# Patient Record
Sex: Female | Born: 1950 | Race: White | Hispanic: No | Smoking: Never smoker
Health system: Southern US, Community
[De-identification: ages and names within clinical notes are randomized; demographics above are authoritative.]

## PROBLEM LIST (undated history)

## (undated) DIAGNOSIS — R569 Unspecified convulsions: Secondary | ICD-10-CM

## (undated) DIAGNOSIS — F319 Bipolar disorder, unspecified: Secondary | ICD-10-CM

## (undated) DIAGNOSIS — R4189 Other symptoms and signs involving cognitive functions and awareness: Secondary | ICD-10-CM

## (undated) DIAGNOSIS — E162 Hypoglycemia, unspecified: Secondary | ICD-10-CM

## (undated) DIAGNOSIS — F1020 Alcohol dependence, uncomplicated: Secondary | ICD-10-CM

## (undated) DIAGNOSIS — Z9289 Personal history of other medical treatment: Secondary | ICD-10-CM

## (undated) DIAGNOSIS — J302 Other seasonal allergic rhinitis: Secondary | ICD-10-CM

## (undated) DIAGNOSIS — Z8711 Personal history of peptic ulcer disease: Secondary | ICD-10-CM

## (undated) DIAGNOSIS — F1027 Alcohol dependence with alcohol-induced persisting dementia: Secondary | ICD-10-CM

## (undated) DIAGNOSIS — F32A Depression, unspecified: Secondary | ICD-10-CM

## (undated) DIAGNOSIS — R413 Other amnesia: Secondary | ICD-10-CM

## (undated) DIAGNOSIS — F10239 Alcohol dependence with withdrawal, unspecified: Secondary | ICD-10-CM

## (undated) DIAGNOSIS — Z9889 Other specified postprocedural states: Secondary | ICD-10-CM

## (undated) DIAGNOSIS — F1011 Alcohol abuse, in remission: Secondary | ICD-10-CM

## (undated) DIAGNOSIS — Z8679 Personal history of other diseases of the circulatory system: Secondary | ICD-10-CM

## (undated) DIAGNOSIS — S0990XA Unspecified injury of head, initial encounter: Secondary | ICD-10-CM

## (undated) DIAGNOSIS — F419 Anxiety disorder, unspecified: Secondary | ICD-10-CM

## (undated) DIAGNOSIS — F10231 Alcohol dependence with withdrawal delirium: Secondary | ICD-10-CM

## (undated) DIAGNOSIS — R55 Syncope and collapse: Secondary | ICD-10-CM

## (undated) DIAGNOSIS — I10 Essential (primary) hypertension: Secondary | ICD-10-CM

## (undated) DIAGNOSIS — Z8719 Personal history of other diseases of the digestive system: Secondary | ICD-10-CM

## (undated) DIAGNOSIS — K439 Ventral hernia without obstruction or gangrene: Secondary | ICD-10-CM

## (undated) DIAGNOSIS — E039 Hypothyroidism, unspecified: Secondary | ICD-10-CM

## (undated) DIAGNOSIS — D649 Anemia, unspecified: Secondary | ICD-10-CM

## (undated) DIAGNOSIS — K254 Chronic or unspecified gastric ulcer with hemorrhage: Secondary | ICD-10-CM

## (undated) DIAGNOSIS — I639 Cerebral infarction, unspecified: Secondary | ICD-10-CM

## (undated) DIAGNOSIS — F329 Major depressive disorder, single episode, unspecified: Secondary | ICD-10-CM

## (undated) DIAGNOSIS — S065XAA Traumatic subdural hemorrhage with loss of consciousness status unknown, initial encounter: Secondary | ICD-10-CM

## (undated) HISTORY — PX: CHOLECYSTECTOMY: SHX55

## (undated) HISTORY — PX: TONSILLECTOMY: SUR1361

## (undated) HISTORY — DX: Unspecified convulsions: R56.9

## (undated) HISTORY — DX: Other amnesia: R41.3

## (undated) HISTORY — PX: CHOLECYSTECTOMY OPEN: SUR202

---
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
## 2001-07-03 ENCOUNTER — Emergency Department: Admit: 2001-07-03 | Payer: Self-pay | Source: Emergency Department | Admitting: Emergency Medicine

## 2001-10-03 ENCOUNTER — Ambulatory Visit: Admit: 2001-10-03 | Disposition: A | Discharge status: Home / Self Care | Payer: Self-pay | Source: Ambulatory Visit

## 2002-08-14 ENCOUNTER — Ambulatory Visit
Admit: 2002-08-14 | Disposition: A | Discharge status: Home / Self Care | Payer: Self-pay | Source: Ambulatory Visit | Admitting: Internal Medicine

## 2003-07-29 ENCOUNTER — Ambulatory Visit
Admit: 2003-07-29 | Disposition: A | Discharge status: Home / Self Care | Payer: Self-pay | Source: Ambulatory Visit | Admitting: Internal Medicine

## 2003-08-13 ENCOUNTER — Ambulatory Visit
Admit: 2003-08-13 | Disposition: A | Discharge status: Home / Self Care | Payer: Self-pay | Source: Ambulatory Visit | Admitting: Internal Medicine

## 2004-02-10 ENCOUNTER — Emergency Department: Admit: 2004-02-10 | Payer: Self-pay | Source: Emergency Department

## 2004-02-10 ENCOUNTER — Inpatient Hospital Stay
Admission: EM | Admit: 2004-02-10 | Disposition: A | Discharge status: Home / Self Care | Payer: Self-pay | Source: Emergency Department | Admitting: Hospitalist

## 2005-05-08 DIAGNOSIS — I639 Cerebral infarction, unspecified: Secondary | ICD-10-CM

## 2005-05-08 HISTORY — DX: Cerebral infarction, unspecified: I63.9

## 2008-06-22 ENCOUNTER — Ambulatory Visit
Admit: 2008-06-22 | Disposition: A | Discharge status: Home / Self Care | Payer: Self-pay | Source: Other Acute Inpatient Hospital | Admitting: Hospice and Palliative Medicine

## 2010-05-08 DIAGNOSIS — F10231 Alcohol dependence with withdrawal delirium: Secondary | ICD-10-CM

## 2010-05-08 DIAGNOSIS — F10931 Alcohol use, unspecified with withdrawal delirium: Secondary | ICD-10-CM

## 2010-05-08 HISTORY — DX: Alcohol use, unspecified with withdrawal delirium: F10.931

## 2010-05-08 HISTORY — DX: Alcohol dependence with withdrawal delirium: F10.231

## 2013-07-06 DIAGNOSIS — Z9889 Other specified postprocedural states: Secondary | ICD-10-CM

## 2013-07-06 HISTORY — DX: Other specified postprocedural states: Z98.890

## 2014-06-30 ENCOUNTER — Inpatient Hospital Stay: Payer: 59 | Admitting: Psychiatry

## 2014-06-30 ENCOUNTER — Inpatient Hospital Stay
Admission: RE | Admit: 2014-06-30 | Discharge: 2014-07-21 | DRG: 897 | Disposition: A | Discharge status: Home / Self Care | Payer: 59 | Source: Ambulatory Visit | Attending: Psychiatry | Admitting: Psychiatry

## 2014-06-30 DIAGNOSIS — Z888 Allergy status to other drugs, medicaments and biological substances status: Secondary | ICD-10-CM

## 2014-06-30 DIAGNOSIS — G4709 Other insomnia: Secondary | ICD-10-CM

## 2014-06-30 DIAGNOSIS — W1830XA Fall on same level, unspecified, initial encounter: Secondary | ICD-10-CM | POA: Diagnosis present

## 2014-06-30 DIAGNOSIS — R4182 Altered mental status, unspecified: Secondary | ICD-10-CM

## 2014-06-30 DIAGNOSIS — K5900 Constipation, unspecified: Secondary | ICD-10-CM

## 2014-06-30 DIAGNOSIS — E663 Overweight: Secondary | ICD-10-CM | POA: Diagnosis present

## 2014-06-30 DIAGNOSIS — F419 Anxiety disorder, unspecified: Secondary | ICD-10-CM | POA: Diagnosis present

## 2014-06-30 DIAGNOSIS — Y939 Activity, unspecified: Secondary | ICD-10-CM

## 2014-06-30 DIAGNOSIS — R52 Pain, unspecified: Secondary | ICD-10-CM

## 2014-06-30 DIAGNOSIS — Z6827 Body mass index (BMI) 27.0-27.9, adult: Secondary | ICD-10-CM

## 2014-06-30 DIAGNOSIS — Z23 Encounter for immunization: Secondary | ICD-10-CM

## 2014-06-30 DIAGNOSIS — Z87891 Personal history of nicotine dependence: Secondary | ICD-10-CM

## 2014-06-30 DIAGNOSIS — Y999 Unspecified external cause status: Secondary | ICD-10-CM

## 2014-06-30 DIAGNOSIS — G9389 Other specified disorders of brain: Secondary | ICD-10-CM | POA: Diagnosis present

## 2014-06-30 DIAGNOSIS — F10231 Alcohol dependence with withdrawal delirium: Principal | ICD-10-CM | POA: Diagnosis present

## 2014-06-30 DIAGNOSIS — Z8673 Personal history of transient ischemic attack (TIA), and cerebral infarction without residual deficits: Secondary | ICD-10-CM

## 2014-06-30 DIAGNOSIS — R112 Nausea with vomiting, unspecified: Secondary | ICD-10-CM

## 2014-06-30 DIAGNOSIS — G40909 Epilepsy, unspecified, not intractable, without status epilepticus: Secondary | ICD-10-CM | POA: Diagnosis present

## 2014-06-30 DIAGNOSIS — R6510 Systemic inflammatory response syndrome (SIRS) of non-infectious origin without acute organ dysfunction: Secondary | ICD-10-CM | POA: Diagnosis present

## 2014-06-30 DIAGNOSIS — F09 Unspecified mental disorder due to known physiological condition: Secondary | ICD-10-CM | POA: Diagnosis present

## 2014-06-30 DIAGNOSIS — S301XXA Contusion of abdominal wall, initial encounter: Secondary | ICD-10-CM | POA: Diagnosis present

## 2014-06-30 DIAGNOSIS — F0280 Dementia in other diseases classified elsewhere without behavioral disturbance: Secondary | ICD-10-CM | POA: Diagnosis present

## 2014-06-30 DIAGNOSIS — F32A Depression, unspecified: Secondary | ICD-10-CM

## 2014-06-30 DIAGNOSIS — I10 Essential (primary) hypertension: Secondary | ICD-10-CM | POA: Diagnosis present

## 2014-06-30 DIAGNOSIS — Z9181 History of falling: Secondary | ICD-10-CM

## 2014-06-30 DIAGNOSIS — F1027 Alcohol dependence with alcohol-induced persisting dementia: Secondary | ICD-10-CM | POA: Diagnosis present

## 2014-06-30 DIAGNOSIS — F319 Bipolar disorder, unspecified: Secondary | ICD-10-CM | POA: Diagnosis present

## 2014-06-30 DIAGNOSIS — F29 Unspecified psychosis not due to a substance or known physiological condition: Secondary | ICD-10-CM | POA: Diagnosis present

## 2014-06-30 DIAGNOSIS — S51812A Laceration without foreign body of left forearm, initial encounter: Secondary | ICD-10-CM | POA: Diagnosis present

## 2014-06-30 DIAGNOSIS — IMO0002 Reserved for concepts with insufficient information to code with codable children: Secondary | ICD-10-CM

## 2014-06-30 DIAGNOSIS — T148XXA Other injury of unspecified body region, initial encounter: Secondary | ICD-10-CM | POA: Diagnosis present

## 2014-06-30 DIAGNOSIS — R197 Diarrhea, unspecified: Secondary | ICD-10-CM

## 2014-06-30 DIAGNOSIS — R29818 Other symptoms and signs involving the nervous system: Secondary | ICD-10-CM

## 2014-06-30 DIAGNOSIS — G4700 Insomnia, unspecified: Secondary | ICD-10-CM | POA: Diagnosis present

## 2014-06-30 DIAGNOSIS — R569 Unspecified convulsions: Secondary | ICD-10-CM | POA: Diagnosis present

## 2014-06-30 DIAGNOSIS — R451 Restlessness and agitation: Secondary | ICD-10-CM | POA: Insufficient documentation

## 2014-06-30 MED ORDER — ESCITALOPRAM OXALATE 20 MG PO TABS
20.0000 mg | ORAL_TABLET | Freq: Every day | ORAL | Status: DC
Start: 2014-07-01 — End: 2014-07-06
  Administered 2014-07-01 – 2014-07-06 (×6): 20 mg via ORAL
  Filled 2014-06-30 (×6): qty 1

## 2014-06-30 MED ORDER — LORAZEPAM 0.5 MG PO TABS
0.5000 mg | ORAL_TABLET | ORAL | Status: DC | PRN
Start: 2014-06-30 — End: 2014-07-21
  Administered 2014-07-07 – 2014-07-21 (×13): 0.5 mg via ORAL
  Filled 2014-06-30 (×13): qty 1

## 2014-06-30 MED ORDER — ACETAMINOPHEN 325 MG PO TABS
650.0000 mg | ORAL_TABLET | Freq: Four times a day (QID) | ORAL | Status: DC | PRN
Start: 2014-06-30 — End: 2014-07-21
  Administered 2014-07-04 – 2014-07-20 (×8): 650 mg via ORAL
  Filled 2014-06-30 (×9): qty 2

## 2014-06-30 MED ORDER — HALOPERIDOL 5 MG PO TABS
5.0000 mg | ORAL_TABLET | ORAL | Status: DC | PRN
Start: 2014-06-30 — End: 2014-07-21
  Administered 2014-07-13: 5 mg via ORAL
  Filled 2014-06-30: qty 1

## 2014-06-30 MED ORDER — TRAZODONE HCL 50 MG PO TABS
50.0000 mg | ORAL_TABLET | Freq: Every evening | ORAL | Status: DC | PRN
Start: 2014-06-30 — End: 2014-07-19
  Administered 2014-07-01 – 2014-07-18 (×12): 50 mg via ORAL
  Filled 2014-06-30 (×12): qty 1

## 2014-06-30 MED ORDER — ZIPRASIDONE MESYLATE 20 MG IM SOLR
20.0000 mg | Freq: Once | INTRAMUSCULAR | Status: DC | PRN
Start: 2014-06-30 — End: 2014-07-21

## 2014-06-30 MED ORDER — HALOPERIDOL LACTATE 5 MG/ML IJ SOLN
5.0000 mg | INTRAMUSCULAR | Status: DC | PRN
Start: 2014-06-30 — End: 2014-07-21
  Administered 2014-06-30: 5 mg via INTRAMUSCULAR
  Filled 2014-06-30: qty 1

## 2014-06-30 MED ORDER — BENZTROPINE MESYLATE 1 MG/ML IJ SOLN
1.0000 mg | INTRAMUSCULAR | Status: DC | PRN
Start: 2014-06-30 — End: 2014-07-21

## 2014-06-30 NOTE — Psych Admission Note (Signed)
PT ARRIVED ESCORTED TO UNIT  BY  POLICE FROM [facility]. PT HAD BEEN  IN PT THERE SINCE 2/16 UNTIL TODAY FOR DELI RUM TREMORS . SHE HAS HISTORY OF ETOH ABUSE , BIPOLAR. HYPERTENSION AND SIRS. PT WAS FOUND WANDERING AROUND HER HOME IN [place] NAKED, INTOXICATED AND SHE HAS AN 6 INCH HEALING WOUND ON LEFT ARM IT IS SCABBED OVER DRY NO REDNESS OR SWELLING. DRY SCARS AND SCABS ON BOTH KNEES WITH BRUISES ON BOTH LEGS . SHE DOES NOT C/O PAIN. SHE IS DISOR INTED ONLY TO SELF/\. BUT SHE CAN RECOUNT DETAILS AROUND ADMISSION TO [facility]. SHE DOES HAVE DIFFICULTY REMEMBERING WHERE HER ROOM IS APPETITE VERY GOOD TAKING FLUIDS WELL.

## 2014-07-01 ENCOUNTER — Encounter: Payer: Self-pay | Admitting: Family

## 2014-07-01 ENCOUNTER — Inpatient Hospital Stay: Payer: 59

## 2014-07-01 DIAGNOSIS — R569 Unspecified convulsions: Secondary | ICD-10-CM | POA: Diagnosis present

## 2014-07-01 DIAGNOSIS — T148XXA Other injury of unspecified body region, initial encounter: Secondary | ICD-10-CM | POA: Diagnosis present

## 2014-07-01 DIAGNOSIS — R451 Restlessness and agitation: Secondary | ICD-10-CM | POA: Insufficient documentation

## 2014-07-01 LAB — CBC AND DIFFERENTIAL
Basophils Absolute Automated: 0.05 10*3/uL (ref 0.00–0.20)
Basophils Automated: 1 %
Eosinophils Absolute Automated: 0.17 10*3/uL (ref 0.00–0.70)
Eosinophils Automated: 3 %
Hematocrit: 38.1 % (ref 37.0–47.0)
Hgb: 12.8 g/dL (ref 12.0–16.0)
Lymphocytes Absolute Automated: 0.86 10*3/uL (ref 0.50–4.40)
Lymphocytes Automated: 16 %
MCH: 34.6 pg — ABNORMAL HIGH (ref 28.0–32.0)
MCHC: 33.6 g/dL (ref 32.0–36.0)
MCV: 103 fL — ABNORMAL HIGH (ref 80.0–100.0)
MPV: 10.3 fL (ref 9.4–12.3)
Monocytes Absolute Automated: 0.9 10*3/uL (ref 0.00–1.20)
Monocytes: 17 %
Neutrophils Absolute: 3.4 10*3/uL (ref 1.80–8.10)
Neutrophils: 63 %
Platelets: 290 10*3/uL (ref 140–400)
RBC: 3.7 10*6/uL — ABNORMAL LOW (ref 4.20–5.40)
RDW: 12 % (ref 12–15)
WBC: 5.38 10*3/uL (ref 3.50–10.80)

## 2014-07-01 LAB — COMPREHENSIVE METABOLIC PANEL
ALT: 42 U/L (ref 0–55)
AST (SGOT): 36 U/L — ABNORMAL HIGH (ref 5–34)
Albumin/Globulin Ratio: 1.3 (ref 0.9–2.2)
Albumin: 4.1 g/dL (ref 3.5–5.0)
Alkaline Phosphatase: 52 U/L (ref 37–106)
Anion Gap: 10 (ref 5.0–15.0)
BUN: 13 mg/dL (ref 7.0–19.0)
Bilirubin, Total: 0.3 mg/dL (ref 0.2–1.2)
CO2: 24 mEq/L (ref 22–29)
Calcium: 10.3 mg/dL (ref 8.5–10.5)
Chloride: 106 mEq/L (ref 100–111)
Creatinine: 0.8 mg/dL (ref 0.6–1.0)
Globulin: 3.2 g/dL (ref 2.0–3.6)
Glucose: 99 mg/dL (ref 70–100)
Potassium: 4.2 mEq/L (ref 3.5–5.1)
Protein, Total: 7.3 g/dL (ref 6.0–8.3)
Sodium: 140 mEq/L (ref 136–145)

## 2014-07-01 LAB — GFR: EGFR: >60

## 2014-07-01 MED ORDER — FOLIC ACID 1 MG PO TABS
1.0000 mg | ORAL_TABLET | Freq: Every day | ORAL | Status: DC
Start: 2014-07-03 — End: 2014-07-21
  Administered 2014-07-03 – 2014-07-21 (×19): 1 mg via ORAL
  Filled 2014-07-01 (×19): qty 1

## 2014-07-01 MED ORDER — LEVETIRACETAM 500 MG PO TABS
500.0000 mg | ORAL_TABLET | Freq: Two times a day (BID) | ORAL | Status: DC
Start: 2014-07-01 — End: 2014-07-21
  Administered 2014-07-01 – 2014-07-21 (×41): 500 mg via ORAL
  Filled 2014-07-01 (×41): qty 1

## 2014-07-01 MED ORDER — TETANUS-DIPHTH-ACELL PERTUSSIS 5-2.5-18.5 LF-MCG/0.5 IM SUSP
0.5000 mL | INTRAMUSCULAR | Status: AC | PRN
Start: 2014-07-01 — End: 2014-07-20
  Administered 2014-07-20: 0.5 mL via INTRAMUSCULAR
  Filled 2014-07-01: qty 0.5

## 2014-07-01 MED ORDER — TAB-A-VITE/BETA CAROTENE PO TABS
1.0000 | ORAL_TABLET | Freq: Every day | ORAL | Status: DC
Start: 2014-07-03 — End: 2014-07-21
  Administered 2014-07-03 – 2014-07-21 (×19): 1 via ORAL
  Filled 2014-07-01 (×19): qty 1

## 2014-07-01 MED ORDER — THIAMINE HCL 100 MG PO TABS
100.0000 mg | ORAL_TABLET | Freq: Every day | ORAL | Status: DC
Start: 2014-07-03 — End: 2014-07-21
  Administered 2014-07-03 – 2014-07-21 (×19): 100 mg via ORAL
  Filled 2014-07-01 (×19): qty 1

## 2014-07-01 MED ORDER — MUPIROCIN 2 % EX OINT
TOPICAL_OINTMENT | Freq: Three times a day (TID) | CUTANEOUS | Status: DC
Start: 2014-07-01 — End: 2014-07-04
  Filled 2014-07-01: qty 22

## 2014-07-01 NOTE — Consults (Signed)
ADMISSION HISTORY AND PHYSICAL EXAM    Date Time: 07/01/2014 1:22 PM  Patient Name: Virginia Reid  Attending Physician: Roxy Manns, MD        CC   Medical consult for LAMPS admission        Assessment / Plan:   -AMS admitted to [facility] from [facility]; review of medical records; patient naked in the cold weather.  Patient has no memory of this; check head ct, labs; no gross neuro deficits however cognitively her thought process is impaired.  Check rpr, vit Reid, folate.  Cbc/chem panel pending  -s/p fall patient remembers falling outside at same point with linear cut to left FA now with escar tissue present.  No active d/c, red streaking, odor.  Wash daily and apply bactroban.  Tetanus updated.  -seizure history; no clear etiology con't keppra bid  -reported history of alcohol abuse in past; vitamins, folic and thiamine.    -qtc prolongation at 467; caution with meds interfering with qtc, check k/mg level.     I spoke briefly with son, dniya neuhaus today at [phone] however he was not able to talk 2/2 being in a meeting; will call back later.           History of Present Illness:   Virginia Reid is a 64 y.o. female taken to a [facility] and transferred to [facility] for further eval and management.  All the facts are not clear; hx alcohol issues on keppra.  Pt reports sz years ago not related to alcohol. In discussion she reprots feeling sad 2/2 death of her twin sister recently however she did not know where she lived or the last time she saw her sister.      ROS: limited as above; no cp, sob, gait disturbance.      Past Medical History:   History reviewed. No pertinent past medical history.    Past Surgical History:   History reviewed. No pertinent past surgical history.    Family History:   History reviewed. No pertinent family history.    Social History:     History     Social History   . Marital Status: Widowed     Spouse Name: N/A     Number of Children: N/A   . Years of Education: N/A      Social History Main Topics   . Smoking status: Former Games developer   . Smokeless tobacco: Not on file   . Alcohol Use: Yes   . Drug Use: No   . Sexual Activity: Not on file     Other Topics Concern   . Not on file     Social History Narrative   . No narrative on file       Allergies:     Allergies   Allergen Reactions   . Zoloft [Sertraline] Anxiety       Medications:   There are no discharge medications for this patient.      No prescriptions prior to admission         Physical Exam:     Filed Vitals:    07/01/14 0606   BP: 113/77   Pulse: 84   Temp: 97.7 F (36.5 C)   Resp:    SpO2: 98%       Intake and Output Summary (Last 24 hours) at Date Time  No intake or output data in the 24 hours ending 07/01/14 1322    General appearance - alert, talkative female nad  Mental status - alert, oriented to person, place, and time  Eyes - pupils equal and reactive,  sclera anicteric  Nose - normal and patent, no erythema, discharge or polyps  Mouth - mucous membranes moist, pharynx normal without lesions   Neck - supple, no significant adenopathy,  no bruits, thyroid exam: thyroid is normal in size without nodules or tenderness,  Chest - clear to auscultation, no wheezes, rales or rhonchi, symmetric air entry  Heart - normal rate, regular rhythm, normal S1, S2  Abdomen - soft, nontender, nondistended, +BS  Back exam -  no tenderness, palpable spasm or pain on motion  Neurological - normal speech, no focal findings or movement disorder noted,  motor and sensory grossly normal bilaterally , no tremors, strength 5/5  Musculoskeletal - no joint tenderness, deformity or swelling, full range of motion without pain  Extremities - peripheral pulses normal, no pedal edema  Skin - linear cut with escar tissue now approx 15 cm to left fa      Labs:     Results     Procedure Component Value Units Date/Time    CBC and differential [[id number]]  (Abnormal) Collected:  07/01/14 1118    Specimen Information:  Blood / Blood Updated:  07/01/14  1211     WBC 5.38 x10 3/uL      Hgb 12.8 g/dL      Hematocrit 40.9 %      Platelets 290 x10 3/uL      RBC 3.70 (L) x10 6/uL      MCV 103.0 (H) fL      MCH 34.6 (H) pg      MCHC 33.6 g/dL      RDW 12 %      MPV 10.3 fL      Neutrophils 63 %      Lymphocytes Automated 16 %      Monocytes 17 %      Eosinophils Automated 3 %      Basophils Automated 1 %      Immature Granulocyte Unmeasured %      Nucleated RBC Unmeasured /100 WBC      Neutrophils Absolute 3.40 x10 3/uL      Abs Lymph Automated 0.86 x10 3/uL      Abs Mono Automated 0.90 x10 3/uL      Abs Eos Automated 0.17 x10 3/uL      Absolute Baso Automated 0.05 x10 3/uL      Absolute Immature Granulocyte Unmeasured x10 3/uL     Comprehensive metabolic panel [[id number]]  (Abnormal) Collected:  07/01/14 1118    Specimen Information:  Blood Updated:  07/01/14 1152     Glucose 99 mg/dL      BUN 95.6 mg/dL      Creatinine 0.8 mg/dL      Sodium 213 mEq/L      Potassium 4.2 mEq/L      Chloride 106 mEq/L      CO2 24 mEq/L      CALCIUM 10.3 mg/dL      Protein, Total 7.3 g/dL      Albumin 4.1 g/dL      AST (SGOT) 36 (H) U/L      ALT 42 U/L      Alkaline Phosphatase 52 U/L      Bilirubin, Total 0.3 mg/dL      Globulin 3.2 g/dL      Albumin/Globulin Ratio 1.3      Anion Gap 10.0     GFR [[id number]]  Collected:  07/01/14 1118     EGFR >60.0 Updated:  07/01/14 1152          EKG sr with qtc 467    Rads:   Radiological Procedure reviewed.    Signed by: Delle Reining NP  07/01/2014 1:22 PM

## 2014-07-01 NOTE — Plan of Care (Signed)
Problem: Safety  Goal: Patient will be free from injury during hospitalization  Intervention: Provide and maintain safe environment  Patient remained in bed through the early evening, after IM Haldol was given for her restlessness, and confusion.  Patient was calm, slightly guarded on approach.  Refused snacks, stated that she did eat her evening dinner tray.  She was alert to name only, unable to state her location, but did tell me she was from [facility], and lives alone.  Denies drinking prior to admission, although hospitalized for Alcohol Intoxication at [facility] prior to arrival for Inpatient psych tx.  Remained quiet, in bed.  Denies SI/HI, and denying hallucinations.  Refused sleep aids prn.  Will continue to monitor her safety, and progression.  Patient appeared to sleep 9 hours.

## 2014-07-01 NOTE — Psych Admission Note (Signed)
Patient Type: I     ATTENDING PHYSICIAN: Alanda Amass, MD     CHIEF COMPLAINT:  Continued delirium following delirium tremens.     HISTORY OF PRESENT ILLNESS:  The patient is a 64 year old widowed female escorted to the LAMPS unit by  police from [facility].  The patient had been admitted to the  inpatient facility since June 23, 2014 because of delirium tremens.   She has a history of alcohol abuse, bipolar disorder, hypertension, and  SIRS.  She was found wandering around her home in [place] naked,  intoxicated.  At the time of admission, she was disoriented.  She was  oriented only to self.  She was able to recount details surrounding the  admission to [facility].  The patient was admitted to the LAMPS unit  with minimal information.  She was cooperative throughout the evening  shift.  When I saw the patient on the morning of July 01, 2013.  She  was pleasant and appeared calm and comfortable.  She explained she had come  from [facility], which is apparently another name for [facility].  She explained that she was taken to the [facility] because  she was walking around her house and someone called the hospital.  She was  there for 1 to 2 days and did not know why she was here.  She expressed  that she was somewhere in [facility].  She did not know this was  a psychiatric unit.  She thought the month was October, the season fall,  the year 2015.  She explained that she lives in [place] at [address], [address].  She explained she has never been in a psychiatric hospital  before.       Regarding current symptoms, she explained that she usually sleeps pretty  well.  Sometimes she has a little trouble falling asleep and wakes up  occasionally during the night.  She denies feeling depression,  hallucinations, delusions, paranoia.  Her mood occasionally changes but not  rapidly.  She feels she is able to stay focused.  She did explain that her  twin sister had  died within the past week and she has not had a chance to  see the body.      PAST PSYCHIATRIC HISTORY:  She denies.       HISTORY OF SUBSTANCE USE AND ABUSE:    She admits to using a large amount of alcohol in the past, though she would  not be specific about how much it is.  She explained that currently she  drinks beer and wine, maybe 1 glass of wine and 1 glass of beer.  She noted  she drinks a lot less than she used to.  She denied use of illicit drugs.     PAST MEDICAL HISTORY:  She denies.     PAST SURGICAL HISTORY:  She denies.     SOCIAL HISTORY:  She explained that her husband died 58 to 15 years ago; he committed  suicide.  Following that, she decided that she would get her doctorate in  behavioral disturbances.  She got that at [facility] about 10 years ago.   She has a master's degrees from [employer].  According to a medical  record that came with her, she has functioned as principal in the past.   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  She  currently lives by herself.  She thinks she supports herself from money  from her husband and plans to get a job after discharge.  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  She expressed she has a son, Mardelle Matte.  The phone number she gave for  Mardelle Matte is [phone].  When this number was checked it was found to be no  longer operating.  The correct number for Mardelle Matte is [phone].  She also  has a son, Ivin Booty, who is in [place].     MEDICAL HISTORY:  Negative for medical workup at [facility]; it did not come with the  patient today.  We do not have that information.     PHYSICAL EXAMINATION:  VITAL SIGNS:  See RN assessment reviewed by me.  MUSCULOSKELETAL:  The patient walked very well.  She demonstrated full  range of motion with no choreoathetoid movements, akathisia or tics.     MENTAL STATUS EXAMINATION:  She presented as a well-developed, well-nourished  woman who appeared older  than her stated age.  She was fairly well groomed and demonstrated fair  hygiene.   She was alert, cooperative, made intermittent eye contact and  seemed to maintain interest in the interview.  She demonstrated no  psychomotor agitation, some retardation, no movement problems.  Her speech  was rambling and tangential.  Her affect was somewhat empty.  There was an  emptiness even when she spoke about the loss of her sister.  She did not  seem concerned about her confusion regarding where she is or the death of  her sister.  She denied hallucinations, delusions, paranoia.  She did not  appear to be responding to stimuli, but she did appear confused but not too  troubled by that.  Insight, motivation, and judgment, use of language, fund  of knowledge, difficult to assess.       ASSESSMENT:   AXIS I:  Unspecified neurocognitive disorder, history of unspecified  bipolar and related disorder, history of unspecified alcohol-related  disorder with recent delirium tremens.  AXIS II:  Deferred.  AXIS III:  We are attempting to get information regarding this.  See HPI  from Dimas Aguas, nurse practitioner.  AXIS IV:  Problems with chronic psychiatric illness, altered mental status,  history of alcohol abuse and delirium tremens, lack of social support,  confusion.  AXIS V:  Global assessment of functioning of 15.      PLAN:  The patient is admitted to the [place] LAMPS unit on a TDO for basis for  stabilization of mood, diagnostic assessment and safety.  The patient will  participate in the supportive therapeutic milieu.  A medical consult will  be obtained to address any acute or chronic medical issues.  We will  monitor the patient carefully before initiating standing doses of  psychotropic medication.  The patient will meet with case management team  to prepare a safe discharge.     Expected length of stay is 3 to 5 days.     Total time spent with patient 50 minutes of which at least half was  spent  in the coordination of care and counseling, including talking with the  patient, reviewing records, and talking with staff.           D:  07/01/2014 19:01 PM by Dr. Harriett Sine C. Franchot Erichsen, MD ([id number])  T:  07/01/2014 20:04 PM by NTS      Everlean Cherry: [id number]) (Doc ID:  [id number])

## 2014-07-01 NOTE — Plan of Care (Signed)
Problem: Psychosocial and Spiritual Needs  Goal: Demonstrates ability to cope with hospitalization/illness  Outcome: Progressing  Intervention: Encourage verbalization of feelings/concerns/expectations  Patient presented cooperative, with stable mood, smiling. Steady gate. Independent. Denied SI/HI/AVH. Stated that she wishes she could go home .attended groups, per therapist , she had disorganized thought process. Took shower in the am, alternating between her room and lobby, social with peers. Took medication with no difficulty. Will continue to monitor.    No PRN medication requested or given.

## 2014-07-02 LAB — FOLATE: Folate: 17 ng/mL

## 2014-07-02 LAB — VITAMIN B12: Vitamin B-12: 638 pg/mL (ref 211–911)

## 2014-07-02 LAB — MAGNESIUM: Magnesium: 2.5 mg/dL (ref 1.6–2.6)

## 2014-07-02 LAB — HEMOLYSIS INDEX: Hemolysis Index: 5 (ref 0–18)

## 2014-07-02 NOTE — Progress Notes (Signed)
Pt attended all groups during the day. Her affect was blunted; thought process was WNL, at times confused. She kept introducing self to the therapist.    During the creative therapy. Pt remained attentive. She accomplished the group task independently, contributed to the group discussion, and provided feedback.

## 2014-07-02 NOTE — Progress Notes (Signed)
Virginia Reid CSN:[id number],MRN:[id number] is a 64 y.o. female,  Outpatient Primary MD for the patient is No primary care provider on file.   No chief complaint on file.     Assessment & Plan     -AMS head ct no acute issues.  Labs overall negative.  Awaiting rpr, vit, folate.  Telephone call with son, Virginia Reid, reported POA.  Hx chronic drinking, passing out, falling, hitting head, hx tia in past.  Overall memory issues noticed about 2 years ago; last 6-9 month more severe.  Seen by neurology; concern for dementia.  Reports 15 hospital admissions in the last 18 months all around alcoholism.  -chronic alcoholism with hx dt; seen neuro at [facility]; mvi, folate. con't keppra    ?inability to care for self; at present living by self.  Her prior poa, twin sister, deceased recently.  Son Virginia Reid he is now poa; will  check paperwork.         Subjective:   Virginia Reid today has, No headache, No chest pain, No abdominal pain - No Nausea, No new weakness tingling or numbness, No Cough - SOB. "I am ready to go home"  Objective:     Filed Vitals:    06/30/14 1917 07/01/14 0606 07/02/14 0618   BP: 135/88 113/77 110/67   Pulse: 97 84 83   Temp: 96.8 F (36 C) 97.7 F (36.5 C) 98.2 F (36.8 C)   TempSrc: Temporal Artery Oral Oral   Resp: 16     Height: 1.549 m (5' 0.98")     Weight: 66.679 kg (147 lb)     SpO2: 98% 98% 97%     Wt Readings from Last 3 Encounters:   06/30/14 66.679 kg (147 lb)     No intake or output data in the 24 hours ending 07/02/14 0913  Exam  Awake Alert, Oriented x 3   HEENT nl  Lungs CTA  s1s2 no murmur  +ve B.Sounds, Abd Soft, Non tender , No rebound -guarding or rigidity.  No pedal edema  Left FA cut healing ; no secondary infection   Neuro grossly intact     Data Review     Results     Procedure Component Value Units Date/Time    Magnesium [[id number]] Collected:  07/02/14 0550    Specimen Information:  Blood Updated:  07/02/14 0631     Magnesium 2.5 mg/dL     Folate [[id number]] Collected:   07/02/14 0550    Specimen Information:  Blood Updated:  07/02/14 0550    Vitamin B12 [[id number]] Collected:  07/02/14 0550    Specimen Information:  Blood Updated:  07/02/14 0550    RPR [[id number]] Collected:  07/02/14 0550    Specimen Information:  Blood Updated:  07/02/14 0550    CBC and differential [[id number]]  (Abnormal) Collected:  07/01/14 1118    Specimen Information:  Blood / Blood Updated:  07/01/14 1211     WBC 5.38 x10 3/uL      Hgb 12.8 g/dL      Hematocrit 24.4 %      Platelets 290 x10 3/uL      RBC 3.70 (L) x10 6/uL      MCV 103.0 (H) fL      MCH 34.6 (H) pg      MCHC 33.6 g/dL      RDW 12 %      MPV 10.3 fL      Neutrophils 63 %      Lymphocytes Automated 16 %  Monocytes 17 %      Eosinophils Automated 3 %      Basophils Automated 1 %      Immature Granulocyte Unmeasured %      Nucleated RBC Unmeasured /100 WBC      Neutrophils Absolute 3.40 x10 3/uL      Abs Lymph Automated 0.86 x10 3/uL      Abs Mono Automated 0.90 x10 3/uL      Abs Eos Automated 0.17 x10 3/uL      Absolute Baso Automated 0.05 x10 3/uL      Absolute Immature Granulocyte Unmeasured x10 3/uL     Comprehensive metabolic panel [[id number]]  (Abnormal) Collected:  07/01/14 1118    Specimen Information:  Blood Updated:  07/01/14 1152     Glucose 99 mg/dL      BUN 11.9 mg/dL      Creatinine 0.8 mg/dL      Sodium 147 mEq/L      Potassium 4.2 mEq/L      Chloride 106 mEq/L      CO2 24 mEq/L      CALCIUM 10.3 mg/dL      Protein, Total 7.3 g/dL      Albumin 4.1 g/dL      AST (SGOT) 36 (H) U/L      ALT 42 U/L      Alkaline Phosphatase 52 U/L      Bilirubin, Total 0.3 mg/dL      Globulin 3.2 g/dL      Albumin/Globulin Ratio 1.3      Anion Gap 10.0     GFR [[id number]] Collected:  07/01/14 1118     EGFR >60.0 Updated:  07/01/14 1152         ------------------------------------------------------------------------------------------------------------------        Cardiac Enzymes       ------------------------------------------------------------------------------------------------------------------      Micro Results  @[email or web address] @  Radiology Reports  Ct Head Wo Contrast    07/01/2014   History: Altered mental status.  FINDINGS: Brain CT without intravenous contrast. Correlation with a brain MRI dated February 11, 2004.  There is again diffuse parenchymal volume loss. There is ventriculomegaly which is slightly out of proportion to volume loss. The ventriculomegaly involving also have slightly progressed since the prior examination. There is hypodensity in the supratentorial white matter consistent with moderate chronic small vessel ischemic disease, also slightly more prominent than previously. There is again hypodensity in the subinsular regions, particularly on the left, chronic small vessel ischemic in nature as well. There is no mass effect, acute intracranial hemorrhage. The ventricular system and cisterns are normally configured. The visualized paranasal sinuses and calvarium are unremarkable.     07/01/2014    1. 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 2. Volume loss, ventriculomegaly which is slightly out of proportion to volume loss. This has progressed since the prior examination.  Terrilee Croak, MD  07/01/2014 4:36 PM     Scheduled Meds:  Current Facility-Administered Medications   Medication Dose Route Frequency   . escitalopram  20 mg Oral Daily   . [START ON 07/03/2014] folic acid  1 mg Oral Daily   . levETIRAcetam  500 mg Oral Q12H SCH   . [START ON 07/03/2014] multivitamin  1 tablet Oral Daily   . mupirocin   Topical Q8H SCH   . [START ON 07/03/2014] thiamine  100 mg Oral Daily     Continuous Infusions:   PRN Meds:.acetaminophen, benztropine mesylate, haloperidol,  haloperidol lactate, LORazepam, TdaP Booster, traZODone, ziprasidone      Delle Reining NP on 07/02/2014 at 9:13 AM

## 2014-07-02 NOTE — Progress Notes (Signed)
July 02, 2014     SUBJECTIVE AND OBJECTIVE:  According to night shift, the patient was somewhat isolated in her room and  appeared somewhat confused in assessment.  She did talk about her twin  sisters having died and her not being able to go to the funeral.  She spoke  of wanting to be discharged and wanting to go home.  No problem behavior  has been noted.  On the day shift, the patient participated in her  treatment planning conference.  She was pleasant, friendly, and  cooperative.  On the night of June 30, 2013, I spoke with the patient's  son, Virginia Reid, on the telephone.  I asked for history.  He explained that the  patient had never had psychiatric problems and had obtained a Ph.D. level  of education.  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  Virginia Reid explained that his father had committed suicide in  1998.  He shot himself.  The patient raised her 2 boys, who at that time  were 102 and 63 years of age.  The patient had began to have a serious  alcohol addiction problem in her 52s, which became worse after the death of  her husband.  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  Her last job stopped in 2006 because of her inability to stop  drinking.  She continues to drink from the start of the day until bedtime.   This has been going on for the past 3 years.  She has had multiple  hospitalizations for these problems.  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  He reported currently working on finding  assisted living placement for her.  He is having problem with TDO and  getting result in her TDO assessment on Friday, July 03, 2014.  He  explained that her twin sister, Virginia Reid, died a week before.  She was also an  alcohol abuser who had kidney and liver function problems  because of it.   Her death was related to alcohol problems, although associated with a  respiratory infection.  The patient's sister was her power of attorney.   The patient does have a friend, Virginia Reid, who was happy to be at the  service.  His phone number is [phone].      PSYCHIATRIC SPECIALTY EXAMINATION:  VITAL SIGNS:  See RN assessment reviewed by me.  MUSCULOSKELETAL:  She demonstrated full range of motion with no  choreoathetoid movements, akathisia, or tics.     MENTAL STATUS EXAMINATION:  She presented as a well-developed, well-nourished woman who appeared older  than her stated age.  She was fairly well groomed and demonstrated fair  hygiene.  She is alert, cooperative, made intermittent eye contact and  maintained interest in the interaction in the treatment planning  conference.  She demonstrated no psychomotor agitation, retardation, or  movement problems.  Her speech was minimal with short answers to questions  asked.  She was oriented to self, but not to place or situation.  Insight,  motivation, and judgment, use of language, fund of knowledge fair.     ASSESSMENT:  AXIS I:  Unspecified neurocognitive disorder, history of unspecified  alcohol-related disorder with recent delirium tremens.     PLAN:  We will continue  treatment program as outlined above.  We will await  results of the patient's hearing with the special magistrate on Friday.     Estimated floor time, 25 minutes, of which at least half was spent in  coordination of care and counseling, including talking with patient,  reviewing records, and talking with staff.

## 2014-07-02 NOTE — Plan of Care (Signed)
Problem: Safety  Goal: Patient will be free from injury during hospitalization  Intervention: Provide and maintain safe environment  The patient isolated to her room the entire shift and appeared confused on assessment.  The patient stated that she would like to be discharged and go home.  She was reminded that this was only her second day here and she was admitted by order of the court as a TDO.  She also was reminded that she was treated for alcohol withdrawal for a week at [facility].  The patient appeared confused and had a noticeable lapse in memory.  She talked about her twin sister dying and not being able to go to her funeral.  This has not been verified.  She also states that she is "not getting anything out of being here."  The patient received her scheduled evening medications along with PRN Trazodone for sleep.  She rested in her room until she fell asleep.  She will continue to be monitored throughout the evening for progress and safety.     PRN  Trazodone 50 mg @ 2212.

## 2014-07-02 NOTE — Plan of Care (Signed)
Problem: Loss of functioning (Thought Disorder, Mood Disturbance and/or Severe Anxiety) AS EVIDENCED BY...  Goal: Will remain safe during hospitalization  Outcome: Progressing  The patient has remained safe during her hospitalization.  She has been pleasant and cooperative; however, she is confused.  She has been taking direction from a peer who is advising her to meet with the case manager for advice and recommendations on finding a job.  In the afternoon, she didn't remember meeting with her psychiatrist during treatment team.  No behavioral issues or PRN's necessary.  Will continue to monitor.

## 2014-07-03 DIAGNOSIS — IMO0002 Reserved for concepts with insufficient information to code with codable children: Secondary | ICD-10-CM

## 2014-07-03 DIAGNOSIS — R451 Restlessness and agitation: Secondary | ICD-10-CM

## 2014-07-03 DIAGNOSIS — R4182 Altered mental status, unspecified: Secondary | ICD-10-CM

## 2014-07-03 LAB — RPR: RPR: NONREACTIVE

## 2014-07-03 MED ORDER — MUPIROCIN 2 % EX OINT
TOPICAL_OINTMENT | Freq: Three times a day (TID) | CUTANEOUS | Status: DC
Start: 2014-07-03 — End: 2014-07-21

## 2014-07-03 MED ORDER — LEVETIRACETAM 500 MG PO TABS
500.0000 mg | ORAL_TABLET | Freq: Two times a day (BID) | ORAL | Status: DC
Start: 2014-07-03 — End: 2014-07-21

## 2014-07-03 MED ORDER — TRAZODONE HCL 50 MG PO TABS
50.0000 mg | ORAL_TABLET | Freq: Every evening | ORAL | Status: DC | PRN
Start: 2014-07-03 — End: 2014-08-25

## 2014-07-03 NOTE — BH Treatment Providers (Signed)
Case Management Initial Discharge Planning Assessment     Psychosocial/Demographic Information   Name of interviewee/s:  Virginia Reid   Orientation and decision making abilities of patient (ie a&ox3 able to make decisions, demented patient, patient on vent, etc)   A&Ox1 name only   Does the patient have an Advance Directive? Location? (home/on chart, if home-advised to bring in copy?) <no information>  Advance Directive: Patient does not have advance directive]   Healthcare Decision Maker (HDM) (if other than the patient) Include relationship and contact information.  Patient   Any additional emergency contacts? No emergency contact information on file.   Pt lives with:  Living Arrangements: Alone]   Type of residence where patient lives:    ]shelter   ]   ]   ]   ]   Prior level of functioning (ambulation & ADL's)  Good w/ADL's   Support system-list  (i.e.church, friends, extended family, friends?)       Do you want to designate an individual who will care for or assist you upon discharge?    If yes: Please list the name, relationship, phone number, and address of the designated individual. Name:  Relationship:  Phone Number:  Address:       Correct Insurance listed on face sheet - verified with the patient/HDM        Discharge Planning Services in Place  Current LACE score?    Name of Primary Care Physician verified in patient banner (update in patient banner if not listed) No primary care provider on file.  None   PCP Follow up apptmt offered/set up n/a   What DME does the patient currently own? (rolling walker, hospital bed, home O2, BiPAP/CPAP, bedside commode, cane, hoyer lift)   n/a   Are PT/OT services indicated? If so, has it been ordered?  n/a   Has the patient been to an Acute Rehab or SNF in the past?  If so, where?   n/a   Does the patient currently have home health or hospice/palliative services in place?  If so, list agency name.   n/a   Does the patient already have community dialysis set up?  If  so, where? n/a      Readmission Assessment  Is this patient an inpatient to inpatient 30 day readmission?  no   Previous admission discharge diagnosis     Was patient readmitted from a facility?        Patient active with Home Health?        Patient active with Home Hospice?       Contributing factors to readmission (i.e., no follow up appt on previous d/c, unable to get meds, no insurance, no social support, etc.)    Did patient/family understand what medication was for and how to administer, symptoms to indicate worsening condition, activity and diet restrictions at time of previous d/c?               Anticipated Discharge Plan  Anticipated Disposition: Option A  Discharge to shelter   Anticipated Disposition: Option B     Who will transport the patient upon discharge?  taxi   If applicable, were SNF or Hospice choices provided?     Palliative Care Consult needed? (if yes, contact attending MD)                IInpatient Medicare/Medicare HMO Patients Only  Was an initial IMM signed within 24 hours of admission?  (Look in Media Tab, Documents Table or  Shadow Chart)  n/a

## 2014-07-03 NOTE — Plan of Care (Signed)
Problem: Loss of functioning (Thought Disorder, Mood Disturbance and/or Severe Anxiety) AS EVIDENCED BY...  Goal: Conducts goal directed, coherent conversation  Outcome: Progressing  The patient can conduct a goal directed, coherent conversation; however, she is confused.  She is trying to make plans to get a job after discharge.  She has no insight into her dementia.  She was committed by the Special Justice during her hearing.  She is pleasant and cooperative.

## 2014-07-03 NOTE — Plan of Care (Signed)
Problem: Safety  Goal: Patient will be free from injury during hospitalization  Intervention: Hourly rounding.  Pt denies HI, SI, AVH. Pt sat out in the lobby socializing with selected peer. Pt was friendly and cooperative with a slowed behavior. Pt took her medication w/o any difficulty. Pt refused any PRN sleeping meds. Pt reported she sleeps just fine w/o them. Pt slept 7 hours.

## 2014-07-04 LAB — GLUCOSE WHOLE BLOOD - POCT: Whole Blood Glucose POCT: 88 mg/dL (ref 70–100)

## 2014-07-04 MED ORDER — BACITRACIN 500 UNIT/GM EX OINT
TOPICAL_OINTMENT | Freq: Two times a day (BID) | CUTANEOUS | Status: DC | PRN
Start: 2014-07-04 — End: 2014-07-04

## 2014-07-04 MED ORDER — MUPIROCIN 2 % EX OINT
TOPICAL_OINTMENT | Freq: Three times a day (TID) | CUTANEOUS | Status: DC
Start: 2014-07-04 — End: 2014-07-05
  Filled 2014-07-04: qty 22

## 2014-07-04 NOTE — Progress Notes (Signed)
Pt attended community meeting this AM and was oriented x4. Pt's stated goal: to discharge from unit.  Pt attended creative therapy group and was able to identify several personal positive attributes and a goal to reach by December 2016.

## 2014-07-04 NOTE — Plan of Care (Addendum)
Problem: Safety  Goal: Patient will be free from injury during hospitalization  Outcome: Progressing  Pt was isolated in her room for entire shift. Denies SI/HI/AVH. Pleasant upon approach. Reported high anxiety and depression. Presents with flat affect, and depressed mood. Seems confused, told about her husband that committed Suicide 10 years ago, asked about her sister that she doesn't know is she died or no? She wishes that she could go home.This Clinical research associate noticed a lot of bruises at pt's stomach. When this writer asked where these bruises came from? She did not know about it. Compliant with medications, administered as per MD orders. Q15 minute checks, brief 1:1 contacts, monitored for safety. Was not alert about date and weekday.Will continue to monitor.   Pt appeared to sleep 6 Hours   No PRN

## 2014-07-04 NOTE — Progress Notes (Signed)
July 03, 2014.      SUBJECTIVE AND OBJECTIVE:  According to night shift, the patient was comfortable.  She spent time in  the lobby socializing with a selective peer.  She was smiling and  cooperative with somewhat slower behavior.  She refused p.r.n. sleep  medications but slept about 7 hours without them.  The patient participated  in her commitment hearing during the day shift, I was there for part of the  hearing testifying regarding her regarding her ability to care for self,  but need for constant supervision.  I do not feel that she requires  admission to a locked psychiatric unit; however, I do feel she needs during  a situation which she has supervised.  Her case manager has been working  with the patient's family to encourage a way of finding proper placement.   The decision of the special magistrate was to comit to the patient to up to  30 days.  The patient was pleasant and cooperative, quiet while I was  present in the meeting.     PSYCHIATRIC SPECIALTY EXAMINATION:  VITAL SIGNS:  See RN assessment reviewed by me.  MUSCULOSKELETAL:  She demonstrated full range of motion with no  choreoathetoid movements akathisia or tics.      MENTAL STATUS EXAMINATION:  She presented as a well-developed, well-nourished woman who appeared to be  her stated age.  She was fairly well groomed and demonstrated good hygiene.   She was alert, cooperative, made intermittent eye contact with people  in  the meeting.  She demonstrated mild psychomotor retardation without  agitation or movement problems.  Her speech was limited in the meeting, she  demonstrates some confusion and memory problems.  Insight, motivation, and  judgment, use of language, fund of knowledge poor.     ASSESSMENT:  Unspecified neurocognitive disorder, history of unspecified alcohol-related  disorder with recent delirium tremens.     PLAN:  We will continue patient's treatment plan as noted above.  We will continue  to encourage family to find a proper  placement, our case manager continues  to work on this as well.     Estimated floor time 35 minutes of which at least half was spent in  coordination of care and counseling, which included talking with the  patient, reviewing of records, participating in the patient's commitment  hearing.

## 2014-07-04 NOTE — Plan of Care (Signed)
Problem: Loss of functioning (Thought Disorder, Mood Disturbance and/or Severe Anxiety) AS EVIDENCED BY...  Goal: Reports improved mood  Outcome: Progressing  The patient's mood is calm and cooperative.  She has been asking about discharge.  She is very forgetful and has asked the same question numerous times.  She denies SI/HI and hallucinations.

## 2014-07-05 NOTE — Plan of Care (Signed)
Problem: Safety  Goal: Patient will be free from injury during hospitalization  Outcome: Progressing  The patient presents with a flat affect.She attended and participated in one group this shift. She has spent most of the time in her room.Took all of her meds without difficulty.The patient has a poor memory.She frequently repeats the same questions.When reminded she asked that question already she gives you a surprised look.Plesant and cooperative in her interactions with staff.

## 2014-07-05 NOTE — Progress Notes (Addendum)
Loyda Severin CSN:[id number],MRN:[id number] is a 64 y.o. female,  Outpatient Primary MD for the patient is No primary care provider on file.   No chief complaint on file.     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  Labs overall negative.  Negative RPR & vitamin B12, folate are normal.   Hx chronic alcohol drinking, passing out, falling, hitting head, hx tia in past.       -chronic alcoholism with hx DT. seen neuro at [facility]; mvi, folate. con't keppra. 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 Discontinue Bactroban        Subjective:   Alyze Feltes today has, No headache, No chest pain, No abdominal pain - No Nausea, No new weakness tingling or numbness, No Cough - SOB. "I am ready to go home"  Objective:     Filed Vitals:    07/02/14 0618 07/03/14 0602 07/04/14 0541 07/05/14 0550   BP: 110/67 110/66 103/67 110/72   Pulse: 83 70 70 75   Temp: 98.2 F (36.8 C) 97.3 F (36.3 C) 97 F (36.1 C) 97 F (36.1 C)   TempSrc: Oral Oral Oral Oral   Resp:       Height:       Weight:       SpO2: 97% 95% 98% 96%     Wt Readings from Last 3 Encounters:   06/30/14 66.679 kg (147 lb)     No intake or output data in the 24 hours ending 07/05/14 1440  Exam  Awake Alert, Oriented x 3   HEENT nl  Lungs CTA  s1s2 no murmur  +ve B.Sounds, Abd Soft, Non tender , No rebound -guarding or rigidity.  No pedal edema  Left FA cut healing ; no secondary infection   Neuro grossly intact     Data Review     Results     Procedure Component Value Units Date/Time    Magnesium [[id number]] Collected:  07/02/14 0550    Specimen Information:  Blood Updated:  07/02/14 0631     Magnesium 2.5 mg/dL     Folate [[id number]] Collected:  07/02/14 0550    Specimen  Information:  Blood Updated:  07/02/14 0550    Vitamin B12 [[id number]] Collected:  07/02/14 0550    Specimen Information:  Blood Updated:  07/02/14 0550    RPR [[id number]] Collected:  07/02/14 0550    Specimen Information:  Blood Updated:  07/02/14 0550    CBC and differential [[id number]]  (Abnormal) Collected:  07/01/14 1118    Specimen Information:  Blood / Blood Updated:  07/01/14 1211     WBC 5.38 x10 3/uL      Hgb 12.8 g/dL      Hematocrit 24.4 %      Platelets 290 x10 3/uL      RBC 3.70 (L) x10 6/uL      MCV 103.0 (H) fL      MCH 34.6 (H) pg      MCHC 33.6 g/dL      RDW 12 %      MPV 10.3 fL      Neutrophils 63 %      Lymphocytes Automated 16 %  Monocytes 17 %      Eosinophils Automated 3 %      Basophils Automated 1 %      Immature Granulocyte Unmeasured %      Nucleated RBC Unmeasured /100 WBC      Neutrophils Absolute 3.40 x10 3/uL      Abs Lymph Automated 0.86 x10 3/uL      Abs Mono Automated 0.90 x10 3/uL      Abs Eos Automated 0.17 x10 3/uL      Absolute Baso Automated 0.05 x10 3/uL      Absolute Immature Granulocyte Unmeasured x10 3/uL     Comprehensive metabolic panel [[id number]]  (Abnormal) Collected:  07/01/14 1118    Specimen Information:  Blood Updated:  07/01/14 1152     Glucose 99 mg/dL      BUN 40.9 mg/dL      Creatinine 0.8 mg/dL      Sodium 811 mEq/L      Potassium 4.2 mEq/L      Chloride 106 mEq/L      CO2 24 mEq/L      CALCIUM 10.3 mg/dL      Protein, Total 7.3 g/dL      Albumin 4.1 g/dL      AST (SGOT) 36 (H) U/L      ALT 42 U/L      Alkaline Phosphatase 52 U/L      Bilirubin, Total 0.3 mg/dL      Globulin 3.2 g/dL      Albumin/Globulin Ratio 1.3      Anion Gap 10.0     GFR [[id number]] Collected:  07/01/14 1118     EGFR >60.0 Updated:  07/01/14 1152         ------------------------------------------------------------------------------------------------------------------        Cardiac Enzymes       ------------------------------------------------------------------------------------------------------------------      Micro Results  @[email or web address] @  Radiology Reports  Ct Head Wo Contrast    07/01/2014   History: Altered mental status.  FINDINGS: Brain CT without intravenous contrast. Correlation with a brain MRI dated February 11, 2004.  There is again diffuse parenchymal volume loss. There is ventriculomegaly which is slightly out of proportion to volume loss. The ventriculomegaly involving also have slightly progressed since the prior examination. There is hypodensity in the supratentorial white matter consistent with moderate chronic small vessel ischemic disease, also slightly more prominent than previously. There is again hypodensity in the subinsular regions, particularly on the left, chronic small vessel ischemic in nature as well. There is no mass effect, acute intracranial hemorrhage. The ventricular system and cisterns are normally configured. The visualized paranasal sinuses and calvarium are unremarkable.     07/01/2014    1. 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 2. Volume loss, ventriculomegaly which is slightly out of proportion to volume loss. This has progressed since the prior examination.  Terrilee Croak, MD  07/01/2014 4:36 PM     Scheduled Meds:  Current Facility-Administered Medications   Medication Dose Route Frequency   . escitalopram  20 mg Oral Daily   . folic acid  1 mg Oral Daily   . levETIRAcetam  500 mg Oral Q12H SCH   . multivitamin  1 tablet Oral Daily   . mupirocin   Topical Q8H SCH   . thiamine  100 mg Oral Daily     Continuous Infusions:   PRN Meds:.acetaminophen, benztropine mesylate, haloperidol, haloperidol lactate, LORazepam, TdaP Booster, traZODone, ziprasidone  Carmelia Bake MD on 07/05/2014 at 2:40 PM

## 2014-07-05 NOTE — Plan of Care (Signed)
Problem: Psychosocial and Spiritual Needs  Goal: Demonstrates ability to cope with hospitalization/illness  Intervention: Provide quiet environment  Pt isolative to her, but comes out once when she wanted something, pt is pleasantly confused. Pt took her Hs meds with no difficulty. Fall precaution maintained. No distress noted, will cont to monitor.

## 2014-07-05 NOTE — Progress Notes (Signed)
Pt attended [employer] with active participation. Pt was alert, ox3 and attentive. She presented with bright affect and stable mood. Her speech was clear and her thoughts were organized. Marland Kitchen

## 2014-07-06 MED ORDER — ESCITALOPRAM OXALATE 10 MG PO TABS
10.0000 mg | ORAL_TABLET | Freq: Every day | ORAL | Status: DC
Start: 2014-07-07 — End: 2014-07-09
  Administered 2014-07-07 – 2014-07-09 (×3): 10 mg via ORAL
  Filled 2014-07-06 (×3): qty 1

## 2014-07-06 NOTE — Plan of Care (Signed)
Problem: Safety  Goal: Patient will be free from injury during hospitalization  Outcome: Progressing  The patient presents with a flat affect.She attended and participated in all groups and activities this shift.Took all of her meds without difficulty.Denies SI/HI/A/VH.The patient continues to be forgetful She will frequently ask the same question numerous times.The patient has been pleasant and cooperative in her interactions with peers and staff.

## 2014-07-06 NOTE — Progress Notes (Signed)
Pt attended all groups offered throughout the day with active participation. She was alert, ox3 and attentive. Pt presented with a bright affect but anxious mood. Her speech was clear and her thoughts were perseverative on her discharge plan. She stated she felt it is unfair for the hospital to keep her and to make the decision about when she can leave. Pt has repeatedly stated that she is ready to be discharged home. Pt seems to enjoy sitting in the lobby socializing with two other peers.

## 2014-07-06 NOTE — Plan of Care (Addendum)
Problem: Psychosocial and Spiritual Needs  Goal: Demonstrates ability to cope with hospitalization/illness  Intervention: Assist patient to identify own strengths and abilities  Pt calm and cooperative with care, states she will like to go home. pt is pleasantly confused. Pt took her Hs meds with no difficulty. Fall precaution maintained. No distress noted, will cont to monitor. Slept 8 hrs.

## 2014-07-06 NOTE — Progress Notes (Signed)
July 06, 2014     SUBJECTIVE AND OBJECTIVE:  According to the night shift, the patient was calm and cooperative with  care.  She was described as pleasantly confused.  She took her bedtime  medications without difficulty.  She demonstrated no distress.  She slept 8  hours.  She reported wanting to go home.  When I met with the patient  today, she was friendly, calm and cooperative.  She spoke of wanting to go  home.  I explained to her that the only way we could send her home would be  if she were to have 24-hour supervision.  She said she did not want that.   She did say after further questioning she would be willing to consider  getting a guardian, but even then she would not want to have someone with  her 24 hours a day.  I talked with her about the meeting that had been held  with the special justice on Friday.  She cannot remember the meeting.  She  explained that she was able to care for herself.  I explained that she has  memory problems associated with her drinking for many, many years and  because of her memory problems she is not safe at home alone.     PSYCHIATRIC SPECIALTY EXAMINATION:  VITAL SIGNS:  See RN assessment reviewed by me.  MUSCULOSKELETAL:  She demonstrates full range of motion with no  choreoathetoid movements, akathisia or tics.     MENTAL STATUS EXAMINATION:  She presented as a well-developed, well-nourished woman who appeared to be  her stated age.  She is fairly well groomed and demonstrated fair hygiene.   She was alert, cooperative, made intermittent eye contact, maintained  interest in the interview.  She demonstrated no psychomotor agitation, mild  retardation or movement problems.  Her speech was goal directed, vague.   Her affect was pleasant, of restricted range and mood congruent.  She  denied suicidal, homicidal, psychotic ideation.  She was alert.   Orientation was not specifically checked.  Insight, motivation, judgment,  use of language, fund of knowledge poor.      ASSESSMENT:  Unspecified neurocognitive disorder, history of unspecified alcohol-related  disorder.     PLAN:  I am tapering the Lexapro started by Dr. Markham Jordan at the time of admission to  determine need.  I am also contacting her case Product manager to  arrange a meeting with [facility] to work on discharge planning.   It is hoped that this meeting will be scheduled for Tuesday or Wednesday.      Estimated floor time, 25 minutes of which at least half was spent in the  coordination of care and counseling, which included talking with the  patient, reviewing records, and talking with staff.

## 2014-07-07 MED ORDER — ARIPIPRAZOLE 2 MG PO TABS
1.0000 mg | ORAL_TABLET | Freq: Two times a day (BID) | ORAL | Status: DC
Start: 2014-07-07 — End: 2014-07-09
  Administered 2014-07-07 – 2014-07-09 (×5): 1 mg via ORAL
  Filled 2014-07-07 (×5): qty 1

## 2014-07-07 NOTE — Progress Notes (Addendum)
On 2/29  T/C to [employer] [facility].  Spoke to [place] and requested that Tyler Aas come to a meeting to help [place] with discharge planning for this pt.  Rosalita Chessman said that they could not help with that but she would tell Doris.  She said that Tyler Aas would be willing to discuss these matters over the phone.  Tyler Aas has not called back.    3/1  T/C VM from [facility] saying Tyler Aas will not be involved with discharge planning for this pt.  Message also said the son is the appropriate person to be involved in D/C planning.

## 2014-07-07 NOTE — Plan of Care (Addendum)
Problem: Loss of functioning (Thought Disorder, Mood Disturbance and/or Severe Anxiety) AS EVIDENCED BY...  Goal: Attends a minimum number of therapies daily  Intervention: Encourage attendance and reinforce small successes in participation  The patient is attending all groups.  She has been irritable this shift because she wants to go home and was told that she has to find someone to live with her.  She has no insight into her impaired judgement and memory loss.  She was told by Dennie Bible who is doing her discharge planning that she definitely would not be leaving this week.  The patient accepted this information and said that she didn't have any more questions.  Will continue to monitor.  The patient approached the nursing station and insisted that Dennie Bible come and talk with her again.  Ativan 0.5mg  PO PRN was offered and accepted at approximately 1540.

## 2014-07-07 NOTE — Progress Notes (Signed)
July 07, 2014     SUBJECTIVE AND OBJECTIVE:  According to night shift the patient remained calm and cooperative and as  they put it, "pleasantly confused".  She took medications without  difficulty.  About 8 p.m. she told staff that her sister died the day  before, demonstrated no distress.  She slept for 7 hours.  When I met with  the patient today, she was friendly and cooperative.  She did explain that  she needs to go home.  I explained to her that she has memory problem  related to her heavy alcohol abuse over the past years.  She said, "I can  believe that".  She explained that currently she does not drink as much as  she used, just 1 or 2 drinks with her food, depending on what she is  eating.  I explained to her again today that we could not send her home  unless she was going to be supervised by someone on a 24-hour basis because  of her memory problem.  She explained politely said needs her privacy and  Lavell Islam, the charge nurse who also participated in the session, agreed  with her, but we both reiterated that because of her memory problems she  has to have someone with her.  She did express willingness to think about  finding someone, but said she had no phone numbers with her.  We explained  that she could call her son and get phone numbers for people she was  thinking of using.     PSYCHIATRIC SPECIALTY EXAMINATION:  VITAL SIGNS:  See RN assessment reviewed by me.   MUSCULOSKELETAL:  She demonstrated full range of motion with no  choreoathetoid movements, akathisia or tics.      MENTAL STATUS EXAMINATION:  She presented as a well-developed, well-nourished woman who appeared to be  her stated age.  She was well groomed and demonstrated good hygiene.  She  was alert, cooperative, made intermittent eye contact and maintained  interest in the interview.  She demonstrated no psychomotor agitation, mild  retardation, no movement problems.  Her speech was goal directed in  phrases.  Her affect was pleasant,  of normal range and mood congruent.  She  denied and demonstrated no evidence of suicidal, homicidal or psychotic  ideation.  She was alert.  Specific orientation was not checked.  We  discuss giving her a trial of Abilify.  She asked a few questions about  that and was able to sign the consent form appropriately, although she  jokingly said she did not know the date, saying that she does not have her  daily paper on which she used to rely.  Insight, motivation, judgment fair  to poor.  Use of language, fund of knowledge fair to poor.     ASSESSMENT:  Unspecified neurocognitive disorder, history of unspecified alcohol-related  disorder with recent delirium tremens.  The patient's mental status appears  improved in that she is more organized and better able to think about  issues presented but still demonstrates marked lack of memory.     PLAN:  We will introduce Abilify in low-dose to see if the addition of this  medication will improve the patient's cognitive functioning at all by  controlling any disorganization that may be present.  We will continue to  taper the Lexapro.  Our case manager will look for a locked unit for  placement, although we are hoping before that is found that we will see  the  patient's memory improving to the point that she could be placed in a less  secure situation.     Estimated floor time 25 minutes, of which at least half was spent in the  coordination of care and counseling, which included talking with the  patient, reviewing records, talking with staff.

## 2014-07-07 NOTE — Plan of Care (Signed)
Problem: Psychosocial and Spiritual Needs  Goal: Demonstrates ability to cope with hospitalization/illness  Intervention: Provide quiet environment  Pt calm and cooperative with care,  pt is pleasantly confused. Pt took her Hs meds with no difficulty. Fall precaution maintained. Around 2030 round, pt told staff the staff that her sister is died yesterday.  No distress noted, will cont to monitor. Slept 7 hrs.

## 2014-07-08 NOTE — Progress Notes (Signed)
Spoke to son ALETHEIA TANGREDI [phone].  He says his mother has money  She gets $1800.00 a month, plus has savings and owns her townhouse.  He was interested in [facility] in [place], [place].  Cost $2500.00 monthly but it is not locked.  I recommended a locked facility.  LVM for [facility] regarding admission for this pt.  Corrie Dandy is at:  [facility]  Address: [address] [place], [place] [address]   Phone: [phone]

## 2014-07-08 NOTE — Progress Notes (Signed)
Pt attended all groups offered throughout the day with active participation. She was alert, ox3 and attentive. Pt presented with bright affect and stable mood. Her speech was clear but she continues to be very forgetful. Pt also continues to state that she wants to be discharged.

## 2014-07-08 NOTE — Plan of Care (Signed)
Problem: Psychosocial and Spiritual Needs  Goal: Demonstrates ability to cope with hospitalization/illness  Outcome: Progressing  Patient unchanged from previous shifts. She continues to be confused and requests to go home. She reports her sister just died this past 10/25/22 and she needs to go home. She denies SI/HI or AVH.

## 2014-07-08 NOTE — Progress Notes (Signed)
Kaylynn Dulany CSN:[id number],MRN:[id number] is a 64 y.o. female,  Outpatient Primary MD for the patient is No primary care provider on file.   No chief complaint on file.     Assessment & Plan   -abrasion to left arm healing; con't to wash daily during showers  -AMS improved from admission however unclear cognitive deficts remain.  Medical workup negative.  Likely multifactorial in setting of chronic alcohol abuse, tia hx and falls.  con't mvi and supplements daily.  Falls precautions.      Subjective:   Lil Sanzo today has, No headache, No chest pain, No abdominal pain - No Nausea, No new weakness tingling or numbness.  "I want to go home"  Objective:     Filed Vitals:    07/04/14 0541 07/05/14 0550 07/06/14 0622 07/07/14 0601   BP: 103/67 110/72 129/85 111/67   Pulse: 70 75 78 71   Temp: 97 F (36.1 C) 97 F (36.1 C) 98.1 F (36.7 C) 97.3 F (36.3 C)   TempSrc: Oral Oral Oral Oral   Resp:       Height:       Weight:       SpO2: 98% 96% 98% 95%     Wt Readings from Last 3 Encounters:   06/30/14 66.679 kg (147 lb)     No intake or output data in the 24 hours ending 07/08/14 0558  Exam  Lungs CTA  s1s2 no murmur  +ve B.Sounds, Abd Soft, Non tender  Left forearm no red streaking, active d/c.      Data Review     Results     Procedure Component Value Units Date/Time    Glucose Whole Blood - POCT [[id number]] Collected:  07/04/14 1205     POCT - Glucose Whole blood 88 mg/dL Updated:  40/98/11 9147         ------------------------------------------------------------------------------------------------------------------        Cardiac Enzymes      ------------------------------------------------------------------------------------------------------------------      Micro Results  @microrslt10 @  Radiology Reports  Ct Head Wo Contrast    07/01/2014   History: Altered mental status.  FINDINGS: Brain CT without intravenous contrast. Correlation with a brain MRI dated February 11, 2004.  There is again diffuse  parenchymal volume loss. There is ventriculomegaly which is slightly out of proportion to volume loss. The ventriculomegaly involving also have slightly progressed since the prior examination. There is hypodensity in the supratentorial white matter consistent with moderate chronic small vessel ischemic disease, also slightly more prominent than previously. There is again hypodensity in the subinsular regions, particularly on the left, chronic small vessel ischemic in nature as well. There is no mass effect, acute intracranial hemorrhage. The ventricular system and cisterns are normally configured. The visualized paranasal sinuses and calvarium are unremarkable.     07/01/2014    1. 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 2. Volume loss, ventriculomegaly which is slightly out of proportion to volume loss. This has progressed since the prior examination.  Terrilee Croak, MD  07/01/2014 4:36 PM     Scheduled Meds:  Current Facility-Administered Medications   Medication Dose Route Frequency   . ARIPiprazole  1 mg Oral BID   . escitalopram  10 mg Oral Daily   . folic acid  1 mg Oral Daily   . levETIRAcetam  500 mg Oral Q12H SCH   . multivitamin  1 tablet Oral Daily   . thiamine  100 mg Oral  Daily     Continuous Infusions:   PRN Meds:.acetaminophen, benztropine mesylate, haloperidol, haloperidol lactate, LORazepam, TdaP Booster, traZODone, ziprasidone      Delle Reining NP on 07/08/2014 at 5:58 AM

## 2014-07-08 NOTE — Plan of Care (Addendum)
Problem: Loss of functioning (Thought Disorder, Mood Disturbance and/or Severe Anxiety) AS EVIDENCED BY...  Goal: Will remain safe during hospitalization  Outcome: Progressing  Pt alternated between room and lobby. Pleasant and cooperative upon approach. Denies SI/HI/AVH. Reported anxiety and depression. Continues to be forgetful and perseverative and repeats the same statement over and over .She presents with bright affect, clear speech and poor insight and judgment. Pt  anxious about going home. Took HS medication without difficulty. Q15 minute checks, brief 1:1 contacts, monitored for safety.Will continue to monitor.     Pt appeared to sleep 8 Hours   PRN:   Ativan 0.5 mg @ 2128 for anxiety

## 2014-07-08 NOTE — Progress Notes (Signed)
July 08, 2014      SUBJECTIVE AND OBJECTIVE:  According to the night shift, the patient alternated between room and  lobby.  She was pleasant and cooperative.  She demonstrated poor insight  and judgment.  She continued to talk about wanting to go home.  Our case manager, Ginger Organ, talked with  the patient's son.  He explained that his mother has money.  She gets $1800  a month plus has savings and owns her own townhouse.  We talked about  options for placement.  We felt the son is cooperative with the  process.  He will be looking into [facility], which is a  locked facility.       When I met with the patient today, she again reminded me that she wants to  leave.  We reviewed the fact that she has been committed to the unit for  the time being and because of problems with her memory and related to acute  issues, she cannot go home unless she has someone to be with her 24/7.  She  states that she does not understand why.  Again, we reviewed the fact that  she has memory trouble.  She was not able to remember the fact that she had  a commitment hearing the day before.  She said she would be happy to have  people come visit her, but she wanted her independence.       PSYCHIATRIC SPECIALTY EXAMINATION:  VITAL SIGNS:  See RN assessment reviewed by me.  MUSCULOSKELETAL:  She demonstrated full range of motion with no  choreoathetoid movements, akathisia, or tics.     MENTAL STATUS EXAMINATION:  She presented as a well-developed, well-nourished woman who appeared to be  her stated age.  She was well groomed and demonstrated good hygiene.  She  was alert, cooperative, made intermittent eye contact and maintained  interest in the interview.  She demonstrated no psychomotor agitation,  although some irritability associated with being told that she could not go  home without someone being with her full-time.  She was redirectable. She  was pleasant.   Insight moderate.  She denied and demonstrated no evidence  of  suicidal, homicidal, or psychotic ideation.  The patient was alert.   Insight, motivation, and judgment, use of language, fund of knowledge fair  to poor.     ASSESSMENT:  1.  Unspecified cognitive disorder.   2.  History of unspecified alcohol-related disorder with recent delirium  tremens.     PLAN:  I have started low-dose Abilify to see if this medication helps with  cognitive function in any way and gradually tapering her off the Lexapro.   Continue to monitor.     Estimated floor time 25 minutes, of which at least half was spent in the  coordination of care and counseling, which included talking with patient,  reviewing records, talking with staff.

## 2014-07-08 NOTE — Plan of Care (Addendum)
Problem: Safety  Goal: Patient will be free from injury during hospitalization  Outcome: Progressing  Pt alternated between room and lobby. Pleasant and cooperative upon approach. Denies SI/HI/AVH. Reported anxiety and depression. Reported normal appetite, energy and sleep. Continues to be confused and perseverative and repeats the same statement about bruises in her stomach and about her sister death. She presents slowed, with poor insight and judgment. Pt wants to go home. Took HS medication without difficulty. Q15 minute checks, brief 1:1 contacts, monitored for safety.A/o x 3 . Will continue to monitor.   Pt appeared to sleep 7 Hours   PRN:   Ativan 0.5 mg @ 2110 for anxiety

## 2014-07-09 MED ORDER — ARIPIPRAZOLE 2 MG PO TABS
1.0000 mg | ORAL_TABLET | Freq: Three times a day (TID) | ORAL | Status: DC
Start: 2014-07-09 — End: 2014-07-10
  Administered 2014-07-09 – 2014-07-10 (×3): 1 mg via ORAL
  Filled 2014-07-09 (×3): qty 1

## 2014-07-09 MED ORDER — ESCITALOPRAM OXALATE 10 MG PO TABS
5.0000 mg | ORAL_TABLET | Freq: Every day | ORAL | Status: DC
Start: 2014-07-10 — End: 2014-07-10
  Administered 2014-07-10: 5 mg via ORAL
  Filled 2014-07-09: qty 1

## 2014-07-09 NOTE — Progress Notes (Signed)
July 09, 2014     SUBJECTIVE AND OBJECTIVE:  According to night shift, the patient was pleasant and cooperative.  She  alternated between her room and the patient lobby.  She continued to be  forgetful, repeating the same statement over and over again that she wants  to go home.  She took medications without difficulty.  I met with the  patient today with her treatment team.  We talked with her about how she  was doing.  She continued to speak of wanting to go home.  We talked about  her children, concerns for them.  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  She appeared very caring  and effective in the description of her roles.  She continued to  demonstrate marked problems with memory.  She was unable to remember her  meeting with the chaplain 2 days before.  She thought the chaplain was a  female, it was a man.  She could not remember things that happened the day  before.  She was unable to remember 2 of 3 objects after 3 to 4 minutes.   She talked about her relationship with her children and spoke of having a  positive relationship with both of her sons.  She expressed willingness to  have 1 function as power of attorney, since her sister is now dead.  She  said either would function well, but she thought she may chose [place], the  older one who lives in [place], may do better since he is older, but  either would be fine.  Purnell Shoemaker our unit therapist and social worker  will meet with her later to discuss this with her.  The patient signing a  power of attorney is part of the process of admission to the care facility  our case managers are working on.     PSYCHIATRIC SPECIALTY EXAMINATION:  VITAL SIGNS:  See RN assessment reviewed by me.  MUSCULOSKELETAL:  She demonstrates full range of motion with no  choreoathetoid movement, akathisia or tics.       MENTAL STATUS EXAMINATION:  She presented as a well-developed, well-nourished woman who appeared to  be  her stated age.  She was well groomed and demonstrated fair hygiene.  She  was alert, cooperative, made intermittent eye contact and maintained  interest in the interview.  She demonstrated no psychomotor agitation,  retardation, or movement problems.  Her speech was goal directed,  nonpressured.  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     ASSESSMENT:  Unspecified cognitive disorder, history of unspecified alcohol-related  disorder with recent delirium.     PLAN:  We will increase the Abilify to 1 mg three times daily since this  medication may be associated with some improvement noted.  The patient is  tolerating it well.  We will continue to work towards getting power of  attorney and getting an appropriate long-term placement for the patient.       Estimated floor time, 25 minutes, of which at least half was spent in the  coordination of care and counseling, which included talking with the  patient, reviewing records, talking with staff.

## 2014-07-09 NOTE — Progress Notes (Signed)
Pt alternated between room and lobby although spent most of her time in the lobby.  Social with select peers and makes several phone calls to family and friends requesting them to pick her up and bring her home. Pleasant and cooperative upon approach. Denies SI/HI/AVH. Repors anxiety and depression. Continues to be forgetful and perseverative and repeats the same statement over and over .She presents with bright affect, clear speech and poor insight and judgment. Q15 minute safety checks in place and will monitor for needs and safety.     NO PRNS

## 2014-07-09 NOTE — Plan of Care (Signed)
Problem: Safety  Goal: Patient will be free from injury during hospitalization  Outcome: Progressing  Fall precautions in place at all times     Rounding in place at all times

## 2014-07-10 MED ORDER — ARIPIPRAZOLE 2 MG PO TABS
2.0000 mg | ORAL_TABLET | Freq: Two times a day (BID) | ORAL | Status: DC
Start: 2014-07-10 — End: 2014-07-15
  Administered 2014-07-10 – 2014-07-15 (×9): 2 mg via ORAL
  Filled 2014-07-10 (×10): qty 1

## 2014-07-10 NOTE — Plan of Care (Addendum)
Problem: Safety  Goal: Patient will be free from injury during hospitalization  Intervention: Provide and maintain safe environment  The patient was out in the unit milieu at the start of the shift socializing with patients and watching television.  On assessment, she appeared confused at times with impaired recent memory.  She showed me bruises on her stomach and said she was concerned because they "just appeared."  The patient did not remember that these were from Lovenox injections when she was on the medical unit.  The patient was preoccupied with thoughts of wanting to go home.  She said that she lives only 10 minutes from her son.  She was oriented times 3 - not oriented to time.  She stated that this was because she was used to getting the paper every morning at home to see the date.  She received her scheduled medications along with PRN Ativan for anxiety.  She denied SI, HI or AVH.  Pt also denied pain.  She rested in her room until she fell asleep.  She will continue to be followed throughout the evening for safety and progress.  Pt appeared to sleep 7 hours.    PRN  Ativan 0.5 mg @ 2155

## 2014-07-10 NOTE — Progress Notes (Signed)
July 10, 2014     SUBJECTIVE AND OBJECTIVE:  According to night shift the patient was in the milieu.  At the start of  shift socializing with the patient and watching TV.  On assessment she  continued to appear confused and times having problems with recent memory.   She continued to talk about wanting to go home.  She appeared to sleep for  7 hours.  When I met with the patient during the day today with one of the  unit therapists the patient continued to ask about discharge planning.   Reminded her that we talked yesterday about her being able to go home if  she can find someone to live with her and provide supervision 24/7.  I told  her that currently we are looking for placement for her where she will get  that supervision.  She spoke of feeling angry because she wants to go home.   I expressed understanding of and support for her, but reminded her that it  was our responsibility to make sure she is safe and because of her severe  memory problems we had to provide supervision.  Her mood changed fairly  quickly back from irritable to friendly.     PSYCHIATRIC SPECIALTY EXAMINATION:  VITAL SIGNS:  See RN assessment, reviewed by me.  MUSCULOSKELETAL:  She demonstrates full range of motion, but no  choreoathetoid movements, akathisia or tics.     MENTAL STATUS EXAMINATION:  She presented as a well-developed, well-nourished woman who appeared to be  her stated age.  She was well groomed and demonstrated fair hygiene.  She  was alert, cooperative, made intermittent eye contact and maintained  interest in the interview.  She demonstrated no psychomotor agitation,  retardation, or movement problems.  His speech was goal directed, but  somewhat vague and confused.  Her affect was pleasant to irritable  (appropriate for content) Insight, motivation, judgment, use of language,  fund of knowledge poor.     ASSESSMENT:  Unspecified; cognitive disorder, history of unspecified alcohol-related  disorder with recent delirium.      PLAN:  We will continue to work towards finding a safe placement.  Our case  manager has had some success and is the process of talking with family.  We  will increase the patient's Abilify to 2 mg two times daily and this  medicine may be associated with some improvement in clarity.  Estimated  floor time 25 minutes of which at least half was spent in coordination of  care and counseling, which included talking with patient, reviewing  records, and talking with staff.

## 2014-07-10 NOTE — Progress Note - Problem Oriented Charting Notewrit (Signed)
Pt continues to be calm and cooperative with staff although she is perseverative about leaving. Pt continues to be confused and suffer from [facility] frequently asking staff repeatedly for needs/medications. Denies SI/HI/AVH "states I just want to go home"     Mood is depressed and affect is flat. No agitation or behavior problems this shift.     Ongoing monitoring for needs and safety     PRN tylenol given for headache

## 2014-07-10 NOTE — Plan of Care (Signed)
Problem: Safety  Goal: Patient will be free from injury during hospitalization  Outcome: Progressing  Fall prevention in place     Rounding in place at all times

## 2014-07-11 NOTE — Progress Notes (Signed)
July 11, 2014     SUBJECTIVE AND OBJECTIVE:  According to night shift the patient was awakened in bed at the start of  the shift.  She did not come out to socialize with peers.  She appeared  depressed with flat affect.  She continued to be disoriented to day and  time.  She displayed intermittent confusion throughout the shift.  For  example, repeatedly asking for her medication even though it had been given  earlier.  She appeared to sleep for 6 hours.  On the day shift when I met  with the patient she was irritated with me because I am not helping her get  home.  She said she remembered talking to me, but all she remembered was  that what I had talked about was "bullshit".  I reviewed with her the  problem with her memory and how we need to find a place for her to go where  she can be supervised.  She reported wanting to go home.  I reminded her  that I had told her that she would need to have someone with her 24 hours a  day if she were to go home.  She angrily said that she could not do that  from here, would need to go home to be able to make those arrangements.  I  again reviewed with her the fact that she had a commitment hearing about 8  days ago, at which time because of her memory problems the court decided  that she had to stay in the hospital for the time being.  She was angry  with me for having not listened to her request.     PSYCHIATRIC SPECIALTY EXAMINATION:    VITAL SIGNS:  See RN assessment reviewed by me.  MUSCULOSKELETAL:  She demonstrated full range of motion with no  choreoathetoid movements, akathisia or tics.      MENTAL STATUS EXAMINATION:  She presented as a well-developed, well-nourished woman who appeared her  stated age.  She was well groomed and demonstrated good hygiene.  She was  alert, cooperative, made intermittent eye contact and maintained interest  in the interview.  She demonstrated slight psychomotor agitation without  retardation or movement problems.  Her speech was goal  directed, mildly  pressured when she spoke angrily with me.  She was oriented to person, not  to place or situation.  Insight, motivation, judgment, use of language,  fund of knowledge poor.     ASSESSMENT:  Unspecified cognitive disorder, history of unspecified alcohol-related  disorder with recent delirium.     PLAN:  Will continue working on finding placement.  Our case managers are in the  process of finding appropriate options for the patient's family.     Estimated floor time 25 minutes, of which at least half was spent in the  coordination of care and counseling, which included talking with the  patient, reviewing records and talking with staff.

## 2014-07-11 NOTE — Plan of Care (Signed)
Problem: Loss of functioning (Thought Disorder, Mood Disturbance and/or Severe Anxiety) AS EVIDENCED BY...  Goal: Patient's recovery goal in his/her own words:  Outcome: Progressing  The patient is perseverating on being discharged.  She states that she "just cannot spend another night here".  She has become hostile and insistent when her discharge is discussed.  She has no insight into her memory problems or inability to care for self independently.  Otherwise, the patient is cooperative.  Will continue to monitor.

## 2014-07-11 NOTE — Plan of Care (Addendum)
Problem: Safety  Goal: Patient will be free from injury during hospitalization  Outcome: Progressing  Pt awake in bed at the start of the shift and did not come out to socialize with peers. She appeared depressed and affect was flat.She did cooperate with assessment. Was disoriented to day and time. She denied SI/HI. Initially she declined to have a sleeping pill but latter requested for it and verbalized being anxious as well.She displayed intermittent confusion throughout shift like repeatedly asking for her medications even though they've been given already. She stayed in room throughout until sleeping med took effect. Been sleeping for 6 hours now. Monitored by staff. Support provided.    Prn med's given :  Ativan 0.5mg  for anxiety  Trazodone 50 mg for sleep

## 2014-07-12 MED ORDER — RISAQUAD PO CAPS
1.0000 | ORAL_CAPSULE | Freq: Every day | ORAL | Status: DC
Start: 2014-07-12 — End: 2014-07-21
  Administered 2014-07-13 – 2014-07-21 (×9): 1 via ORAL
  Filled 2014-07-12 (×9): qty 1

## 2014-07-12 NOTE — Progress Notes (Signed)
Pt perseverates on going home and the death of her sister. Pt states she needs to go home. Pt states "this place is making me crazy". Pt socialized with her peers ate meals and took medication without difficulty. PT needs frequent redirection.

## 2014-07-12 NOTE — Plan of Care (Signed)
Problem: At Risk for Suicide / Harm to Self AS EVIDENCED BY...  Goal: Suicide Alert Level 2: to maintain patient safety during hospitalization  Intervention: Every 15 minute checks  Pt denies SI HI at this time  Pt educated to ask staff for help.  Continued 15 minute rounding.

## 2014-07-12 NOTE — Progress Notes (Addendum)
Virginia Reid CSN:[id number],MRN:[id number] is a 64 y.o. female,  Outpatient Primary MD for the patient is No primary care provider on file.   No chief complaint on file.     Assessment & Plan   -Abdominal distension with spontaneous bruising marks. Add Probiotics. Not on aspirin. Check CBC  -AMS improved. 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  Labs overall negative.  Negative RPR & vitamin B12, folate are normal.   Hx chronic alcohol drinking, passing out, falling, hitting head, hx tia in past.       -chronic alcoholism with hx DT. seen neuro at [facility]; mvi, folate. con't keppra. 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        Subjective:   Virginia Reid today has abdominal distension. Denies any nausea, vomiting, constipation. Has increased bloating, No headache, No chest pain, No abdominal pain - No Nausea, No new weakness tingling or numbness, No Cough - SOB. "I am ready to go home". Reports that she is upset by the death of her twin sister  Objective:     Filed Vitals:    07/09/14 0559 07/10/14 0535 07/11/14 0647 07/12/14 0621   BP: 101/67 105/67 103/71 94/65   Pulse: 74 74 98 102   Temp: 98.1 F (36.7 C) 98.2 F (36.8 C) 97.9 F (36.6 C)    TempSrc: Oral Oral Oral Temporal Artery   Resp:       Height:       Weight:       SpO2: 95% 96% 96% 100%     Wt Readings from Last 3 Encounters:   06/30/14 66.679 kg (147 lb)     No intake or output data in the 24 hours ending 07/12/14 1239  Exam  Awake Alert, Oriented x 3   HEENT nl  Lungs CTA  s1s2 no murmur  +ve B.Sounds, Abd Soft, Non tender , No rebound -guarding or rigidity.  No pedal edema  Left FA cut healing ; no secondary infection   Neuro grossly intact     Data Review     Results     Procedure Component Value Units Date/Time    Magnesium [[id number]]  Collected:  07/02/14 0550    Specimen Information:  Blood Updated:  07/02/14 0631     Magnesium 2.5 mg/dL     Folate [[id number]] Collected:  07/02/14 0550    Specimen Information:  Blood Updated:  07/02/14 0550    Vitamin B12 [[id number]] Collected:  07/02/14 0550    Specimen Information:  Blood Updated:  07/02/14 0550    RPR [[id number]] Collected:  07/02/14 0550    Specimen Information:  Blood Updated:  07/02/14 0550    CBC and differential [[id number]]  (Abnormal) Collected:  07/01/14 1118    Specimen Information:  Blood / Blood Updated:  07/01/14 1211     WBC 5.38 x10 3/uL      Hgb 12.8 g/dL      Hematocrit 60.6 %      Platelets 290 x10 3/uL      RBC 3.70 (L) x10 6/uL      MCV 103.0 (H) fL      MCH 34.6 (H) pg      MCHC 33.6 g/dL      RDW 12 %      MPV 10.3 fL  Neutrophils 63 %      Lymphocytes Automated 16 %      Monocytes 17 %      Eosinophils Automated 3 %      Basophils Automated 1 %      Immature Granulocyte Unmeasured %      Nucleated RBC Unmeasured /100 WBC      Neutrophils Absolute 3.40 x10 3/uL      Abs Lymph Automated 0.86 x10 3/uL      Abs Mono Automated 0.90 x10 3/uL      Abs Eos Automated 0.17 x10 3/uL      Absolute Baso Automated 0.05 x10 3/uL      Absolute Immature Granulocyte Unmeasured x10 3/uL     Comprehensive metabolic panel [[id number]]  (Abnormal) Collected:  07/01/14 1118    Specimen Information:  Blood Updated:  07/01/14 1152     Glucose 99 mg/dL      BUN 47.8 mg/dL      Creatinine 0.8 mg/dL      Sodium 295 mEq/L      Potassium 4.2 mEq/L      Chloride 106 mEq/L      CO2 24 mEq/L      CALCIUM 10.3 mg/dL      Protein, Total 7.3 g/dL      Albumin 4.1 g/dL      AST (SGOT) 36 (H) U/L      ALT 42 U/L      Alkaline Phosphatase 52 U/L      Bilirubin, Total 0.3 mg/dL      Globulin 3.2 g/dL      Albumin/Globulin Ratio 1.3      Anion Gap 10.0     GFR [[id number]] Collected:  07/01/14 1118     EGFR >60.0 Updated:  07/01/14 1152          ------------------------------------------------------------------------------------------------------------------        Cardiac Enzymes      ------------------------------------------------------------------------------------------------------------------      Micro Results  @microrslt10 @  Radiology Reports  Ct Head Wo Contrast    07/01/2014   History: Altered mental status.  FINDINGS: Brain CT without intravenous contrast. Correlation with a brain MRI dated February 11, 2004.  There is again diffuse parenchymal volume loss. There is ventriculomegaly which is slightly out of proportion to volume loss. The ventriculomegaly involving also have slightly progressed since the prior examination. There is hypodensity in the supratentorial white matter consistent with moderate chronic small vessel ischemic disease, also slightly more prominent than previously. There is again hypodensity in the subinsular regions, particularly on the left, chronic small vessel ischemic in nature as well. There is no mass effect, acute intracranial hemorrhage. The ventricular system and cisterns are normally configured. The visualized paranasal sinuses and calvarium are unremarkable.     07/01/2014    1. 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 2. Volume loss, ventriculomegaly which is slightly out of proportion to volume loss. This has progressed since the prior examination.  Terrilee Croak, MD  07/01/2014 4:36 PM     Scheduled Meds:  Current Facility-Administered Medications   Medication Dose Route Frequency   . ARIPiprazole  2 mg Oral BID Meals   . folic acid  1 mg Oral Daily   . levETIRAcetam  500 mg Oral Q12H SCH   . multivitamin  1 tablet Oral Daily   . thiamine  100 mg Oral Daily     Continuous Infusions:   PRN Meds:.acetaminophen, benztropine mesylate, haloperidol,  haloperidol lactate, LORazepam, TdaP Booster, traZODone, ziprasidone      Carmelia Bake MD on 07/12/2014 at 12:39  PM

## 2014-07-12 NOTE — Plan of Care (Addendum)
Problem: Loss of functioning (Thought Disorder, Mood Disturbance and/or Severe Anxiety) AS EVIDENCED BY...  Goal: Demonstrates orientation to person place and time  Intervention: Monitor patients orientation status every shift  Patient in the lobby on first rounds.  Able to make her needs known, with request to see the medical Doctor for a swollen area in the upper abdominal region.  States it's been there, but now is starting to hurt.  A request for Dr. Tressie Ellis to see patient placed on consultation sheet.  Patient continues to complain about leaving, wanting to be discharged to her home.  Her thoughts are still confused about the time that of her twin sister's death.  Repeats that her sister died 10-03-2022, although it's been almost a month. States her family is not handling this well, and she doesn't know why she died.  She needs to go home, and find out the details.  Patient has been angry about this all day, but this evening has not demonstrated any outburst towards Clinical research associate, or staff.  Retired early to her room, at 2100, accepted evening meds without difficulty.  She denies SI/HI, AVH.   Slept sound 8 hours, with Trazodone 50 mg, Ativan 1 mg 2145.  Will continue to monitor her progress, and safety while waiting for placement.

## 2014-07-13 ENCOUNTER — Inpatient Hospital Stay: Payer: 59

## 2014-07-13 LAB — CBC AND DIFFERENTIAL
Basophils Absolute Automated: 0.08 10*3/uL (ref 0.00–0.20)
Basophils Automated: 2 %
Eosinophils Absolute Automated: 0.25 10*3/uL (ref 0.00–0.70)
Eosinophils Automated: 5 %
Hematocrit: 41.5 % (ref 37.0–47.0)
Hgb: 14.1 g/dL (ref 12.0–16.0)
Lymphocytes Absolute Automated: 1.16 10*3/uL (ref 0.50–4.40)
Lymphocytes Automated: 24 %
MCH: 33.8 pg — ABNORMAL HIGH (ref 28.0–32.0)
MCHC: 34 g/dL (ref 32.0–36.0)
MCV: 99.5 fL (ref 80.0–100.0)
MPV: 10.3 fL (ref 9.4–12.3)
Monocytes Absolute Automated: 0.6 10*3/uL (ref 0.00–1.20)
Monocytes: 12 %
Neutrophils Absolute: 2.74 10*3/uL (ref 1.80–8.10)
Neutrophils: 57 %
Platelets: 308 10*3/uL (ref 140–400)
RBC: 4.17 10*6/uL — ABNORMAL LOW (ref 4.20–5.40)
RDW: 11 % — ABNORMAL LOW (ref 12–15)
WBC: 4.83 10*3/uL (ref 3.50–10.80)

## 2014-07-13 LAB — PT AND APTT
PT INR: 1.1
PT: 13.6 s (ref 12.6–15.0)
PTT: 32 s (ref 23–37)

## 2014-07-13 NOTE — Progress Notes (Signed)
SUBJECTIVE AND OBJECTIVE:  According to the night shift, the patient was pleasant on approach.  She  was visible in the milieu.  She interacted with peers.  She told the writer  that she is tired of being in the hospital, wanting to go back to her  family and reviewed how her twin sister had died and she had missed the  funeral.  She demonstrated periods of confusion, disorientation to the  shift.  She was able to maintain safety and slept 5 hours broken.  When I  met with the patient, she discussed had just gotten off the phone.  She  explained that she was talking with her son, Mardelle Matte, and Mardelle Matte had found a  place for her to go to in [place].  She seemed positive about it.  She  said she preferred to go home, but that this would be a reasonable  alternative initially that they were working on together.  She explained  that a representative from that place would be coming to the unit tomorrow.   She continued to say she would prefer to be home with someone coming in to  help her on a daily basis.  This attitude about the placement in [place]  and allowing someone to coming to her home seems a little more positive.   She then spoke about positive things that have happened on the unit,  including telling me that the first group is run by the patients without a  staff member present and how they did a good job organizing their goals.   She seemed very happy.  She acknowledged that the patients are pleasant and  so are the staff.     PSYCHIATRIC SPECIALTY EXAMINATION:  VITAL SIGNS:  She RN assessment reviewed by me.  MUSCULOSKELETAL:  She demonstrates full range of motion with no  choreoathetoid movements, akathisia or tics.       MENTAL STATUS EXAMINATION:  She presented as a well-developed, well-nourished woman who appeared to be  her stated age.  She was well groomed and demonstrated good hygiene.  She  was alert, cooperative, made intermittent eye contact, and maintained  interest in the interview.  She  demonstrated no psychomotor agitation,  retardation, or movement problems.  Her speech was goal-directed, although  she was able to speak in paragraphs and make sense.  Her memory continued  to be markedly impaired.  Insight, motivation, and judgment, use of  language, fund of fair to poor.     ASSESSMENT:  Unspecified cognitive disorder, history of the unspecified alcohol-related  disorder with recent delirium, showing some improvement, but still not  enough to support her caring for herself without constant supervision.     PLAN:   We will continue medications as ordered.  We will continue to provide  support and encouragement.      Floor time 25 minutes of which at least half was spent in the coordination  of care and counseling,  which included talking with patient, reviewing  records, and talking with staff.

## 2014-07-13 NOTE — Plan of Care (Addendum)
Problem: Psychosocial and Spiritual Needs  Goal: Demonstrates ability to cope with hospitalization/illness  Outcome: Progressing  Nice and pleasant upon approach. Was visible in milieu and she did interact with peers.Anxiously told Clinical research associate that she is tired of being in the hospital and wants to go back to her family.She told Clinical research associate that her twin sister died last week in a hospital and so she is nervous about staying here.Pt encouraged to express concerns to treatment team. Periods of confusion and disorientation noted through the shift. Pt was able to maintain safety. She slept for 5 hours broken.      Trazodone 50 mg @2145  for sleep

## 2014-07-13 NOTE — Progress Notes (Signed)
Virginia Reid CSN:[id number],MRN:[id number] is a 64 y.o. female,  Outpatient Primary MD for the patient is No primary care provider on file.   No chief complaint on file.     Assessment & Plan   -seizure history suspected r/t to alcohol however can not confirm; con't keppra  -abd bruising with nl cbc; no hematoma.  Monitor for now  -abd distention on lactobacillus; +bm check KUB ordered; doubt SBO r/o constipation    Subjective:   Virginia Reid today has, No headache, No chest pain, No Nausea, No new weakness tingling or numbness, No Cough - SOB.  Objective:     Filed Vitals:    07/10/14 0535 07/11/14 0647 07/12/14 0621 07/13/14 0618   BP: 105/67 103/71 94/65 118/69   Pulse: 74 98 102 78   Temp: 98.2 F (36.8 C) 97.9 F (36.6 C)  98.1 F (36.7 C)   TempSrc: Oral Oral Temporal Artery Oral   Resp:       Height:       Weight:       SpO2: 96% 96% 100% 98%     Wt Readings from Last 3 Encounters:   06/30/14 66.679 kg (147 lb)     No intake or output data in the 24 hours ending 07/13/14 0835  Exam  Awake Alert, Oriented x 3   HEENT nl  Lungs CTA  s1s2 no murmur  +ve B.Sounds mild distention; no rebound/guarding; ecchymotic areas resolving  No Cyanosis, Clubbing or edema, No new Rash or bruise   Neuro grossly intact     Data Review     Results     Procedure Component Value Units Date/Time    CBC and differential [[id number]]  (Abnormal) Collected:  07/13/14 0617    Specimen Information:  Blood / Blood Updated:  07/13/14 0800     WBC 4.83 x10 3/uL      Hgb 14.1 g/dL      Hematocrit 13.0 %      Platelets 308 x10 3/uL      RBC 4.17 (L) x10 6/uL      MCV 99.5 fL      MCH 33.8 (H) pg      MCHC 34.0 g/dL      RDW 11 (L) %      MPV 10.3 fL      Neutrophils 57 %      Lymphocytes Automated 24 %      Monocytes 12 %      Eosinophils Automated 5 %      Basophils Automated 2 %      Immature Granulocyte Unmeasured %      Nucleated RBC Unmeasured /100 WBC      Neutrophils Absolute 2.74 x10 3/uL      Abs Lymph Automated 1.16 x10  3/uL      Abs Mono Automated 0.60 x10 3/uL      Abs Eos Automated 0.25 x10 3/uL      Absolute Baso Automated 0.08 x10 3/uL      Absolute Immature Granulocyte Unmeasured x10 3/uL     PT/APTT [[id number]] Collected:  07/13/14 0617     PT 13.6 sec Updated:  07/13/14 0757     PT INR 1.1      PT Anticoag. Given Within 48 hrs. None      PTT 32 sec          ------------------------------------------------------------------------------------------------------------------    Recent Labs  Lab 07/13/14  0617   PT INR 1.1  Cardiac Enzymes      ------------------------------------------------------------------------------------------------------------------      Micro Results  @microrslt10 @  Radiology Reports  Ct Head Wo Contrast    07/01/2014   History: Altered mental status.  FINDINGS: Brain CT without intravenous contrast. Correlation with a brain MRI dated February 11, 2004.  There is again diffuse parenchymal volume loss. There is ventriculomegaly which is slightly out of proportion to volume loss. The ventriculomegaly involving also have slightly progressed since the prior examination. There is hypodensity in the supratentorial white matter consistent with moderate chronic small vessel ischemic disease, also slightly more prominent than previously. There is again hypodensity in the subinsular regions, particularly on the left, chronic small vessel ischemic in nature as well. There is no mass effect, acute intracranial hemorrhage. The ventricular system and cisterns are normally configured. The visualized paranasal sinuses and calvarium are unremarkable.     07/01/2014    1. 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 2. Volume loss, ventriculomegaly which is slightly out of proportion to volume loss. This has progressed since the prior examination.  Terrilee Croak, MD  07/01/2014 4:36 PM     Scheduled Meds:  Current Facility-Administered Medications   Medication Dose  Route Frequency   . ARIPiprazole  2 mg Oral BID Meals   . folic acid  1 mg Oral Daily   . lactobacillus/streptococcus  1 capsule Oral Daily   . levETIRAcetam  500 mg Oral Q12H SCH   . multivitamin  1 tablet Oral Daily   . thiamine  100 mg Oral Daily     Continuous Infusions:   PRN Meds:.acetaminophen, benztropine mesylate, haloperidol, haloperidol lactate, LORazepam, TdaP Booster, traZODone, ziprasidone      Delle Reining NP on 07/13/2014 at 8:35 AM

## 2014-07-13 NOTE — Progress Notes (Signed)
Spoke to  [facility] regarding admission for this pt.  Corrie Dandy is at:  [facility]  Address: [address] [place], [place] [address]   Phone: [phone]  Corrie Dandy will fax the paperwork for admission to [place].  She needs the son to do his admission paperwork before pt can be admitted.      LVM for son JAMESHA ELLSWORTH [phone] asking for an update regarding pt's admission to ALF.

## 2014-07-13 NOTE — Progress Notes (Signed)
Client attended creative therapy and group therapy sessions today. Client was oriented x4. Client was able to complete tasks and provide feedback to group members about their tasks.

## 2014-07-13 NOTE — Progress Notes (Signed)
Pt was confused in am. Pt first statement to writer was " You have to help me get dis charged today. " my twin died 10/29/22 the funeral was 31-Oct-2022 my son's made all the arrangements, but they are not handling it well they need me " Pt was anxious agitated haldol 5 mg and Ativan 0.5 mg given po. Effective after 1 hour. Pt denies si/hi.

## 2014-07-13 NOTE — Plan of Care (Signed)
Problem: Psychosocial and Spiritual Needs  Goal: Demonstrates ability to cope with hospitalization/illness  Outcome: Progressing  Pt visible on the unit, very social with and supportive to peers, pleasant on approach by staff. She continues to have short term memory deficits, perseverating on finding out how her twin sister died, and being able to be discharged from the hospital. She was calm and in good behavioral control, has been writing things down in a journal that she wants to remember or to talk about with her treatment team. She rpeorts that she will have an interview tomorrow with staff from an assisted living facility in [place]. She is looking forward to this. She also reports she has also been looking for other facilities to live in if that doesn't work out. She denied current SI/HI/Hallucinations. She requested and received Trazadone 50 mg po prn for sleep at 2105. Will continue to monitor, provide encouragement and emotional support.

## 2014-07-14 NOTE — Progress Notes (Signed)
Pt attended all groups throughout the day. She was pleasant on approach, able to make her needs known and social with select peers. Pt contributed to group discussions and provided feedback during groups. Overall, stable.

## 2014-07-14 NOTE — Progress Notes (Signed)
July 14, 2014      According to the night shift, the patient was visible on the unit, social  and supportive to peers, pleasant upon approach by staff.  She continued to  have short-term memory deficits, talking more about housing and the death  of her twin sister.  She was calm and in good behavioral control, writing  things down in a journal in order to remember things she discussed.  She  reported having an interview tomorrow with staff from an assisted living  facility in [place] and she expressed looking forward to this.  When I  met with the patient today, she said she had the interview.  The person who  met with her was very professional and friendly, and she felt very positive  about the plan.  She asked if there was anything else she needed to do to  facilitate progress in following through with the program.  I said not at  this time, but we would let her know.  She was pleasant, friendly and  seemed happy.      PSYCHIATRIC SPECIALTY EXAMINATION:  VITAL SIGNS:  See RN assessment reviewed by me.  MUSCULOSKELETAL:  She demonstrates full range of motion with no  choreoathetoid movements, akathisia, or tics.     MENTAL STATUS EXAMINATION:  She presented as a well-developed, well-nourished woman who appeared to be  her stated age.  She was well groomed and demonstrated good hygiene.  She  was alert, cooperative, made intermittent eye contact and maintained  interest in the interview.  She demonstrated no psychomotor agitation,  retardation, or movement problems.  Her speech was goal directed, not  pressured.  Her affect was pleasant, of normal range and mood congruent.   She denied and demonstrated no evidence of suicidal, homicidal, or  psychotic ideation.  She was alert and oriented, although she continued to  have marked memory problems.  Insight, motivation, and judgment, use of  language, fund of knowledge fair to poor.     ASSESSMENT:  Unspecified cognitive disorder, history of unspecified  alcohol-related  disorder with recent delirium, showing some improvement.     PLAN:  We will continue medications as ordered and support progress in that she is  being transferred to the facility in [place].     Estimated floor time 25 minutes of which at least half was spent in the  coordination of care and counseling,  which included talking with patient,  reviewing records and talking with staff.

## 2014-07-14 NOTE — Plan of Care (Signed)
Problem: Psychosocial and Spiritual Needs  Goal: Demonstrates ability to cope with hospitalization/illness  Outcome: Progressing  Pt in her room reading in bed at the start of the shift. She was pleasant, calm, and oriented x 4. She reported that she had her interview with the staff of the assisted living today, and that they were "very nice and laid back, it went really well." Pt was not perseverating on her sister or other topics this shift. Of concern, she did show this RN what appears to be some kind of mass or hernia in her mid abdomen, above the umbilicus, that she says has increased in size. There are also some small veins visible in that area. Please have NP or internist evaluate. Pt denied current SI/HI/Hallucinations. She remains somewhat depressed. She requested and received Trazadone 50 mg po prn at 2117 for sleep, as well as Tylenol 650 mg po prn for headache. Will continue to monitor, provide encouragement and emotional support.

## 2014-07-14 NOTE — Progress Notes (Signed)
July 12, 2014.       According to the night shift, the patient continued to complain about about  wanting to leave to go back to her home.  She remained somewhat confused  about the time of her twin sister's death.  While she had been angry with  the day shift, the night shift she was not verbally outwardly angry.  She  did complain of abdominal pain.  She said she slept for about 8 hours.   When I met with the patient today during the day she approached me  regarding wanting to go home.  We reviewed again her problem with memory  and how because of that she could not be at home alone.  I did check her  memory, she was able to do better than usual and she remembered 1 of 3  objects after 5 minutes.  She seemed a little bit more correct with some  details of the like and that she knew we were keeping him here because we  do not think she is safe.  I supported her concerns reminding her that  while she is a very intelligent woman, she has memory trouble, which she  acknowledges and because of that is unsafe alone.       PSYCHIATRIC SPECIALTY EXAMINATION:  VITAL SIGNS:  See RN assessment reviewed by me.    MUSCULOSKELETAL:  She demonstrated full range of motion with no  choreoathetoid movements, akathisia or tics.     MENTAL STATUS EXAMINATION:  She presented as a well-developed, well-nourished woman who appeared to be  her stated age.  She was well groomed and demonstrated good hygiene.  She  was alert, cooperative, made intermittent eye contact and maintained  interest in the interview.  She demonstrated mild psychomotor agitation  without retardation or movement problems.  Her speech was goal-directed in  phrases.  She remains somewhat confused about the situation.  Insight,  motivation and judgment, fund of knowledge fair to poor.     ASSESSMENT:  Unspecified cognitive disorder, history of unspecified alcohol-related  disorder with recent delirium.     PLAN:  We will continue medications as ordered and support our  staff's attempts to  find placement.  Estimated floor time 25 minutes of which at least half was  spent in coordination of care and counseling,  including talking with the  patient, reviewing records, talking with staff.

## 2014-07-14 NOTE — Plan of Care (Addendum)
Problem: Psychosocial and Spiritual Needs  Goal: Demonstrates ability to cope with hospitalization/illness  Outcome: Progressing  Intervention: Encourage verbalization of feelings/concerns/expectations  Patient presented alert, cooperative, pleasant, social, smiling with recent memory impairment. Denied SI/HI/AVH. Visible in the lobby for most of the shift,  Attended groups, social with peers. Will continue to monitor.

## 2014-07-15 ENCOUNTER — Inpatient Hospital Stay: Payer: 59

## 2014-07-15 MED ORDER — ARIPIPRAZOLE 5 MG PO TABS
2.5000 mg | ORAL_TABLET | Freq: Two times a day (BID) | ORAL | Status: DC
Start: 2014-07-15 — End: 2014-07-21
  Administered 2014-07-15 – 2014-07-21 (×12): 2.5 mg via ORAL
  Filled 2014-07-15 (×12): qty 1

## 2014-07-15 NOTE — Plan of Care (Signed)
Problem: Safety  Goal: Patient will be free from injury during hospitalization  Outcome: Progressing  Patient spend most of the day on the lobby socializing with peers. Pt participated in groups. Pt calm and cooperative this shift. Pt took all medications without difficulty. Pt denies SI/HI or AH/VH. Safety check per unit protocol. Will cont to monitor.

## 2014-07-15 NOTE — Progress Notes (Signed)
July 15, 2014     SUBJECTIVE AND OBJECTIVE:  According to the night shift, the patient was pleasant, calm and oriented.   She spoke positively about her interview with the assisted living facility  representatives.  She denied suicidal, homicidal, psychotic ideation.  She  appeared to have a good night.  When I met with the patient today, she was  happy and positive.  She was able to talk about meeting from the day before  with the assisted living representative and spoke about it in a general,  but positive manner.  She said she is trying to decide whether to go there  or to go back to her home with help on a daily basis.  I reminded her that  if she were to go home, she would need to have coverage 24/7 because of her  memory problem.  She insisted that she needs some independence.  I agreed  with her, but said she also needs to be in a situation where she is kept  safe.       PSYCHIATRIC SPECIALTY EXAMINATION:  VITAL SIGNS:  See RN assessment reviewed by me.  MUSCULOSKELETAL:  She demonstrates full range of motion with no  choreoathetoid movements, akathisia, or tics.     MENTAL STATUS EXAMINATION:  She presented as a well-developed, well-nourished woman who appeared to be  her stated age.  She was well groomed and demonstrated good hygiene.  She  was alert, cooperative, made intermittent eye contact and maintained  interest in the interview.  She demonstrated no psychomotor agitation,  retardation or movement problems.  Her speech was goal directed,  nonpressured, of normal rate and rhythm.  Her affect was pleasant and  spontaneous.  She denied and demonstrated no evidence of suicidal,  homicidal, or psychotic ideation.  She was alert and oriented x3.  Insight,  motivation and judgment, poor.  Use of language, fund of knowledge fair.   Overall, she looks better than she has looked in the past.  This has been a  gradual improvement.     ASSESSMENT:  Unspecified cognitive disorder, history of unspecified  alcohol-related  disorder with recent delirium, showing some improvement.     PLAN:  We will increase the Abilify to 2.5 mg 2 times daily and monitor patient's  response.     Estimated floor time 25 minutes of which at least half was spent in  coordination of care and counseling, which included talking with patient,  reviewing records, talking with staff.

## 2014-07-16 NOTE — Plan of Care (Signed)
Problem: At Risk for Suicide / Harm to Self AS EVIDENCED BY...  Goal: Suicide Alert Level 2: to maintain patient safety during hospitalization  Intervention: Every 15 minute checks  Pt educated to ask staff for help.  Pt denies SI HI at this time.  15 minute rounding continued.

## 2014-07-16 NOTE — Progress Notes (Signed)
CM phoned Pt's son HARMONY SANDELL [phone]  To discuss pt's discharge.  Pt's son reported that he has been trying to fill out paperwork daily, because it is so much paperwork and that he has to mail paper work to facility because he cannot drive it out to [place].  CM informed Pt's son that Monday was discharge day and that this is the day the facility can accept her and that the paperwork needs to be given to them prior to this date.

## 2014-07-16 NOTE — Progress Notes (Signed)
Bessy Maulding CSN:[id number],MRN:[id number] is a 64 y.o. female,  Outpatient Primary MD for the patient is No primary care provider on file.   No chief complaint on file.     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  cxr ordered r/o tb otherwise medically stable for transfer out of [facility] to assisted living from a medical prospective if acceptable with psychiatrist.  Family aware.    Date of service 07/15/14  Subjective:   Rosaleigh Rattan today has, No headache, No chest pain, No abdominal pain - No Nausea, No new weakness tingling or numbness, No Cough - SOB  Objective:     Filed Vitals:    07/14/14 0616 07/14/14 0825 07/15/14 0619 07/16/14 0559   BP: 95/70 99/74 103/74 107/68   Pulse: 80 89 75 70   Temp: 97.5 F (36.4 C)  97 F (36.1 C) 97.7 F (36.5 C)   TempSrc: Oral  Oral Oral   Resp:       Height:       Weight:       SpO2: 94%  98% 95%     Wt Readings from Last 3 Encounters:   06/30/14 66.679 kg (147 lb)     No intake or output data in the 24 hours ending 07/16/14 0852  Exam  Awake Alert, Oriented   HEENT nl  Lungs CTA  s1s2 no murmur  +ve B.Sounds, Abd Soft, Non tender , No rebound -guarding or rigidity.less abd distention  No Cyanosis, Clubbing or edema, No new Rash or bruise   Neuro grossly intact   abrasin to left arm; well healed     Data Review     Results     Procedure Component Value Units Date/Time    CBC and differential [[id number]]  (Abnormal) Collected:  07/13/14 0617    Specimen Information:  Blood / Blood Updated:  07/13/14 0800     WBC 4.83 x10 3/uL      Hgb 14.1 g/dL      Hematocrit 40.9 %      Platelets 308 x10 3/uL      RBC 4.17 (L) x10 6/uL      MCV 99.5 fL      MCH 33.8 (H) pg      MCHC 34.0 g/dL      RDW 11 (L) %      MPV 10.3 fL      Neutrophils 57 %      Lymphocytes  Automated 24 %      Monocytes 12 %      Eosinophils Automated 5 %      Basophils Automated 2 %      Immature Granulocyte Unmeasured %      Nucleated RBC Unmeasured /100 WBC      Neutrophils Absolute 2.74 x10 3/uL      Abs Lymph Automated 1.16 x10 3/uL      Abs Mono Automated 0.60 x10 3/uL      Abs Eos Automated 0.25 x10 3/uL      Absolute Baso Automated 0.08 x10 3/uL      Absolute Immature Granulocyte Unmeasured x10 3/uL     PT/APTT [[id number]] Collected:  07/13/14 0617     PT 13.6 sec Updated:  07/13/14 0757     PT INR  1.1      PT Anticoag. Given Within 48 hrs. None      PTT 32 sec          ------------------------------------------------------------------------------------------------------------------    Recent Labs  Lab 07/13/14  0617   PT INR 1.1       Cardiac Enzymes      ------------------------------------------------------------------------------------------------------------------      Micro Results  @[email or web address] @  Radiology Reports  Xr Chest 2 Views    07/15/2014   CLINICAL HISTORY: Placement  COMPARISON: None  PA and lateral views of the chest were performed.  FINDINGS:   The lungs are well inflated and clear.  The cardiac silhouette, pulmonary vascularity and mediastinum are unremarkable.  The visualized bones are within normal limits for the patient's age.     07/15/2014    No acute cardiopulmonary disease.  Annamary Carolin, MD  07/15/2014 10:37 AM     Xr Abdomen Ap    07/13/2014   Clinical History: Abdominal distention.  Findings: Supine view of the abdomen.   Gas and stool are present throughout portions of the colon. No dilated loops of bowel. Small pelvic calculations are likely vascular in the absence of symptoms to suggest distal ureteral or bladder calculi. Staples over the right upper quadrant. Mild degenerative changes in the lumbar spine.     07/13/2014   Impression: Unremarkable bowel gas pattern.  Darra Lis, MD  07/13/2014 2:49 PM     Ct Head Wo Contrast    07/01/2014   History: Altered mental  status.  FINDINGS: Brain CT without intravenous contrast. Correlation with a brain MRI dated February 11, 2004.  There is again diffuse parenchymal volume loss. There is ventriculomegaly which is slightly out of proportion to volume loss. The ventriculomegaly involving also have slightly progressed since the prior examination. There is hypodensity in the supratentorial white matter consistent with moderate chronic small vessel ischemic disease, also slightly more prominent than previously. There is again hypodensity in the subinsular regions, particularly on the left, chronic small vessel ischemic in nature as well. There is no mass effect, acute intracranial hemorrhage. The ventricular system and cisterns are normally configured. The visualized paranasal sinuses and calvarium are unremarkable.     07/01/2014    1. 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 2. Volume loss, ventriculomegaly which is slightly out of proportion to volume loss. This has progressed since the prior examination.  Terrilee Croak, MD  07/01/2014 4:36 PM     Scheduled Meds:  Current Facility-Administered Medications   Medication Dose Route Frequency   . ARIPiprazole  2.5 mg Oral BID Meals   . folic acid  1 mg Oral Daily   . lactobacillus/streptococcus  1 capsule Oral Daily   . levETIRAcetam  500 mg Oral Q12H SCH   . multivitamin  1 tablet Oral Daily   . thiamine  100 mg Oral Daily     Continuous Infusions:   PRN Meds:.acetaminophen, benztropine mesylate, haloperidol, haloperidol lactate, LORazepam, TdaP Booster, traZODone, ziprasidone      Delle Reining NP on 07/16/2014 at 8:52 AM

## 2014-07-16 NOTE — Progress Notes (Signed)
Pt states she just wants to go home. Pt has been cooperative went to groups with participation. Pt is forgetful and needs redirect at times. No PRNs given this shift.

## 2014-07-16 NOTE — Plan of Care (Addendum)
Problem: Safety  Goal: Patient will be free from injury during hospitalization  Intervention: Provide and maintain safe environment  At the start of the shift, the patient asked several staff if she could go home because her "sister just died this past weekend".  Pt stated she needed to go home and take care of "some things". She asked when she could go home, who her doctor was and if she saw her doctor today.  I looked up the doctor's note which verified that she did, indeed, see her doctor.  Pt was told that the note stated she would need to go into assisted living or go home if she had care that was 24/7 - around the clock.  Pt denied the need for this.  She was out in the lobby socializing with select peers and watching television.  She received her scheduled evening medication along with PRN Trazodone for sleep.  Pt rested in her bed but was not able to fall asleep until 0230.  She will continue to be followed throughout the evening with 15 minute checks for safety.  She appeared to sleep 4 hours.    PRN   Trazodone 50 mg @ 2131.

## 2014-07-16 NOTE — Progress Notes (Signed)
Pt attended all groups throughout the day with active participation. She is alert, ox3 and attentive in each group. Pt presents with bright affect and stable mood. Her speech is clear but she continues to have memory issues. Pt works very hard to participate in each group discussion and to complete the activity.

## 2014-07-16 NOTE — Progress Notes (Signed)
SUBJECTIVE AND OBJECTIVE FINDINGS:   The patient was seen today along with charge nurse, Ardis Hughs.  History  was obtained from the patient, nursing staff and medical records were  reviewed.  The patient stated "I feel fine, just get me home."  She stated  that she does not feel sad or depressed.  She denied any suicidal or  homicidal ideation.  She denied any paranoia or auditory or visual  hallucinations.  The patient remains confused.  According to the staff, the  patient is doing fairly well on the unit.  No behavioral problems have been  reported.  She is denying any suicidal or homicidal ideation and she slept  well last night.  She required trazodone to help with her insomnia.     PSYCHIATRIC SPECIALTY EXAMINATION:  VITAL SIGNS:  See RN report and assessment, reviewed by me.  MUSCULOSKELETAL:  The patient does not exhibit any rigidity or akathisia or  any other abnormal movements.  Her gait was normal.     MENTAL STATUS EXAMINATION:  This is a 64 year old Caucasian female who appears her stated age.  She was  dressed in casual attire.  Her grooming and hygiene were fair.  No evidence of  psychomotor agitation or retardation.  Her speech was soft and slow.  She  stated her mood was fine.  Her affect was constricted.  Thought process was  linear and goal-directed.  She denied and demonstrated no evidence of  suicidal, homicidal or psychotic ideation.  Her insight and judgment are  poor.     ASSESSMENT:  1.  Cognitive disorder, unspecified.  2.  Rule out alcohol-induced persistent dementia.     TREATMENT PLAN:  The patient will be continued on her current medications as per order of  Dr. Franchot Erichsen.  We will continue to monitor her mood and behavior and  adjust medications accordingly.     The patient is awaiting placement at this time.     Total time spent with patient, 15 minutes, at least half of which was spent  in coordination of care, counseling and reviewing medical records.

## 2014-07-17 NOTE — Plan of Care (Addendum)
Problem: Psychosocial and Spiritual Needs  Goal: Demonstrates ability to cope with hospitalization/illness  Outcome: Progressing  The patient became very irritated today because "nobody will tell me about my discharge plan."  The patient was given printed information about [facility] in [place], [place] and she was told that the anticipated date of discharge is this coming Monday depending on completion of paperwork by her son and her inpatient psychiatrist.  This satisfied the patient for the moment.  She is forgetful and anxious for discharge.  She approached staff later in the day with the same concerns she had earlier.   Will continue to monitor.

## 2014-07-17 NOTE — Plan of Care (Addendum)
Problem: Loss of functioning (Thought Disorder, Mood Disturbance and/or Severe Anxiety) AS EVIDENCED BY...  Goal: Demonstrates orientation to person place and time  Intervention: Monitor patients orientation status every shift  Patient in bed on first rounds, pleasant on approach, and cooperative with all interactions with Clinical research associate.  Patient continues to state her sister has just past, repeating her focused concern daily.  Poor memory, and periods of confusion noted.  Able to make her needs known to staff.  Patient appears to be packing up for pending discharge when proper arrangements are in place for her move to assisted living.  She continues to demonstrate her inability to live alone.  Requested her evening medications early, accepted in the evening around 2130.  Prn Trazodone 50 mg, and Ativan 0.5 mg for complaints of anxiety.  Patient fell asleep, and awaken around 0100, asking if she received her sleeping medication.  Patient asking for a print out of all her medications so she can given to her new care givers.  Explained these would all be given upon discharge, so she would have a copy with her.  She return to bed, and slept broken 6 hours total for the shift.  Will continue to monitor for safety, and progress.

## 2014-07-17 NOTE — Progress Notes (Signed)
Pt attended all groups throughout the day with active participation. She was alert ox3 and attentive. Pt presents with flat affect but stable mood. At times she becomes frustrated with her hospital stay and her inability to leave. Pt believes she is voluntarily in the hospital and had other patients concerned about her not being able to leave. During group, she had moments of clarity and was able to participate in the activity and the discussion. Her responses were well thought out and relevant to the discussions.

## 2014-07-18 MED ORDER — BENZTROPINE MESYLATE 0.5 MG PO TABS
0.5000 mg | ORAL_TABLET | Freq: Four times a day (QID) | ORAL | Status: DC | PRN
Start: 2014-07-18 — End: 2014-07-21

## 2014-07-18 MED ORDER — TRAZODONE HCL 50 MG PO TABS
50.0000 mg | ORAL_TABLET | Freq: Every evening | ORAL | Status: DC | PRN
Start: 2014-07-18 — End: 2014-07-19

## 2014-07-18 MED ORDER — LOPERAMIDE HCL 2 MG PO CAPS
2.0000 mg | ORAL_CAPSULE | Freq: Four times a day (QID) | ORAL | Status: DC | PRN
Start: 2014-07-18 — End: 2014-07-21

## 2014-07-18 MED ORDER — ONDANSETRON 4 MG PO TBDP
4.0000 mg | ORAL_TABLET | Freq: Three times a day (TID) | ORAL | Status: DC | PRN
Start: 2014-07-18 — End: 2014-07-21

## 2014-07-18 MED ORDER — DOCUSATE SODIUM 100 MG PO CAPS
100.0000 mg | ORAL_CAPSULE | Freq: Two times a day (BID) | ORAL | Status: DC | PRN
Start: 2014-07-18 — End: 2014-07-21

## 2014-07-18 NOTE — Plan of Care (Addendum)
Problem: Loss of functioning (Thought Disorder, Mood Disturbance and/or Severe Anxiety) AS EVIDENCED BY...  Goal: Performs ADL's Independently  Outcome: Progressing  The patient is able to perform all ADL's.  She is independent, but forgetful and perseverating about her discharge plan in spite of having been reminded of the plan on numerous occasions.  She complained of a headache at 1815.  Tylenol PRN was administered.  She also complained of an abdominal bulge that moves.  She was advised to wait for the internal medicine practitioner and mention it at that time.  Will continue to monitor.

## 2014-07-18 NOTE — Plan of Care (Signed)
Problem: Loss of functioning (Thought Disorder, Mood Disturbance and/or Severe Anxiety) AS EVIDENCED BY...  Goal: Demonstrates orientation to person place and time  Intervention: Monitor patients orientation status every shift  Patient in her room on first rounds.  Mood improved since she realizes that her discharge is in the works.  Pleasant with staff, and peers.  Patient repeats her request often, and short term memory impaired.  Spoke once again, usually everyday tells the same stories over, and over.  She denies SI/HI, and no hallucinations of any kind.  Remote memories appear intact, and repeats them often.  She is waiting for her discharge, and anxious about her new home.  She requested her Ativan 0.5 mg for her anxiety with her night time meds.  Accepted prn Trazodone 50 mg, but her sleep has been broken for the past 2 nights, will request repeat from [facility].  Slept 7 hours total.  Will continue to monitor her safety for pending discharge.

## 2014-07-19 MED ORDER — TRAZODONE HCL 100 MG PO TABS
100.0000 mg | ORAL_TABLET | Freq: Every evening | ORAL | Status: DC | PRN
Start: 2014-07-19 — End: 2014-07-21
  Administered 2014-07-19 – 2014-07-20 (×2): 100 mg via ORAL
  Filled 2014-07-19 (×2): qty 1

## 2014-07-19 NOTE — Progress Notes (Signed)
Virginia Reid CSN:[id number],MRN:[id number] is a 64 y.o. female,  Outpatient Primary MD for the patient is No primary care provider on file.   No chief complaint on file.     Assessment & Plan   -Abdominal distension with spontaneous bruising marks improved with Probiotics. PT/PTT/ Platelets normal   -AMS improved. 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  Labs overall negative.  Negative RPR & vitamin B12, folate are normal.   Hx chronic alcohol drinking, passing out, falling, hitting head, hx tia in past.       -chronic alcoholism with hx DT. seen neuro at [facility]; mvi, folate. con't keppra. 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 Denies any nausea, vomiting, constipation. Has increased bloating, No headache, No chest pain, No abdominal pain - No Nausea, No new weakness tingling or numbness, No Cough - SOB. "I am ready to go home". Reports that she is upset by the death of her twin sister  Objective:     Filed Vitals:    07/16/14 0559 07/17/14 0626 07/18/14 0625 07/19/14 0638   BP: 107/68 106/73 111/84 112/82   Pulse: 70 73 93 88   Temp: 97.7 F (36.5 C) 97.2 F (36.2 C) 97.7 F (36.5 C) 97.3 F (36.3 C)   TempSrc: Oral Oral Oral Oral   Resp:       Height:       Weight:       SpO2: 95% 98% 98% 99%     Wt Readings from Last 3 Encounters:   06/30/14 66.679 kg (147 lb)     No intake or output data in the 24 hours ending 07/19/14 2045  Exam  Awake Alert, Oriented x 3   HEENT nl  Lungs CTA  s1s2 no murmur  +ve B.Sounds, Abd Soft, Non tender , No rebound -guarding or rigidity.  No pedal edema  Left FA cut healing ; no secondary infection   Neuro grossly intact     Data Review     Results     Procedure Component Value Units Date/Time    Magnesium [[id number]] Collected:  07/02/14 0550     Specimen Information:  Blood Updated:  07/02/14 0631     Magnesium 2.5 mg/dL     Folate [[id number]] Collected:  07/02/14 0550    Specimen Information:  Blood Updated:  07/02/14 0550    Vitamin B12 [[id number]] Collected:  07/02/14 0550    Specimen Information:  Blood Updated:  07/02/14 0550    RPR [[id number]] Collected:  07/02/14 0550    Specimen Information:  Blood Updated:  07/02/14 0550    CBC and differential [[id number]]  (Abnormal) Collected:  07/01/14 1118    Specimen Information:  Blood / Blood Updated:  07/01/14 1211     WBC 5.38 x10 3/uL      Hgb 12.8 g/dL      Hematocrit 36.6 %      Platelets 290 x10 3/uL      RBC 3.70 (L) x10 6/uL      MCV 103.0 (H) fL      MCH 34.6 (H) pg      MCHC 33.6 g/dL      RDW 12 %      MPV 10.3 fL      Neutrophils  63 %      Lymphocytes Automated 16 %      Monocytes 17 %      Eosinophils Automated 3 %      Basophils Automated 1 %      Immature Granulocyte Unmeasured %      Nucleated RBC Unmeasured /100 WBC      Neutrophils Absolute 3.40 x10 3/uL      Abs Lymph Automated 0.86 x10 3/uL      Abs Mono Automated 0.90 x10 3/uL      Abs Eos Automated 0.17 x10 3/uL      Absolute Baso Automated 0.05 x10 3/uL      Absolute Immature Granulocyte Unmeasured x10 3/uL     Comprehensive metabolic panel [[id number]]  (Abnormal) Collected:  07/01/14 1118    Specimen Information:  Blood Updated:  07/01/14 1152     Glucose 99 mg/dL      BUN 40.9 mg/dL      Creatinine 0.8 mg/dL      Sodium 811 mEq/L      Potassium 4.2 mEq/L      Chloride 106 mEq/L      CO2 24 mEq/L      CALCIUM 10.3 mg/dL      Protein, Total 7.3 g/dL      Albumin 4.1 g/dL      AST (SGOT) 36 (H) U/L      ALT 42 U/L      Alkaline Phosphatase 52 U/L      Bilirubin, Total 0.3 mg/dL      Globulin 3.2 g/dL      Albumin/Globulin Ratio 1.3      Anion Gap 10.0     GFR [[id number]] Collected:  07/01/14 1118     EGFR >60.0 Updated:  07/01/14 1152          ------------------------------------------------------------------------------------------------------------------    Recent Labs  Lab 07/13/14  0617   PT INR 1.1       Cardiac Enzymes      ------------------------------------------------------------------------------------------------------------------      Micro Results  @[email or web address] @  Radiology Reports  Ct Head Wo Contrast    07/01/2014   History: Altered mental status.  FINDINGS: Brain CT without intravenous contrast. Correlation with a brain MRI dated February 11, 2004.  There is again diffuse parenchymal volume loss. There is ventriculomegaly which is slightly out of proportion to volume loss. The ventriculomegaly involving also have slightly progressed since the prior examination. There is hypodensity in the supratentorial white matter consistent with moderate chronic small vessel ischemic disease, also slightly more prominent than previously. There is again hypodensity in the subinsular regions, particularly on the left, chronic small vessel ischemic in nature as well. There is no mass effect, acute intracranial hemorrhage. The ventricular system and cisterns are normally configured. The visualized paranasal sinuses and calvarium are unremarkable.     07/01/2014    1. 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 2. Volume loss, ventriculomegaly which is slightly out of proportion to volume loss. This has progressed since the prior examination.  Terrilee Croak, MD  07/01/2014 4:36 PM     Scheduled Meds:  Current Facility-Administered Medications   Medication Dose Route Frequency   . ARIPiprazole  2.5 mg Oral BID Meals   . folic acid  1 mg Oral Daily   . lactobacillus/streptococcus  1 capsule Oral Daily   . levETIRAcetam  500 mg Oral Q12H SCH   . multivitamin  1 tablet Oral Daily   .  thiamine  100 mg Oral Daily     Continuous Infusions:   PRN Meds:.acetaminophen, benztropine, benztropine mesylate, docusate  sodium, haloperidol, haloperidol lactate, loperamide, LORazepam, ondansetron, TdaP Booster, traZODone, ziprasidone      Carmelia Bake MD on 07/19/2014 at 8:45 PM

## 2014-07-19 NOTE — Plan of Care (Signed)
Problem: Safety  Goal: Patient will be free from injury during hospitalization  Intervention: Hourly rounding.  Pt. assessed for thoughts of self harm. She denies

## 2014-07-19 NOTE — Progress Notes (Incomplete)
Pt. OOB and in lobby most of day, socializing with peers. She denies SI/hallucinations.

## 2014-07-19 NOTE — Progress Notes (Signed)
Pt. OOB in lobby most of day interacting with peers. She denies SI and continues to have loss of recent memory. She is excited to go to assisted living tomorrow. Good appetite. Monitored for safety and no prn's

## 2014-07-19 NOTE — Progress Notes (Signed)
July 18, 2014     The patient is seen today by me for followup at [facility]     SUBJECTIVE FINDINGS:  The patient reports her mood is "up and down."  She describes her up and  down mood as feeling quite to feeling depressed.  She reports her appetite  is good and her sleep is fine.  She reports that she sometimes has  difficulty recalling words.  She also has mild memory problems as per her  report.  She denies any sleep disturbances.  She denies any feelings of  hopelessness or helplessness or worthlessness.  She reports that she  engages in crying spells situationally and she stated she has been grieving  over the death of her twin sister last week.  She states she was not able  to attend the funeral due to being hospitalized.  She denies any symptoms  consistent with hypomania or psychosis.  She reports that she had lost  about 5 pounds of weight prior to hospitalization.  She reports her energy  has been fair and her ability to enjoy things and motivation are moderate.   Her focus and concentration are moderate.  She denies any current suicidal  or homicidal ideations.  She is able to stay safe on the unit.  She denies  any thoughts of self-mutilation.  She reports decreased cravings with  alcohol.  She denies any anxiety or panic attacks.  She reports compliance  with medications.  As per medical records, the patient is currently  awaiting transfer to assisted living facility.  She stated that she would  like to return back to her home.  She reports feeling safe staying in the  hospital.     VITAL SIGNS:    Temperature of 97.7, heart rate 93, respiratory rate 16, blood pressure  111/84.  Oxygen saturation 98% room air, height of 5 feet 0.98 inches,  weight of 66.7 pounds.       NURSING REPORT:    States that patient's mood improved and she realizes that her discharge is  in place and she repeatedly asked questions.  Short-term memory is  impaired.  She denied any suicidal or homicidal ideations.  No auditory  or  visual hallucinations reported.  Remote memory appears to be intact;  however, short-term memory is impaired.  She slept 7 hours with the help of  trazodone and Ativan     LABORATORY DATA:  No new laboratory data available.     CURRENT MEDICATIONS:  Abilify 2.5 mg twice a day, folic acid 1 mg daily, RisaQuad 1 capsule  daily, Keppra 500 mg q.12 h., multivitamin 1 tablet daily, thiamine 100 mg  daily.     SIDE EFFECTS:  The patient denies any side effects to her current psychotropic medication  regimen.     REVIEW OF SYSTEMS:   MUSCULOSKELETAL:  No EPS, NMS, or tardive dyskinesia, tics, tremors noticed  or reported.  ABDOMEN:  The patient reports that she has a bulging mass from her abdomen.        MENTAL STATUS EXAMINATION:  The patient looks stated age, has short built, cooperative, has fair  grooming, fair hygiene and fair eye contact and is moderately overweight.   No psychomotor agitation or retardation noticed or reported.  She has a  normal gait and station.  She demonstrated no rigidity or flaccidity or  akathisia or choreoathetoid movements, tics, or tremors.  She has a normal  rate, tone, volume rhythm and latency of  speech.  Mood "is up and down."   Affect is constricted.  Thought content is negative for any suicidal or  homicidal ideations.  Negative for anxiety.  Negative for PTSD or OCD  symptoms.  Negative for hypomania.  Negative for auditory or visual  hallucinations or paranoid delusions.  Thought process is linear, logical,  and goal-directed.  Associations are intact.  She has slowly improving  insight and slowly improving judgment into her illness.  She is alert and  oriented to day, month, and year, but not date.  Her short memory seems to  be impaired.  She has poor attention, focus and concentration.  Language is  fluent.  Fund of knowledge appears adequate.  Intelligence is estimated to  be average.     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     LABORATORY DATA:  No new laboratory data available.  Her vitamin B12 and folate are within  normal limits.  Her CBC is within normal limits.  Her RPR is nonreactive.   Magnesium level is 2.5.  AST has been mildly elevated at 36 as of July 01, 2014 and GFR is greater than 60.     ASSESSMENT AND PLAN:  The patient is recommended to continue being admitted at [facility] on a  voluntary basis for acute stabilization of mood and psychosis.  The patient  will be monitored for safety and treatment course every 15 minutes by the  nursing staff.   The patient will be engaged in supportive therapeutic  milieu.  She will attend both individual and group activities as tolerated.   After discussing with the patient about risks versus benefits of  medications as well as alternative treatments, the patient stated that she  would like to continue her current medications at current doses for now.  I  will continue to monitor patient's prognosis and treatment and adjust the  medications accordingly.  Internal medicine will follow up with the patient  to address any acute or chronic medical issues.  The patient had endorsed  that she has a bulging mass in her abdomen and patient will be followed up  with internal medicine to address this.  We will continue all other home  medications as currently prescribed.  We will attempt to obtain collateral  information from the patient's outpatient treatment providers, family, and  friends.  Ativan, Haldol, and Benadryl will be used on an as-needed basis  for severe agitation and severe anxiety only.  The patient will meet with  the social worker and case management team in order to prepare for a safe  discharge.     Expected length of stay is 3 to 5 days.     Total floor time spent with this patient today is 25 minutes, more than 50%  of the time is spent with the patient, discussing about the symptoms,  medications, side effects of medications,  performing mental status  examination, counseling the patient about importance of compliance with  medications and treatment recommendations and also reviewing the medical  records that are available in the Epic including laboratory data and also  coordination of care with the nursing staff.  The patient is recommended to  maintain adequate nutrition and hydration.  The patient is waiting to be  transferred to assisted living facility upon availability.     SIDE EFFECTS:  The patient denies any side effects to her current psychotropic medication  regimen.

## 2014-07-19 NOTE — Plan of Care (Signed)
Problem: Loss of functioning (Thought Disorder, Mood Disturbance and/or Severe Anxiety) AS EVIDENCED BY...  Goal: Demonstrates orientation to person place and time  Intervention: Re-orient the patient to environment as needed  Continues to perseverate about her discharge.  Spoke with patient in length in a supportive role promoting her Son's and Dr's decision to go to a facility instead of be isolative at home.  She has poor insight, stating she wants to get a job.  Became very angry, and frustrated about not having the choice to go back home, and searching for someone to come into her home on a daily basis to monitor her care, and safety.  Medicated with Ativan 0.5 mg po for anxiety 5/10 prior to going to bed.  Made several attempts with this writer to express her need to return HOME.   She retired to her room, and fell asleep after reading for approximately a hour.  Dr. Hartford Poli called for a repeat Trazodone, due to her poor sleep previous nights, but she refused when offered.  She does have poor insight into her memory loss, and and states she is angry because she has not been included in these meetings with her Son.  Slept 6 hours broken.  Will continue to monitor her safety.

## 2014-07-20 NOTE — Progress Notes (Signed)
July 20, 2014     SUBJECTIVE AND OBJECTIVE:  According to night shift, the patient was pleasant on approach by staff and  is social with peers.  She was seen as being somewhat confused, which was  associated with severe recent memory deficit.  She was reoriented as needed  throughout the shift.  She spoke of being excited about leaving on Monday  and noted the family would be meeting about it in the morning.  She spoke  of not knowing whether she would be going to assisted living facility or  back to her home.  When I met with the patient today, she was pleasant and  friendly.  She expressed upset about being told she would be going to  assisted living and continued previously described argument about her being  able to take care of herself.  She spoke of wanting to go home and having  someone come in to check on her every day or every other day.  When I told  her that was not an option, because her current memory was so poor that we  could not trust that she would be safe if she was not supervised, she  explained that she has not changed at all over several years, and I  acknowledged that that may be true, but at this point, legally we cannot  discharge her to any situation other than one that provides constant  supervision.  She was irritable, but appropriate in the degree of her  irritability.  She seemed to recover from this quite quickly, possibly  related to her memory problem.  I later heard from case management that she  would not be discharged today because her son had not completed some of the  paperwork that needs to be completed.  We will plan for discharge tomorrow.     PSYCHIATRIC SPECIALTY EXAMINATION:  VITAL SIGNS:  She RN assessment, reviewed by me.  MUSCULOSKELETAL:  She demonstrated full range of motion with no  choreoathetoid movements, akathisia, or tics.     MENTAL STATUS EXAMINATION:  She presented as a well-developed, well-nourished woman who appeared to be  her stated age.  She was well  groomed and demonstrated good hygiene.  She  was alert, cooperative, made intermittent eye contact, and maintained  interest in the interview.  She demonstrated no psychomotor agitation,  except mildly, when she was discussing not wanting to go to assisted  living, but this was appropriate for content.  She demonstrated no  suicidal, homicidal, or psychotic ideation.  She was alert and oriented.   She knew the date.  She knew the situation.  Insight, motivation, judgment,  use of language, fund of knowledge fair, except for memory and how it would  affect her judgment.     ASSESSMENT:  Unspecified cognitive disorder, history of unspecified alcohol-related  disorder with recent delirium, showing some improvement.     PLAN:  Will continue medications as ordered and plan for discharge tomorrow.     Estimated floor time, 25 minutes, of which at least half was spent in the  coordination of care and counseling, including talking to the patient,  reviewing records and talking with staff.

## 2014-07-20 NOTE — Progress Notes (Signed)
Pts son  Called left message that mother will be discharged tomorrow and he will have to have the paper work done which was suppose to be done and faxed last week.

## 2014-07-20 NOTE — Plan of Care (Addendum)
Problem: Psychosocial and Spiritual Needs  Goal: Demonstrates ability to cope with hospitalization/illness  Outcome: Progressing  Intervention: Encourage verbalization of feelings/concerns/expectations  Presented cooperative and pleasant. Continued to be forgetful. Visible in the lobby, social with peers. Denied SI/HI/AVH. Took all scheduled medications without difficulty. Received TDP Boostirx vaccine. Patient is very upset and angry ; does not want to go to an assisted living facility after discharge. This RN , Dr Franchot Erichsen and case management explained to her that she has memory impairment and this is for her  Will continue to monitor.        Tylenol for headache and sinus pain 650 mg po @ 1758

## 2014-07-20 NOTE — Plan of Care (Signed)
Problem: Psychosocial and Spiritual Needs  Goal: Demonstrates ability to cope with hospitalization/illness  Outcome: Progressing  Pt visible on the unit at the start of the shift, social with select peers, pleasant on approach by staff. She remains confused at times, most likely due to her severe recent memory deficit. She was reoriented as needed through the shift. Pt denied current SI/HI/Hallucinations. She is excited to be leaving tomorrow; she reports there will be a meeting about it in the morning. She does not know whether she will be going to an assisted living facility or back to her house with in-home services. Pt requested an received Trazadone 100 mg po prn at 2110. Will continue to monitor, provide encouragement and emotional support.

## 2014-07-20 NOTE — Progress Notes (Signed)
July 19, 2014.     The patient is seen today by me at [facility] for followup.     SUBJECTIVE FINDINGS:  The patient reports her mood has been "okay."  She stated that her mood is  not great but not bad.  She stated she spoke to her younger son who lives  in [place].  She states she wants to return back to her home and  does not want to go to assisted living."  She reports her sleep has been  good; and then, she again stated that she has sleep disturbances and stated  that she had been experiencing sleep disturbances for a long time.  The  patient seems to be an unreliable and also a poor historian  .  She denies exhibiting  any suicidal or homicidal ideations, active or passive.  She denies any  self-mutilating thoughts or behaviors.  She denies experiencing auditory or  visual hallucinations or paranoid delusions.  She reports her energy has  been moderate, and her motivation and interests are fair.  She reports  experiencing early morning awakening at 4:00 a.m.  She reports appetite has  been good and denies any anxiety or panic attacks.  She reports she has  been crying a little due to grieving related to the death of her twin  sister a week ago.  She denies feeling withdrawn.  She reports feeling safe  on the unit.  She finds the peers on the unit as being supported.  She  denies any symptoms consistent with mania.  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  She reported moderate consumption of  Alcohol but medical reports indicated that pt had consumed excessive Etoh and had Hx of several blackouts resulting in falls and head trauma related to it.  The  last time she had used alcohol was a month ago.  She stated she had  consumed 1 glass of wine  nightly.  She reports that she consumes alcohol in  moderation.  She reports compliance with her current medication regimen.   She denies any side effects with her current medication regimen.       VITAL SIGNS:    Blood  pressure 112/82, temperature 97.3, heart rate 88, respiratory rate  16, oxygen saturation 99% on room air, height of 5 feet 0.98 inches, weight  of 66.7 kilograms     REVIEW OF SYSTEMS:     MUSCULOSKELETAL:  No EPS or NMS or tardive dyskinesias or tics or tremors  noticed or reported.     LABORATORY DATA:  No new laboratory data available.       NURSING REPORT:  States that patient continues to perseverate about her  discharge and patient continues to persist that she wants to be discharged  home.  She continues to have poor insight.  She became very angry and  frustrated about not having a choice to return back home and needing to go  to an assisted-living facility.  The patient has difficulty with sleep, but  patient, however, declined a repeat dose of trazodone.  The patient  continues to exhibit poor insight into her memory loss and expressed anger  towards her not being included in the meetings with her son and slept 6  hours interrupted.       SIDE EFFECTS:  The patient denies any side effects to her current psychotropic medication  regimen.     CURRENT MEDICATIONS:  The patient takes  Abilify 2.5 mg twice a day, Risaquad  1 capsule daily, Keppra  500 mg every 12 hours, multivitamin 1 tablet daily, thiamine 100 mg daily.     MENTAL STATUS EXAMINATION:  The patient looks stated age, dressed in casual clothes,, cooperative.  Has fair grooming, fair  hygiene and fair eye contact and is moderately overweight.  No psychomotor  agitation or retardation noticed or reported.  She has a normal gait and  station.  She demonstrated no rigidity or flaccidity or akathisia or  choreoathetoid movements or tics or tremors.  She has a normal rate, tone,  volume, rhythm, and latency of speech.  Mood "is  okay."  Affect appears  to be constricted.  Thought content is negative for any suicidal or  homicidal ideations.  Negative for anxiety.  Negative for PTSD or OCD  symptoms.  Negative for hypomania.  Negative for auditory or  visual  hallucinations or paranoid delusions.  Thought process is linear, logical,  and goal-directed.  Associations are intact.  She has limited insight and  limited judgment into her illness.  She is alert and oriented to date,  month, and year, but not date, oriented to person, place, and situation.   She has ongoing short-term memory loss.  She has poor attention, focus, and  concentration.  Speech is perseverative.  Language is fluent.  Fund of  knowledge appears adequate.  Intelligence is estimated to be average.     DIAGNOSES:  Cognitive disorder, not otherwise specified; rule out alcohol-induced  dementia; depressive disorder, not otherwise specified.     ASSESSMENT AND PLAN:  The patient is recommended to continue being admitted at [facility] on a  voluntary basis for acute stabilization of her mood and psychosis.  The  patient will be monitored for safety and treatment course every 15 minutes  by the nursing staff.  The patient will be engaged in supportive  therapeutic milieu.  She will attend both individual and group activities  as tolerated.  After discussing with the patient about the risks and  benefits and alternative treatments of medications, the patient is  recommended to continue her current medications at current doses for now.   The patient seems to be having lack of insight into her living situation  and the complications of her living situation living alone.  The patient  has ophthalmological records, the patient has chronic alcoholism which has  resulted in patient having multiple falls and having trauma to her head.   Internal medicine will follow up with the patient to address any acute and  chronic medical issues.  We will continue all other home medications as  currently prescribed.  We will attempt to obtain collateral information  from the patient's outpatient treatment providers, family, and friends.   Ativan, Haldol, and Benadryl will be used only on an as-needed basis for  severe  agitation and severe anxiety or psychosis only.  The patient will  meet with the social worker and case management team in order to provide a  safe discharge plan.  The patient has been awaiting to be transferred to an  assisted-living facility.     Expected length of stay is 3 to 5 days.     Total floor time spent with this patient today is 25 minutes.  More than  50% of the time is spent with the patient, discussing about the symptoms,  medications, side effects of medications, performing mental status  examination, and counseling the patient about importance of compliance  with  medication and treatment recommendations and also staying abstinent from  alcohol use and discussing with the patient about the importance of living  in a safe environment to avoid to prevent right risk of multiple falls  which could result in traumatic brain injury.  The patient is recommended  to maintain adequate nutrition and hydration and maintain sleep hygiene.   The patient is recommended to take trazodone 50 mg 1 to 2 tablets nightly  for her insomnia.     SIDE EFFECTS:  The patient denies any side effects to her current psychotropic medication  regimen.

## 2014-07-20 NOTE — Progress Notes (Signed)
July 17, 2014     SUBJECTIVE AND OBJECTIVE:  According to night shift the patient was pleasant and cooperative.  She  continued to talk about his sisters having just died.  She demonstrated  poor memory and periods of confusion.  However, she was able to make her  needs known to the staff.  She did ask for a printout of her medications to  give to her new caregivers.  Her nurse explained that this would be given  to her on discharge.  She returned to bed and slept the rest of the shift.   She presented as she has been having presenting lately, fairly well  groomed, pleasant.  She was alert and oriented to date, place, and  situation.  Her only problem was the marked impaired memory.  Confusion  noted was related to memory issues, specifically.  She explained that she  wants to go home and have her caregiver come every day or 2 to help her in  her home.  She explained and not understanding why this could not happen.   She said that she has a right to go home and people could not make those  choices for her.  I explained that she has severe memory problems due to  her previous alcohol use.  She explained that it never interfered with her  ability to work and that she chose to stop working when she stopped  working.  She said that she has been functioning about the same for the  past several years.  She has not seen much difference so she does not  understand why she needs to have ongoing supervision now.  I explained to  her that I could not answer that, but that at the present time, the family  memory is so poor that she is in danger of doing something unsafe and  because of that, legally we cannot discharge her to any situation where she  does not constant care.  She remains pleasant with me, but irritable.     PSYCHIATRIC SPECIALTY EXAMINATION:  VITAL SIGNS:  See RN assessment, reviewed by me.  MUSCULOSKELETAL:  He demonstrated full range of motion with no  choreoathetoid movements, akathisia, or tics.     MENTAL  STATUS EXAMINATION:  She presented as a well-developed, well-nourished woman who appeared to be  her stated age.  She was well groomed and demonstrated fair hygiene.  She  was alert, cooperative, made intermittent eye contact and maintained  interest in the interview.  She demonstrated no psychomotor agitation,  retardation, or movement problems.  Her speech was goal directed and not  pressured.  Her affect was pleasant although slightly irritable around the  issue of not being able to go home and not being given control over her  life.  She was appropriate in the degree and upset however.  Insight poor.   Motivation and judgment fair.  Use of language, fund of knowledge fair to  poor.     ASSESSMENT:  Unspecified cognitive disorder, history of unspecified alcohol-related  disorder with recent delirium, showing significant improvement in  functioning, all except for memory.     PLAN:  We will continue medications as ordered and continue awaiting placement.     Estimated floor time 25 minutes of which at least half was spent in  coordination of care and counseling including talking with staff, reviewing records.

## 2014-07-21 MED ORDER — TRAZODONE HCL 100 MG PO TABS
100.0000 mg | ORAL_TABLET | Freq: Every evening | ORAL | Status: DC | PRN
Start: 2014-07-21 — End: 2014-08-25

## 2014-07-21 MED ORDER — MUPIROCIN 2 % EX OINT
TOPICAL_OINTMENT | Freq: Three times a day (TID) | CUTANEOUS | Status: DC
Start: 2014-07-21 — End: 2014-08-25

## 2014-07-21 MED ORDER — BENZTROPINE MESYLATE 0.5 MG PO TABS
0.5000 mg | ORAL_TABLET | Freq: Four times a day (QID) | ORAL | Status: AC | PRN
Start: 2014-07-21 — End: ?

## 2014-07-21 MED ORDER — LEVETIRACETAM 500 MG PO TABS
500.0000 mg | ORAL_TABLET | Freq: Two times a day (BID) | ORAL | Status: AC
Start: 2014-07-21 — End: ?

## 2014-07-21 MED ORDER — LORAZEPAM 0.5 MG PO TABS
0.5000 mg | ORAL_TABLET | Freq: Three times a day (TID) | ORAL | Status: AC | PRN
Start: 2014-07-21 — End: ?

## 2014-07-21 MED ORDER — ARIPIPRAZOLE 5 MG PO TABS
2.5000 mg | ORAL_TABLET | Freq: Two times a day (BID) | ORAL | Status: AC
Start: 2014-07-21 — End: ?

## 2014-07-21 NOTE — Progress Notes (Signed)
Discharged at this time via PTS to [facility]

## 2014-07-21 NOTE — Progress Notes (Signed)
CM spoke with Pt's son about transport to [place].  He is unable to take Pt to [facility].  Transport is being arranged for today.

## 2014-07-21 NOTE — Discharge Summary (Signed)
Tentative dicsharge 07/21/2014.  Transfer to [facility]    Virginia Reid  [address]  [place], [place] [address]  [phone] fax      IF YOU ARE A SMOKER AND WOULD LIKE ASSISTANCE TO STOP SMOKING PLEASE SEE INFORMATION BELOW:    SMOKING CESSATION SUPPORT GROUP    Smoking/Tobacco Cessation support Group  [facility]  [address]  [place], [place] [address]  [phone]  5:30am-10pm  Call for group schedule        http://www.cancer.org/healthy/toolsandcalculators/calculators/app/cigarette-calculator  http://www.parker.com/  If you are interested in cessation of tobacco and nicotine you may call    Elisha Headland with [employer] [facility] at [phone].    State (all 50 states) Toll Free Number of [phone]- QUIT NOW)   [employer] [phone]

## 2014-07-21 NOTE — Plan of Care (Signed)
Problem: Psychosocial and Spiritual Needs  Goal: Demonstrates ability to cope with hospitalization/illness  Outcome: Progressing  Patient remains unchanged and aware she is leaving to go to facility in [place]. Awaiting PTS to pick her up. PRN Ativan given at this time.

## 2014-07-21 NOTE — Plan of Care (Signed)
Problem: Loss of functioning (Thought Disorder, Mood Disturbance and/or Severe Anxiety) AS EVIDENCED BY...  Goal: Patient's recovery goal in his/her own words:  Intervention: Assist to identify goals for treatment based on individual needs and strengths.  Pt is for possible discharge today. Pt remained alert but forgetful. Pt is pleasant and cooperative. During brief 1:1, She said that she wrote letter to director of this unit to address the issue of water contamination preventing them for taking shower, states she can't wait to go home. Pt was told that the water issue has be fixed. Pt took HS meds, denies si/hi/avh. Slept well 8hrs. Will cont to monitor.

## 2014-07-21 NOTE — Progress Notes (Signed)
CM spoke with Pt about [facility].  Pt asked if she could go home for 1 night but CM explained that it would not be safe for her to do so.  Pt was upset but then she calmed and was able to talk about her experience here in the hospital as positive.  Pt is being discharged to [facility] today.

## 2014-07-22 NOTE — Discharge Summary (Signed)
Discharge Date: 07/21/2014     ATTENDING PHYSICIAN:  Jeanella Cara, MD     EVENTS LEADING TO HOSPITALIZATION:  The patient is a 64 year old widowed Caucasian female who had been admitted  to the [facility] on February 16 for inpatient treatment of alcohol  withdrawal and DTs.  The patient was stabilized in that setting, assessed  by a psychiatric consultant, and sent via police to the [facility] unit for  further stabilization and placement.  The patient came with a history of  severe alcohol abuse, bipolar disorder, and multiple medical problems.   Please refer to the admission note dictated by me on July 01, 2014 for  additional details.     DIAGNOSES:  AXIS I:  Unspecified neurocognitive disorder, history of unspecified  bipolar disorder, history of unspecified alcohol-related disorder.  AXIS II:  Deferred.  AXIS III:  See history of present illness.  AXIS IV:  Problems with chronic psychiatric illness, altered mental status,  history of alcohol abuse, limited social support, markedly impaired memory.  AXIS V:  Global assessment of functioning on admission 15, global  assessment of functioning on discharge 25.     LABORATORY AND DIAGNOSTIC DATA:  There were no lab results for the 24 hours prior to discharge.  EKG and  medical consults were obtained at the time of admission and throughout the  hospital stay,  according to unit protocol and patient need.     PSYCHIATRIC SPECIALTY EXAMINATION:  VITAL SIGNS:  See RN assessment reviewed by me.  MUSCULOSKELETAL:  She demonstrated full range of motion with no  choreoathetoid movements, akathisia or tics.     MENTAL STATUS EXAMINATION:  She presented as a well-developed, well-nourished, overweight Caucasian  female who appeared to be her stated age.  She was fairly well groomed and  demonstrated fair hygiene.  She was alert, cooperative, made intermittent  eye contact and maintained interest in the interview.  She demonstrated no  psychomotor agitation,  mild  retardation, no movement problems.  Her speech  was goal directed, nonpressured with proper use of syntax.  Many of the  facts she discussed were erroneous due to patient's marked short-term  memory deficits.  Her affect was pleasant to slightly irritable,  appropriate.  She denied and demonstrated no evidence of suicidal,  homicidal, psychotic ideation.  She was alert and oriented.  Insight,  motivation and judgment, use of language, fund of knowledge, impaired by  severe memory loss, otherwise difficult to assess.     HOSPITAL COURSE:  The patient was admitted to the [facility] unit for stabilization of  cognitive function and safety.  During the course of admission, the patient  was cooperative.  She adjusted well to the unit routine and made use of the  supportive environment.  We worked with the patient to help her increase  her social support and to find appropriate discharge placement.  Due to the  patient's memory problems, she continued to not understand how she could  not stay home unsupervised.  Daily review of this issue was not helpful in  the patient understanding her situation.  While her memory remained  markedly impaired, her overall presentation did improve.  Her affect became  more spontaneous.  She became articulate.  She no longer appeared confused,  except by factors associated with memory loss.  She was willing to allow  her son to be her power of attorney, and she did sign forms required for  transfer to the assisted living facility  in [place], although she  continued to complain about wanting to go home and having the right to make  choices about her own living situation.  The patient tolerated well the  medications started and denied side effects from the treatment course.   With the above interventions, her condition did improve, as noted above,  but her memory did not improve.  At the time of discharge, she continued to  have serious memory problems requiring her being in a facility  with ongoing  supervision.  She was transferred by ambulance from the [facility] unit to the  assisted living facility in [place].     DISCHARGE MEDICATIONS:    She was to start the following medication:  Abilify 2.5 mg 2 times daily  for disorganization, mood stabilization (this was associated with  improvement in all functions but her memory); Cogentin 0.5 mg every 6 hours  as needed for EPS; Ativan 0.5 mg three times daily as needed for anxiety;  trazodone 100 mg nightly as needed for sleep and trazodone 50 mg nightly as  needed for sleep.       She is to continue the following medications:  Keppra 500 mg every 12 hours  for seizure disorder, Bactroban 2% ointment applied topically every 8 hours  for a laceration.      SUICIDE STATUS ON DISCHARGE:  The patient was not suicidal.     DISPOSITION AFTERCARE:  The patient was discharged by ambulance to nursing facility in [place].   Discharge instructions were provided by nursing staff because of the  patient's memory problems, all these materials were given to the assisted  living facility.     DISCHARGE PLAN:  There is no plan other than to transfer the patient to the facility in  [place] where she would have ongoing supervision with family for the  necessary followup appointments.     ATTESTATION:  The patient was discharged taking 1 antipsychotic medication, Abilify, to  manage disorganization.     Estimated floor time:  35 minutes of which at least half was spent in the  coordination of care and counseling, which included talking to the patient,  reviewing records, talking with staff, and preparing discharge paperwork.           D:  07/21/2014 23:24 PM by Dr. Harriett [place] ([id number])  T:  07/22/2014 14:44 PM by       Everlean Cherry: [id number]) (Doc ID: [id number])

## 2014-07-23 NOTE — Discharge Instructions (Signed)
Tentative dicsharge 07/21/2014.  Transfer to [facility]    Virginia Reid  [address]  [place], [place] [address]  [phone] fax    **Case Manager has faxed to outpatient provider a copy of discharge summary and AVS, to include Diagnosis, Indication for Medications and Continuing Care Plan on 07/23/2014.        IF YOU ARE A SMOKER AND WOULD LIKE ASSISTANCE TO STOP SMOKING PLEASE SEE INFORMATION BELOW:    SMOKING CESSATION SUPPORT GROUP    Smoking/Tobacco Cessation support Group  [facility]  [address]  [place], [place] [address]  [phone]  5:30am-10pm  Call for group schedule        http://www.cancer.org/healthy/toolsandcalculators/calculators/app/cigarette-calculator  http://www.parker.com/  If you are interested in cessation of tobacco and nicotine you may call    Elisha Headland with [employer] [facility] at [phone].    State (all 50 states) Toll Free Number of [phone]- QUIT NOW)   [employer] [phone]

## 2014-08-25 ENCOUNTER — Emergency Department
Admission: EM | Admit: 2014-08-25 | Discharge: 2014-08-25 | Disposition: A | Discharge status: Home / Self Care | Payer: 59 | Attending: Emergency Medicine | Admitting: Emergency Medicine

## 2014-08-25 ENCOUNTER — Emergency Department: Payer: 59

## 2014-08-25 DIAGNOSIS — R109 Unspecified abdominal pain: Secondary | ICD-10-CM | POA: Insufficient documentation

## 2014-08-25 DIAGNOSIS — K439 Ventral hernia without obstruction or gangrene: Secondary | ICD-10-CM | POA: Insufficient documentation

## 2014-08-25 HISTORY — DX: Bipolar disorder, unspecified: F31.9

## 2014-08-25 HISTORY — DX: Alcohol dependence, uncomplicated: F10.20

## 2014-08-25 HISTORY — DX: Unspecified convulsions: R56.9

## 2014-08-25 HISTORY — DX: Other symptoms and signs involving cognitive functions and awareness: R41.89

## 2014-08-25 LAB — CBC AND DIFFERENTIAL
Basophils %: 1.4 % (ref 0.0–3.0)
Basophils Absolute: 0.1 10*3/uL (ref 0.0–0.3)
Eosinophils %: 6.1 % (ref 0.0–7.0)
Eosinophils Absolute: 0.3 10*3/uL (ref 0.0–0.8)
Hematocrit: 42.2 % (ref 36.0–48.0)
Hemoglobin: 14.2 gm/dL (ref 12.0–16.0)
Lymphocytes Absolute: 1.6 10*3/uL (ref 0.6–5.1)
Lymphocytes: 28.2 % (ref 15.0–46.0)
MCH: 33 pg (ref 28–35)
MCHC: 34 gm/dL (ref 32–36)
MCV: 98 fL (ref 80–100)
MPV: 7.2 fL (ref 6.0–10.0)
Monocytes Absolute: 0.4 10*3/uL (ref 0.1–1.7)
Monocytes: 7.2 % (ref 3.0–15.0)
Neutrophils %: 57.2 % (ref 42.0–78.0)
Neutrophils Absolute: 3.2 10*3/uL (ref 1.7–8.6)
PLT CT: 269 10*3/uL (ref 130–440)
RBC: 4.3 10*6/uL (ref 3.80–5.00)
RDW: 11 % (ref 11.0–14.0)
WBC: 5.7 10*3/uL (ref 4.0–11.0)

## 2014-08-25 LAB — COMPREHENSIVE METABOLIC PANEL
ALT: 15 U/L (ref 0–55)
AST (SGOT): 21 U/L (ref 10–42)
Albumin/Globulin Ratio: 1.22 Ratio (ref 0.70–1.50)
Albumin: 4.4 gm/dL (ref 3.5–5.0)
Alkaline Phosphatase: 63 U/L (ref 40–145)
Anion Gap: 15.8 mMol/L (ref 7.0–18.0)
BUN / Creatinine Ratio: 11.8 Ratio (ref 10.0–30.0)
BUN: 10 mg/dL (ref 7–22)
Bilirubin, Total: 0.3 mg/dL (ref 0.1–1.2)
CO2: 23.2 mMol/L (ref 20.0–30.0)
Calcium: 10.1 mg/dL (ref 8.5–10.5)
Chloride: 104 mMol/L (ref 98–110)
Creatinine: 0.85 mg/dL (ref 0.60–1.20)
EGFR: >60 mL/min/{1.73_m2}
Globulin: 3.6 gm/dL (ref 2.0–4.0)
Glucose: 77 mg/dL (ref 70–99)
Osmolality Calc: 275 mOsm/kg (ref 275–300)
Potassium: 4 mMol/L (ref 3.5–5.3)
Protein, Total: 8 gm/dL (ref 6.0–8.3)
Sodium: 139 mMol/L (ref 136–147)

## 2014-08-25 LAB — VH URINALYSIS WITH MICROSCOPIC AND CULTURE IF INDICATED
Bilirubin, UA: NEGATIVE
Blood, UA: NEGATIVE
Glucose, UA: NEGATIVE mg/dL
Ketones UA: NEGATIVE mg/dL
Leukocyte Esterase, UA: 25 Leu/uL — AB
Nitrite, UA: NEGATIVE
Protein, UR: NEGATIVE mg/dL
RBC, UA: 1 /hpf (ref 0–5)
Squam Epithel, UA: <1 /hpf (ref 0–2)
Urine Specific Gravity: 1.003 (ref 1.001–1.040)
Urobilinogen, UA: NORMAL mg/dL
WBC, UA: 1 /hpf (ref 0–4)
pH, Urine: 7 pH (ref 5.0–8.0)

## 2014-08-25 LAB — LIPASE: Lipase: 29 U/L (ref 8–78)

## 2014-08-25 LAB — ETHANOL: Alcohol: <10 mg/dL (ref 0–9)

## 2014-08-25 MED ORDER — IOHEXOL 350 MG/ML IV SOLN
100.0000 mL | Freq: Once | INTRAVENOUS | Status: AC | PRN
Start: 2014-08-25 — End: 2014-08-25
  Administered 2014-08-25: 100 mL via INTRAVENOUS

## 2014-08-25 NOTE — ED Notes (Signed)
[facility] contacted for transport.  VMT states that the transport will be here in about 30 min.

## 2014-08-25 NOTE — ED Notes (Signed)
Bed: C12-A  Expected date:   Expected time:   Means of arrival:   Comments:  VMT

## 2014-08-25 NOTE — ED Provider Notes (Signed)
[facility] EMERGENCY DEPARTMENT   History and Physical Exam      Patient Name: Virginia Reid, Virginia Reid  Encounter Date:  08/25/2014  Attending Physician: Marsa Aris, MD  PCP: Anise Salvo, MD  Patient DOB:  1951-04-21  MRN:  [id number]  Room:  C12/C12-A      History of Presenting Illness     Chief complaint: Hernia    HPI/ROS is limited by: dementia  HPI/ROS given by: patient    Location: right abdominal wall  Duration: over a year  Severity: moderate          Nursing Notes reviewed and acknowledged.    Virginia Reid is a 64 y.o. female who presents with hernia. Pt reports intermittent pain described as soreness in her right abdomen. She was sent from an assisted living facility after mentioning to a med-tech that her stomach was "still swollen." The pain is intermittent and minimal. Denies n/v/d, problems with eating and drinking. She says she has had a hernia on her right abdomen for over a year but it seems to have gotten slightly larger recently and is a little sore.     Review of prior notes shows a hx of chronic psychiatric disorders and memory impairment.      Review of Systems     Review of Systems   Constitutional: Negative for fever, chills, activity change, appetite change and fatigue.   HENT: Negative for congestion, ear pain, rhinorrhea, sinus pressure, sneezing, sore throat and trouble swallowing.    Eyes: Negative for pain, discharge, itching and visual disturbance.   Respiratory: Negative for cough, chest tightness, shortness of breath and wheezing.    Cardiovascular: Negative for chest pain and leg swelling.   Gastrointestinal: Positive for abdominal pain (soreness) and abdominal distention (ventral hernia). Negative for nausea, vomiting and diarrhea.   Genitourinary: Negative for dysuria, frequency, hematuria, flank pain, decreased urine volume, vaginal discharge, difficulty urinating and vaginal pain.   Musculoskeletal: Negative for myalgias, back pain, arthralgias and neck pain.    Skin: Negative for color change, pallor and rash.   Neurological: Negative for dizziness, weakness, light-headedness, numbness and headaches.   Hematological: Does not bruise/bleed easily.   Psychiatric/Behavioral: Negative for suicidal ideas and behavioral problems.   All other systems reviewed and are negative.       Allergies     Pt is allergic to zoloft.    Medications     Current Outpatient Rx   Name  Route  Sig  Dispense  Refill   . ARIPiprazole (ABILIFY) 5 MG tablet    Oral    Take 0.5 tablets (2.5 mg total) by mouth 2 (two) times daily with meals.    30 tablet    0     . benztropine (COGENTIN) 0.5 MG tablet    Oral    Take 1 tablet (0.5 mg total) by mouth every 6 (six) hours as needed.    30 tablet    0     . folic acid (FOLVITE) 1 MG tablet    Oral    Take 1 mg by mouth daily.             . haloperidol (HALDOL) 5 MG tablet    Oral    Take 5 mg by mouth every 4 (four) hours as needed.             . levETIRAcetam (KEPPRA) 500 MG tablet    Oral    Take 1 tablet (500 mg total) by  mouth every 12 (twelve) hours.    28 tablet    0     . loratadine (CLARITIN) 10 MG tablet    Oral    Take 10 mg by mouth daily.             Marland Kitchen LORazepam (ATIVAN) 0.5 MG tablet    Oral    Take 1 tablet (0.5 mg total) by mouth 3 (three) times daily as needed for Anxiety (Mild agitation/anxiety).    30 tablet    0     . LORazepam (ATIVAN) 0.5 MG tablet    Oral    Take 0.5 mg by mouth every 4 (four) hours as needed for Anxiety.             . Multiple Vitamin (MULTIVITAMIN) tablet    Oral    Take 1 tablet by mouth daily.             . Probiotic Product (RISAQUAD PO)    Oral    Take 230 mg by mouth daily.             . vitamin Reid-1 (THIAMINE) 100 MG tablet    Oral    Take 100 mg by mouth every other day.             Marland Kitchen DISCONTD: mupirocin (BACTROBAN) 2 % ointment    Topical    Apply topically every 8 (eight) hours.    15 g    0     . DISCONTD: traZODone (DESYREL) 100 MG tablet    Oral    Take 1 tablet (100 mg total) by mouth nightly as needed  for Sleep (May repeat X1  if still awake after 1 hour.).    14 tablet    0     . DISCONTD: traZODone (DESYREL) 50 MG tablet    Oral    Take 1 tablet (50 mg total) by mouth nightly as needed for Sleep.    14 tablet    0          Past Medical History     Pt has a past medical history of Alcoholism; Convulsions; Bipolar 1 disorder; and Cognitive impairment.    Past Surgical History     Pt has no past surgical history on file.    Family History     The family history is not on file.    Social History     Pt reports that she has never smoked. She does not have any smokeless tobacco history on file. She reports that she drinks alcohol. She reports that she does not use illicit drugs.    Physical Exam     Blood pressure 139/96, pulse 85, temperature 99 F (37.2 C), resp. rate 18, height 1.549 m, weight 66 kg, SpO2 98 %.    Constitutional: Vital signs reviewed. Well appearing. Well nourished. No acute distress  Head: Normocephalic, atraumatic  Eyes: PERRL. EOMI. Conjunctiva and sclera are normal.  No injection or discharge.  Ears, Nose, Throat:  Airway Patent. Normal external examination of the nose and ears. TMs WNL bilaterally. No tonsillar edema, erythema or exudates. Uvula midline.  Neck: Supple. Trachea midline. Normal range of motion. Non-tender.   Respiratory/Chest: No respiratory distress. Breath sounds clear to auscultation bilaterally. No wheezes, rales or rhonchi.  Cardiac: Regular rate and rhythm. No murmurs, rub or gallop.   Abdomen: TENDER MASS IN EPIGASTRUM. ABDOMINAL SWELLING IN RLQ TENDER TO PALPATION. Soft. Bowel sounds normal active.   Back:  No deformity, No CVAT, No Spinal or Paraspinous tenderness to palpation.  Extremities: No cyanosis. No edema. No evidence of injury. Positive pulses with good capillary refill bilaterally and symmetrically.  Neurological: Alert and conversant.  Normal mental status. Visual fields intact. Face symmetric.  Speech fluid.  No drift.  Strength 5/5 UE and LE bilaterally.   Normal finger-nose-finger.  2+LE DTRs.  Normal FNF.  Skin: Warm, dry and intact. No rash.  Psychiatric: Alert and conversant.  Normal affect.  Normal insight.    Orders Placed     Orders Placed This Encounter   Procedures   . Urine Culture   . CT Abdomen Pelvis with IV Cont   . CBC   . CMP   . Lipase   . Urinalysis w Microscopic and Culture if Indicated   . Alcohol Level   . Saline lock IV       Diagnostic Results       The results of the diagnostic studies below have been reviewed by myself:    Labs  Results     Procedure Component Value Units Date/Time    Urine Culture [[id number]] Collected:  08/25/14 1530    Specimen Information:  Urine, Random Updated:  08/25/14 1647    Narrative:      Specimen: Urine, Random  Collected: 08/25/2014 15:30     Status: Valued      Last Updated: 08/25/2014 16:47                Culture Result (Prelim)      Culture In Progress          CMP [[id number]] Collected:  08/25/14 1603    Specimen Information:  Plasma Updated:  08/25/14 1629     Sodium 139 mMol/L      Potassium 4.0 mMol/L      Chloride 104 mMol/L      CO2 23.2 mMol/L      CALCIUM 10.1 mg/dL      Glucose 77 mg/dL      Creatinine 7.82 mg/dL      BUN 10 mg/dL      Protein, Total 8.0 gm/dL      Albumin 4.4 gm/dL      Alkaline Phosphatase 63 U/L      ALT 15 U/L      AST (SGOT) 21 U/L      Bilirubin, Total 0.3 mg/dL      Albumin/Globulin Ratio 1.22 Ratio      Anion Gap 15.8 mMol/L      BUN/Creatinine Ratio 11.8 Ratio      EGFR >60 mL/min/1.48m2      Osmolality Calc 275 mOsm/kg      Globulin 3.6 gm/dL     Lipase [[id number]] Collected:  08/25/14 1603    Specimen Information:  Plasma Updated:  08/25/14 1629     Lipase 29 U/L     Alcohol Level [[id number]] Collected:  08/25/14 1603    Specimen Information:  Plasma Updated:  08/25/14 1629     Alcohol <10 mg/dL     CBC [[id number]] Collected:  08/25/14 1603    Specimen Information:  Blood / Blood Updated:  08/25/14 1612     WBC 5.7 K/cmm      RBC 4.30 M/cmm      Hemoglobin 14.2 gm/dL       Hematocrit 01.0 %      MCV 98 fL      MCH 33 pg      MCHC 34 gm/dL  RDW 11.0 %      PLT CT 269 K/cmm      MPV 7.2 fL      NEUTROPHIL % 57.2 %      Lymphocytes 28.2 %      Monocytes 7.2 %      Eosinophils % 6.1 %      Basophils % 1.4 %      Neutrophils Absolute 3.2 K/cmm      Lymphocytes Absolute 1.6 K/cmm      Monocytes Absolute 0.4 K/cmm      Eosinophils Absolute 0.3 K/cmm      BASO Absolute 0.1 K/cmm     Urinalysis w Microscopic and Culture if Indicated [[id number]]  (Abnormal) Collected:  08/25/14 1530    Specimen Information:  Urine, Random Updated:  08/25/14 1548     Color, UA Straw      Clarity, UA Clear      Specific Gravity, UR 1.003      pH, Urine 7.0 pH      Protein, UR Negative mg/dL      Glucose, UA Negative mg/dL      Ketones UA Negative mg/dL      Bilirubin, UA Negative      Blood, UA Negative      Nitrite, UA Negative      Urobilinogen, UA Normal mg/dL      Leukocyte Esterase, UA 25 (A) Leu/uL      UR Micro Performed      WBC, UA 1 /hpf      RBC, UA 1 /hpf      Squam Epithel, UA <1 /hpf     Narrative:      A Urine Culture has been ordered based upon the Positive UA results.          Radiologic Studies  Radiology Results (24 Hour)     Procedure Component Value Units Date/Time    CT Abdomen Pelvis with IV Cont [[id number]] Collected:  08/25/14 1655    Order Status:  Completed Updated:  08/25/14 1701    Narrative:      Clinical History:  Abdominal wall hernia    Technique:  CT of abdomen and pelvis with intravenous contrast obtained. Multiplanar reconstructions performed.    Contrast:  100 cc Omnipaque 350    Comparison:  None available.    Findings:    Lower chest:  The lung bases are clear. No pleural effusions.    Abdomen:  Liver normal size, contour and density. No mass or biliary dilatation.     Gallbladder surgically absent. Pancreas, Spleen and Adrenal glands normal.    Kidneys normal size and contour. No hydronephrosis, mass or stones.    The stomach, duodenum and small bowel are normal in  caliber without inflammatory changes.  Colon normal caliber and wall thickness without inflammation or mass.    No mass, pathologic adenopathy or free fluid. The aorta and IVC are normal caliber.    Pelvis:  Uterus and ovaries unremarkable. Bladder normal. No free fluid. No mass or adenopathy.    Bones and Soft Tissues:  There is a small ventral hernia at the upper abdominal wall at the midline with peritoneal fat extending through the  defect. The defect is 2.5 cm wide. No bowel herniation.      Impression:      Small ventral hernia upper abdomen slightly to the right of midline containing only fat.        ReadingStation:[id number]  EKG: none        Medications Given In ED       ED Medication Orders     None              MDM and Assessment   She was advised on her hernia and need for outpatient Surgical consult for further evaluation and management. Patient will follow up with their PCP. If symptoms worsen, they will return to the ED. Patient is in agreement with this plan.              Procedures             Diagnosis / Disposition     Clinical Impression  1. Abdominal wall hernia        Disposition  ED Disposition     Discharge Virginia Reid discharge to home/self care.    Condition at disposition: Stable            Prescriptions  New Prescriptions    No medications on file       Scribe Attestation statement    I, Marsa Aris, personally performed the services documented. Virginia Reid is scribing for me on Virginia Reid,Virginia Reid. I reviewed and confirm the accuracy of the information in this medical record.           Virginia Hanks, MD                    Marsa Aris, MD  08/25/14 [phone]

## 2014-08-25 NOTE — ED Notes (Signed)
[facility] consulted and report given to nursing staff.

## 2014-08-25 NOTE — Discharge Instructions (Signed)
Hernia [Adult]    A hernia is a bulge of the intestines or surrounding tissues through a tear in the muscle of the abdomen or groin. This may occur as a result of excessive coughing, heavy lifting or being overweight. It can also occur at the site of prior surgery. When a hernia first appears it may be painful due to stretching and tearing of the muscle fibers. When you lie down, the bulge should reduce in size or disappear completely. If it does not, and you are unable to flatten it with your hand, medical attention is needed at once.  Home Care:  Avoid heavy lifting and straining or any activities that cause pain in the hernia.  Follow Up  with your physician as directed by our staff.  Get Prompt Medical Attention  if any of the following occur:   Increasing size of the hernia   Increasing pain in the hernia   A hernia that does not get smaller when you lie down   Hardening of the hernia   Abdominal swelling, fever or repeated vomiting   Pain moves to the lower right abdomen (just below the waistline) or spreads to the back   2000-2015 [employer] [address], [place] [address]. All rights reserved. This information is not intended as a substitute for professional medical care. Always follow your healthcare professional's instructions.

## 2014-09-10 ENCOUNTER — Emergency Department: Payer: 59

## 2014-09-10 ENCOUNTER — Emergency Department
Admission: EM | Admit: 2014-09-10 | Discharge: 2014-09-10 | Disposition: A | Discharge status: Skilled Nursing Facility | Payer: 59 | Attending: Emergency Medicine | Admitting: Emergency Medicine

## 2014-09-10 ENCOUNTER — Telehealth (EMERGENCY_DEPARTMENT_HOSPITAL): Payer: 59

## 2014-09-10 DIAGNOSIS — R569 Unspecified convulsions: Secondary | ICD-10-CM | POA: Insufficient documentation

## 2014-09-10 DIAGNOSIS — F1027 Alcohol dependence with alcohol-induced persisting dementia: Secondary | ICD-10-CM

## 2014-09-10 DIAGNOSIS — F1097 Alcohol use, unspecified with alcohol-induced persisting dementia: Secondary | ICD-10-CM

## 2014-09-10 DIAGNOSIS — F319 Bipolar disorder, unspecified: Secondary | ICD-10-CM | POA: Insufficient documentation

## 2014-09-10 DIAGNOSIS — F0391 Unspecified dementia with behavioral disturbance: Secondary | ICD-10-CM

## 2014-09-10 DIAGNOSIS — R451 Restlessness and agitation: Secondary | ICD-10-CM

## 2014-09-10 LAB — CBC AND DIFFERENTIAL
Basophils Absolute Automated: 0.04 10*3/uL (ref 0.00–0.20)
Basophils Automated: 0 %
Eosinophils Absolute Automated: 0.21 10*3/uL (ref 0.00–0.70)
Eosinophils Automated: 3 %
Hematocrit: 42.6 % (ref 37.0–47.0)
Hgb: 14.6 g/dL (ref 12.0–16.0)
Immature Granulocytes Absolute: 0.01 10*3/uL
Immature Granulocytes: 0 %
Lymphocytes Absolute Automated: 1.51 10*3/uL (ref 0.50–4.40)
Lymphocytes Automated: 18 %
MCH: 32.2 pg — ABNORMAL HIGH (ref 28.0–32.0)
MCHC: 34.3 g/dL (ref 32.0–36.0)
MCV: 93.8 fL (ref 80.0–100.0)
MPV: 10.4 fL (ref 9.4–12.3)
Monocytes Absolute Automated: 0.51 10*3/uL (ref 0.00–1.20)
Monocytes: 6 %
Neutrophils Absolute: 5.9 10*3/uL (ref 1.80–8.10)
Neutrophils: 72 %
Platelets: 305 10*3/uL (ref 140–400)
RBC: 4.54 10*6/uL (ref 4.20–5.40)
RDW: 12 % (ref 12–15)
WBC: 8.17 10*3/uL (ref 3.50–10.80)

## 2014-09-10 LAB — SALICYLATE LEVEL: Salicylate Level: <5 mg/dL — ABNORMAL LOW (ref 15.0–30.0)

## 2014-09-10 LAB — TSH: TSH: 2.58 u[IU]/mL (ref 0.35–4.94)

## 2014-09-10 LAB — RAPID DRUG SCREEN, URINE
Barbiturate Screen, UR: NEGATIVE
Benzodiazepine Screen, UR: NEGATIVE
Cannabinoid Screen, UR: NEGATIVE
Cocaine, UR: NEGATIVE
Opiate Screen, UR: NEGATIVE
PCP Screen, UR: NEGATIVE
Urine Amphetamine Screen: NEGATIVE

## 2014-09-10 LAB — COMPREHENSIVE METABOLIC PANEL
ALT: 17 U/L (ref 0–55)
AST (SGOT): 20 U/L (ref 5–34)
Albumin/Globulin Ratio: 1.4 (ref 0.9–2.2)
Albumin: 4.6 g/dL (ref 3.5–5.0)
Alkaline Phosphatase: 64 U/L (ref 37–106)
Anion Gap: 12 (ref 5.0–15.0)
BUN: 7.6 mg/dL (ref 7.0–19.0)
Bilirubin, Total: 0.4 mg/dL (ref 0.2–1.2)
CO2: 22 mEq/L (ref 22–29)
Calcium: 10 mg/dL (ref 8.5–10.5)
Chloride: 103 mEq/L (ref 100–111)
Creatinine: 0.9 mg/dL (ref 0.6–1.0)
Globulin: 3.3 g/dL (ref 2.0–3.6)
Glucose: 88 mg/dL (ref 70–100)
Potassium: 3.8 mEq/L (ref 3.5–5.1)
Protein, Total: 7.9 g/dL (ref 6.0–8.3)
Sodium: 137 mEq/L (ref 136–145)

## 2014-09-10 LAB — ACETAMINOPHEN LEVEL: Acetaminophen Level: <6 ug/mL — ABNORMAL LOW (ref 10–30)

## 2014-09-10 LAB — GFR: EGFR: >60

## 2014-09-10 LAB — ETHANOL: Alcohol: NOT DETECTED mg/dL

## 2014-09-10 MED ORDER — HALOPERIDOL LACTATE 5 MG/ML IJ SOLN
5.0000 mg | Freq: Once | INTRAMUSCULAR | Status: AC
Start: 2014-09-10 — End: 2014-09-10
  Administered 2014-09-10: 5 mg via INTRAMUSCULAR
  Filled 2014-09-10: qty 1

## 2014-09-10 MED ORDER — LORAZEPAM 1 MG PO TABS
1.0000 mg | ORAL_TABLET | Freq: Once | ORAL | Status: AC
Start: 2014-09-10 — End: 2014-09-10
  Administered 2014-09-10: 1 mg via ORAL
  Filled 2014-09-10: qty 1

## 2014-09-10 MED ORDER — DIPHENHYDRAMINE HCL 25 MG PO CAPS
25.0000 mg | ORAL_CAPSULE | Freq: Once | ORAL | Status: AC
Start: 2014-09-10 — End: 2014-09-10
  Administered 2014-09-10: 25 mg via ORAL
  Filled 2014-09-10: qty 1

## 2014-09-10 NOTE — ED Provider Notes (Signed)
Physician/Midlevel provider first contact with patient: 09/10/14 1430         History     Chief Complaint   Patient presents with   . Psychiatric Evaluation     HPI Comments: Pt with hx of ETOH dependence with resultant dementia brought sent to ED by staff at [facility] in [place].  This was so that the patient could be evaluated for agitation when her psychiatrist could not be reached.  She has been 'threatening' other patients at facility.    Pt is inpatient there under court order.  Son is POA.      Patient is a 64 y.o. female presenting with mental health disorder. The history is provided by the patient (SNF staff, son). The history is limited by the condition of the patient. No language interpreter was used.   Mental Health Problem  Presenting symptoms: aggressive behavior, agitation and bizarre behavior    Presenting symptoms: no self mutilation, no suicidal thoughts, no suicidal threats and no suicide attempt    Degree of incapacity (severity):  Severe  Onset quality:  Gradual  Timing:  Constant  Progression:  Worsening  Chronicity:  Chronic  Context: alcohol use and stressful life event    Treatment compliance:  All of the time  Relieved by:  Nothing  Worsened by:  Nothing tried  Ineffective treatments:  None tried  Associated symptoms: no abdominal pain, no appetite change, no decreased need for sleep, no feelings of worthlessness, no headaches and no weight change    Risk factors: recent psychiatric admission             Past Medical History   Diagnosis Date   . Alcoholism    . Convulsions    . Bipolar 1 disorder    . Cognitive impairment        Past Surgical History   Procedure Laterality Date   . Cholecystectomy         No family history on file.    Social  History   Substance Use Topics   . Smoking status: Never Smoker    . Smokeless tobacco: Not on file      Comment: Pt denies tobacco use the past 12 months, counseling given.    . Alcohol Use: Yes      Comment: Pt report alcohol use the past 12  months        .     Allergies   Allergen Reactions   . Zoloft [Sertraline] Anxiety       Home Medications     Last Medication Reconciliation Action:  In Progress Scot Jun, RN 09/10/2014  2:41 PM                  ARIPiprazole (ABILIFY) 5 MG tablet     Take 0.5 tablets (2.5 mg total) by mouth 2 (two) times daily with meals.     benztropine (COGENTIN) 0.5 MG tablet     Take 1 tablet (0.5 mg total) by mouth every 6 (six) hours as needed.     folic acid (FOLVITE) 1 MG tablet     Take 1 mg by mouth daily.     haloperidol (HALDOL) 5 MG tablet     Take 5 mg by mouth every 4 (four) hours as needed.     levETIRAcetam (KEPPRA) 500 MG tablet     Take 1 tablet (500 mg total) by mouth every 12 (twelve) hours.     loratadine (CLARITIN) 10 MG tablet  Take 10 mg by mouth daily.     LORazepam (ATIVAN) 0.5 MG tablet     Take 1 tablet (0.5 mg total) by mouth 3 (three) times daily as needed for Anxiety (Mild agitation/anxiety).     LORazepam (ATIVAN) 0.5 MG tablet     Take 0.5 mg by mouth every 4 (four) hours as needed for Anxiety.     Multiple Vitamin (MULTIVITAMIN) tablet     Take 1 tablet by mouth daily.     Probiotic Product (RISAQUAD PO)     Take 230 mg by mouth daily.     vitamin B-1 (THIAMINE) 100 MG tablet     Take 100 mg by mouth every other day.           Review of Systems   Constitutional: Positive for activity change. Negative for appetite change.   Respiratory: Negative.    Cardiovascular: Negative.    Gastrointestinal: Negative.  Negative for abdominal pain.   Genitourinary: Negative.    Musculoskeletal: Negative.    Skin: Negative.    Neurological: Negative for headaches.   Psychiatric/Behavioral: Positive for behavioral problems, confusion and agitation. Negative for suicidal ideas, sleep disturbance and self-injury.   All other systems reviewed and are negative.      Physical Exam    BP: 150/81 mmHg, Heart Rate: 86, Temp: 97.6 F (36.4 C), Resp Rate: 18, SpO2: 100 %, Weight: 58.968 kg    Physical Exam    Constitutional: She appears well-developed and well-nourished. No distress.   HENT:   Head: Normocephalic and atraumatic.   Right Ear: External ear normal.   Eyes: Pupils are equal, round, and reactive to light.   Neck: Neck supple.   Cardiovascular: Normal rate, regular rhythm, S1 normal, S2 normal, normal heart sounds and intact distal pulses.  Exam reveals no gallop and no friction rub.    No murmur heard.  Pulmonary/Chest: Effort normal and breath sounds normal. No respiratory distress. She has no decreased breath sounds. She has no wheezes. She has no rhonchi. She has no rales. She exhibits no tenderness.   Abdominal: Soft. Bowel sounds are normal. She exhibits no shifting dullness, no distension, no pulsatile liver, no fluid wave, no abdominal bruit, no ascites, no pulsatile midline mass and no mass. There is no tenderness.   Musculoskeletal:        Cervical back: Normal.        Thoracic back: Normal.        Lumbar back: Normal.   Neurological: She is alert. She has normal strength. She is disoriented. She displays no atrophy and no tremor. No cranial nerve deficit or sensory deficit. She exhibits normal muscle tone. She displays no seizure activity. GCS eye subscore is 4. GCS verbal subscore is 5. GCS motor subscore is 6.   Pt unaware of year or current president.  Alert to self and plance and situation.   Skin: Skin is warm. She is not diaphoretic.   Psychiatric: Her speech is normal and behavior is normal. Judgment and thought content normal. Her affect is angry and blunt. Cognition and memory are impaired. She exhibits abnormal recent memory.   Nursing note and vitals reviewed.        MDM and ED Course     ED Medication Orders     Start Ordered     Status Ordering Provider    09/10/14 1934 09/10/14 1933  diphenhydrAMINE (BENADRYL) capsule 25 mg   Once     Route: Oral  Ordered Dose:  25 mg     Last MAR action:  Given Alonnie Bieker EDWIN    09/10/14 1932 09/10/14 1931  haloperidol lactate (HALDOL) injection 5  mg   Once     Route: Intramuscular  Ordered Dose: 5 mg     Last MAR action:  Given Ashari Llewellyn EDWIN    09/10/14 1906 09/10/14 1905  LORazepam (ATIVAN) tablet 1 mg   Once     Route: Oral  Ordered Dose: 1 mg     Last MAR action:  Given Raidon Swanner EDWIN    09/10/14 1713 09/10/14 1712  haloperidol lactate (HALDOL) injection 5 mg   Once     Route: Intramuscular  Ordered Dose: 5 mg     Last MAR action:  Given Denisia Harpole EDWIN    09/10/14 1608 09/10/14 1607  LORazepam (ATIVAN) tablet 1 mg   Once     Route: Oral  Ordered Dose: 1 mg     Last MAR action:  Given Keiona Jenison EDWIN             MDM  Number of Diagnoses or Management Options  Agitation: established and worsening  Diagnosis management comments:   I, Adelene Idler, Physician Assistant, have been the primary provider for this pt during their ED stay.     The attending signature signifies review and agreement of the history, physical examination, evaluation, clinical impression and plan except as noted.     02 sat is 100% on  ra, which is nml.     Pt sent to ED for evaluation of acute on chronic agitation related to dementia.    Pt mildly agitated and would 'like to go home.'    Spoke at length with pt son and staff at [facility].  PT is under court order and son is POA (faxed to ED)  He understands their concern today, and would prefer the patient be returned when possible.    Lab/Rad results reviewed include:     Labs Reviewed  ACETAMINOPHEN LEVEL - Abnormal; Notable for the following:      Acetaminophen Level           <6 (*)              All other components within normal limits  CBC AND DIFFERENTIAL - Abnormal; Notable for the following:      MCH                           32.2 (*)            All other components within normal limits  SALICYLATE LEVEL - Abnormal; Notable for the following:      Salicylate Level              <5.0 (*)            All other components within normal limits  COMPREHENSIVE METABOLIC PANEL  ETHANOL  TSH  RAPID DRUG SCREEN, URINE  GFR    EKG is NSR  at 88 bpm.  No st changes or axis dev.  No previous EKG.  EKG interp my ED MD DR. Girard Cooter.    Pt evaluated both by [employer] and [employer].  No indication for transfer to other facility.    Pt is calm after receiving normal evening medication.  Will be returned to [facility] via PTS.    PT stable at time of transfer.  Amount and/or Complexity of Data Reviewed  Clinical lab tests: reviewed and ordered  Tests in the medicine section of CPT: reviewed and ordered  Obtain history from someone other than the patient: yes  Review and summarize past medical records: yes  Discuss the patient with other providers: yes    Risk of Complications, Morbidity, and/or Mortality  Presenting problems: high  Diagnostic procedures: high  Management options: high    Patient Progress  Patient progress: stable            Procedures    Clinical Impression & Disposition     Clinical Impression  Final diagnoses:   Agitation        ED Disposition     Discharge Addylin B Yeung discharge to home/self care.    Condition at disposition: Stable    Return to [facility] with PTS             Discharge Medication List as of 09/10/2014  7:50 PM                      Jamse Arn, PA  09/11/14 2125    Jamse Arn, PA  09/11/14 2125    Melvern Sample, DO  09/13/14 1051

## 2014-09-10 NOTE — ED Notes (Signed)
Pt complaining of cough. PA aware.

## 2014-09-10 NOTE — Progress Notes (Signed)
Psychiatric Evaluation Part I    Virginia Reid is a 64 y.o. female admitted to the [facility] Emergency Department who was seen via Telepsych on 09/10/2014 by Ihor Austin Carl Bleecker.    Call Details  Patient Location: [facility]  Patient Room Number: 18  Time contacted by ED Physician: 1605  Time consult began: 1617  Time (in minutes) from Call to Consult: 12  Time consult concluded: 1634  Referring ED Department  Emergency Department: [facility]        Discharge Planning  Living Arrangements: Alone  Support Systems: Children  Type of Residence: Assisted living  Patient expects to be discharged to:: Her home    Presenting Mental Status  Orientation Level: Oriented to time, Oriented to place, Oriented to person, Disoriented to situation  Memory: Recent memory impaired  Thought Content: normal  Thought Process: perseverative  Behavior: normal  Consciousness: Alert  Impulse Control: normal  Perception: normal  Eye Contact: normal  Attitude: cooperative  Mood: normal  Hopelessness Affects Goals: No  Hopelessness About Future: No  Affect: normal  Speech: normal  Concentration: normal  Insight: poor  Judgment: poor  Appearance: normal  Appetite: normal  Weight change?: normal  Energy: normal  Sleep: normal  Reliability of Reporter/Patient: questionable    Tool for Assessment of Suicide Risk  Individual Risk Profile: social isolation, poor social support  Symptom Risk Profile: depressive symptoms  Protective Factors: internalized teachings against suicide  Level of Suicide Risk: Low  Monitoring/Suicide Alert Level: Routine monitoring    Within the Last 6 Months:: history of violence toward self  Greater than 6 Months Ago:: no history of violence toward self  Violence Toward Self: episode(s) of violence towards self  When did violence toward self occur?: Today  Description of Violence Toward Self: Hitting self       Substance Abuse History  Previous Substance Abuse Treatment?: No  Self-Help Group Involvement  Current  Involvement: None  Past Involvement: None  Substance Recovery Support  Have you been prescribed medications to support recovery?: None  Past Withdrawal Symptoms  Past Withdrawal Symptoms: None  History of Blackouts?: No  History of Withdrawal Seizures?: No    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 This morning she apparently became agitated and started threatening people at the facility and began to hit herself. They transported her to the ED. Patient has alcohol-induced dementia and had no recollection of the events leading to her coming to the ED. When informed of the report that was received by the ED she stated that it was not true. She acknowledged that she feels a little depressed because "my twin sister just died". She had no information of this event and states that her depression is manageable.  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 She states that she wants  to go home to her townhouse.  Patient minimizes her alcohol use but says she has not had any alcohol for 3 or 4 weeks. When she was drinking she says she would have a glass of wine or beer with dinner five days a week.  She denies suicidal/homicidal ideation. She denies hallucinations/delusions. She reports that her sleep and appetite are good. She has her son as a support.  Patient is unable to care for self outside of a supervised living situation. She needs to  return to assisted living or be cared for by family.    Disposition:Disposition  Other Disposition Sources: Other (Comment) (Assisted living)    Insurance Pre-authorization information: N/A      [facility]  [address]  [place], [place] [address]  [phone]

## 2014-09-10 NOTE — ED Provider Notes (Signed)
IMelvern Sample, DO, have personally seen and examined this patient, and have fully participated in her care.     Additional findings: A 64 year old female presents from outpatient center.  Patient has been under a lot of stress due to her twins sister dying and her mother dying recently.  She however, is not suicidal or homicidal and states she just wants to go home.  Physical exam patient is alert, oriented 3, does not appear to be depressed or suicidal.  Heart regular rate and rhythm without murmurs.  Lungs clear to auscultation bilaterally.  No tenderness in the abdomen rebounding or guarding.  Normal bowel sounds.  Neuro-patient is alert, oriented 3, moving all extremities equally and appears to be making good decisions.      Melvern Sample, DO  09/10/14 1505

## 2014-09-10 NOTE — ED Notes (Signed)
BIBA, Patient sent from [facility]. Staff reports patient was agitated this morning, threatening fellow residents, hitting self. EMS reports they did not see any of this behavior. Upon arrival, patient calm, cooperative, A&Ox3. Patient states she does not know why she is here. Patient denies SI/HI. Patient denies pain. VSS. Patient has history of ETOH induced dementia, bipolar, seizures.

## 2014-09-10 NOTE — Discharge Instructions (Signed)
Aggressive behavior.     You are being sent home after having been diagnosed with aggressive behavior.     Aggression is a broad term with many different meanings. It might be physical:  · Against people.  · Against objects.  · Against oneself.  · Aggression might also be verbal (done with words).      People with mental disabilities and mental problems have a higher risk for aggression than others. One of the most common causes of aggressive behavior comes from using alcohol or other drugs.     It can be hard to treat a very aggressive patient. Sometimes the emergency department has to give patients their anti-anxiety medicine by mouth. But if needed, the medicine can be given by a shot (intramuscular injection). Restraints may be needed if the doctor believes that the patient might harm themselves or someone else.      Once the worst of agitation has ended, further medical and psychiatric tests can be done. Not all aggression is caused by behavior problems. The doctor may order blood work, imaging scans, and urine tests. These tests help see if there is a medical cause to the problem.      We don’t believe your condition is serious right now. But, it is important to be careful. Sometimes a problem that seems small can get serious later. This is why it is very important to come back here or go to the nearest Emergency Department if you don’t get better or if your symptoms get worse.     YOU SHOULD SEEK MEDICAL ATTENTION IMMEDIATELY, EITHER HERE OR AT THE NEAREST EMERGENCY DEPARTMENT, IF ANY OF THE FOLLOWING OCCUR:     · You think about killing yourself or others. The National Suicide Hotline number is 1-800-SUICIDE (1-800-784-2433), or 1-800-273-TALK (1-800-273-8255).  · You get a fever (a temperature higher than 100.4°F or 38°C).   · You get shortness of breath or pain in the chest.  · The aggression comes back and prescription medicines do not help control it.     If you can’t talk with your doctor, or if you  feel you need to be rechecked, come back here or go to the nearest emergency department.

## 2014-09-10 NOTE — ED Notes (Signed)
On further examination patient is having memory problems and is not cooperating with her care.  I received paperwork from [facility] showing that the patient's son is the power of attorney and the patient has paperwork stating she needs to be in a locked unit due to her agitation and confusion.  Therefore, we will transfer the patient back to the facility.    Melvern Sample, DO  09/10/14 1932

## 2014-09-11 ENCOUNTER — Ambulatory Visit (INDEPENDENT_AMBULATORY_CARE_PROVIDER_SITE_OTHER): Payer: 59 | Admitting: Surgery

## 2014-09-13 LAB — ECG 12-LEAD
Atrial Rate: 88 {beats}/min
P Axis: 51 degrees
P-R Interval: 140 ms
Q-T Interval: 370 ms
QRS Duration: 86 ms
QTC Calculation (Bezet): 447 ms
R Axis: 37 degrees
T Axis: 32 degrees
Ventricular Rate: 88 {beats}/min

## 2014-09-24 ENCOUNTER — Ambulatory Visit (INDEPENDENT_AMBULATORY_CARE_PROVIDER_SITE_OTHER): Payer: 59 | Admitting: Surgery

## 2014-09-29 ENCOUNTER — Emergency Department: Payer: 59

## 2014-09-29 ENCOUNTER — Emergency Department
Admission: EM | Admit: 2014-09-29 | Discharge: 2014-09-29 | Disposition: A | Discharge status: Home / Self Care | Payer: 59 | Attending: Emergency Medicine | Admitting: Emergency Medicine

## 2014-09-29 DIAGNOSIS — R45851 Suicidal ideations: Secondary | ICD-10-CM | POA: Insufficient documentation

## 2014-09-29 DIAGNOSIS — Z046 Encounter for general psychiatric examination, requested by authority: Secondary | ICD-10-CM

## 2014-09-29 HISTORY — DX: Hypoglycemia, unspecified: E16.2

## 2014-09-29 LAB — VH URINALYSIS WITH MICROSCOPIC
Bilirubin, UA: NEGATIVE
Blood, UA: NEGATIVE
Glucose, UA: NEGATIVE mg/dL
Ketones UA: NEGATIVE mg/dL
Leukocyte Esterase, UA: 75 Leu/uL — AB
Nitrite, UA: NEGATIVE
Protein, UR: NEGATIVE mg/dL
RBC, UA: 1 /hpf (ref 0–5)
Squam Epithel, UA: 1 /hpf (ref 0–2)
Trans Epithel, UA: <1 /hpf (ref 0–2)
Urine Specific Gravity: 1.003 (ref 1.001–1.040)
Urobilinogen, UA: NORMAL mg/dL
WBC, UA: 3 /hpf (ref 0–4)
pH, Urine: 6 pH (ref 5.0–8.0)

## 2014-09-29 LAB — COMPREHENSIVE METABOLIC PANEL
ALT: 16 U/L (ref 0–55)
AST (SGOT): 20 U/L (ref 10–42)
Albumin/Globulin Ratio: 1.22 Ratio (ref 0.70–1.50)
Albumin: 4.4 gm/dL (ref 3.5–5.0)
Alkaline Phosphatase: 65 U/L (ref 40–145)
Anion Gap: 14.5 mMol/L (ref 7.0–18.0)
BUN / Creatinine Ratio: 10.6 Ratio (ref 10.0–30.0)
BUN: 9 mg/dL (ref 7–22)
Bilirubin, Total: 0.3 mg/dL (ref 0.1–1.2)
CO2: 23.2 mMol/L (ref 20.0–30.0)
Calcium: 9.8 mg/dL (ref 8.5–10.5)
Chloride: 104 mMol/L (ref 98–110)
Creatinine: 0.85 mg/dL (ref 0.60–1.20)
EGFR: >60 mL/min/{1.73_m2}
Globulin: 3.6 gm/dL (ref 2.0–4.0)
Glucose: 78 mg/dL (ref 70–99)
Osmolality Calc: 273 mOsm/kg — ABNORMAL LOW (ref 275–300)
Potassium: 3.7 mMol/L (ref 3.5–5.3)
Protein, Total: 8 gm/dL (ref 6.0–8.3)
Sodium: 138 mMol/L (ref 136–147)

## 2014-09-29 LAB — CBC AND DIFFERENTIAL
Basophils %: 0.8 % (ref 0.0–3.0)
Basophils Absolute: 0 10*3/uL (ref 0.0–0.3)
Eosinophils %: 3.3 % (ref 0.0–7.0)
Eosinophils Absolute: 0.2 10*3/uL (ref 0.0–0.8)
Hematocrit: 41.9 % (ref 36.0–48.0)
Hemoglobin: 13.9 gm/dL (ref 12.0–16.0)
Lymphocytes Absolute: 1.7 10*3/uL (ref 0.6–5.1)
Lymphocytes: 29.6 % (ref 15.0–46.0)
MCH: 32 pg (ref 28–35)
MCHC: 33 gm/dL (ref 32–36)
MCV: 96 fL (ref 80–100)
MPV: 7 fL (ref 6.0–10.0)
Monocytes Absolute: 0.4 10*3/uL (ref 0.1–1.7)
Monocytes: 7.3 % (ref 3.0–15.0)
Neutrophils %: 59 % (ref 42.0–78.0)
Neutrophils Absolute: 3.4 10*3/uL (ref 1.7–8.6)
PLT CT: 294 10*3/uL (ref 130–440)
RBC: 4.38 10*6/uL (ref 3.80–5.00)
RDW: 11.6 % (ref 11.0–14.0)
WBC: 5.7 10*3/uL (ref 4.0–11.0)

## 2014-09-29 LAB — VH URINE DRUG SCREEN
Amphetamine: NEGATIVE
Barbiturates: NEGATIVE
Buprenorphine, Urine: NEGATIVE
Cannabinoids: NEGATIVE
Cocaine: NEGATIVE
Opiates: NEGATIVE
Phencyclidine: NEGATIVE
Urine Benzodiazepines: NEGATIVE
Urine Creatinine Random: 26.98 mg/dL
Urine Methadone Screen: NEGATIVE
Urine Oxycodone: NEGATIVE
Urine Specific Gravity: 1.005 (ref 1.001–1.040)
pH, Urine: 5.9 pH (ref 5.0–8.0)

## 2014-09-29 LAB — SALICYLATE LEVEL: Salicylate Level: <5 mg/dL — ABNORMAL LOW (ref 5.0–30.0)

## 2014-09-29 LAB — ACETAMINOPHEN LEVEL: Acetaminophen Level: <0.6 ug/mL — ABNORMAL LOW (ref 10.0–30.0)

## 2014-09-29 LAB — ETHANOL: Alcohol: <10 mg/dL (ref 0–9)

## 2014-09-29 MED ORDER — LORAZEPAM 0.5 MG PO TABS
1.0000 mg | ORAL_TABLET | Freq: Once | ORAL | Status: AC
Start: 2014-09-29 — End: 2014-09-29
  Administered 2014-09-29: 1 mg via ORAL

## 2014-09-29 MED ORDER — LORAZEPAM 0.5 MG PO TABS
ORAL_TABLET | ORAL | Status: AC
Start: 2014-09-29 — End: ?
  Filled 2014-09-29: qty 2

## 2014-09-29 NOTE — ED Notes (Signed)
[facility] states they have contacted the POA and she is to return to [facility].  It was communicated that without documentation of guardianship and speaking with that guardian it is illegal for [place] to force a PT against their will and that the PT does not meet criteria for [facility] screening as she is not suicidal/homicidal at this time.  [facility] states they are faxing guardianship papers to this Clinical research associate, will continue to update

## 2014-09-29 NOTE — ED Notes (Signed)
Report called to Shanda Bumps at [facility].

## 2014-09-29 NOTE — Consults (Signed)
[facility] Intake Assessment    Date of evaluation:  09/29/2014, 8:43 PM    PATIENT:  Virginia Reid, 64 y.o. female, for an assessment visit.  DOB:  02/06/1951  Face to Face Time:  Time spent with patient: [id number]   Patient Location:   C14/C14-A  Presenting Problem: PT and 4 pages of progress notes were dropped off at the ER by police after they were called to come and pick the PT up from [facility] for making homicidal and suicidal statements to staff.  Documentation by staff at [facility] state that the PT has stated she could kill her son for having her placed at [facility], and also that if she had to stay at [facility] she would kill herself. PT denies making the above statements and reports multiple infringements on her human rights by [facility] and states "it's a hell hole and I don't want to go back". She states that the staff does very little with patients, that she asked the director about doing more activities and when the staff heard that she had complained, they targeted her and excluded her from the activities they were then made to initiate.  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 Attempts to reach [facility] by the number posted on their website were unsuccessful and no documentation or contact information was sent with the PT.  PT was able to orient to date, day, location, situation, person, and was linear in thought with clear communication.  She did display some memory impairment in regards to the events leading up to her stay at [facility] and was open and reasonable as chart documentation was used to piece the events together.  PT also was willing to participate in SLUMS and scored 19 which falls within the dementia range.  PT  waited while this writer was able to track down a contact with [facility] who faxed a financial POA and stated that the PT's son wanted her brought back to [facility].  It was communicated to the PT's son Latrice Storlie that the FPOA did not give him power to make medical or placement decisions.  It was also communicated that the PT did not come with any information or documentation of her current level of care.  While speaking with the son, a Durable Power of Gerrit Friends was found within the PT's scanned media in a different hospital account that gave the son the legal right to make the decision to send the PT back to [facility] against her will.  PT was notified of the above information and she is willing to travel via ambulance back to [facility] and speak with her son tomorrow about being moved to new assisted living.      Chief Complaint   Patient presents with   . Psychiatric Evaluation            History of Present Illness and Current Psychiatric Treatment:     History of Presenting Illness:  dementia      Past Psychiatric History:   Onset of symptoms:  gradual  Previous diagnoses:  Yes bipolar, PT denies that she has had this diagnosis  Previous therapy: No  Previous psychiatric treatment and medication trials: Yes   Treatment and medication  compliance:  yes  Previous psychiatric hospitalizations: Yes 07/2014  Previous suicide attempts: No  History of violence: No  Access to Guns: No  Currently in treatment with no.  Education: PT has doctorate in education and human studies  Other pertinent history: Legal and PT has two sons who have Durable Power of Attorney         Substance Abuse History:  Recreational drugs: denies  Amount:  Frequency:  Last Use:  Use of alcohol: denied  Amount:  Frequency:  Last Use:  Tobacco use: No  Withdrawal Hx:  Substance Abuse Treatment Hx:  Abuse of OTC medications: none        Past Medical and Surgical History:    The following portions of the patient's history were reviewed and  updated as appropriate: allergies, current medications, past family history, past medical history, past social history, past surgical history and problem list.      Legal History:  yes    Record Review: extensive        Review Of Systems:         Psychiatric Review Of Systems:    Weight changes: No  Somatic symptoms: No  Anxiety/panic: Yes PT states she worries about her children  Self-injurious behavior/risky behavior: Yes PT denies but it is documented that she scratched her chest in an attempt to make a scene out of protest    Family History: UTO      Current Evaluation:     Eye contact:  WNL  Impulse control:  Normal    Mental Status Evaluation:  Appearance:  disheveled, older than stated age and thin & gaunt looking   Behavior:  normal   Speech:  soft   Mood:  normal   Affect:  normal   Thought Process:  normal   Thought Content:  normal   Sensorium:  person, place, time/date and situation   Cognition:  grossly intact   Insight:  fair   Judgment:  fair     SIGECAPS  Sleep: states she sleeps well  Interest: enjoys being outside and socializing  Guilt: some over the confusion   Energy: normal  Concentration: states she has no difficulty  Appetite: normal  Psychomotor agitation: none  Suicidal ideas: no  Suicidal plan: No  Homicidal ideas: no  Homicidal Plan: No  Hallucinations/Delusions: No    Describe Situational Stressors:  Spouse/partner: none  Employers: none  Living situation staying at [facility]    Other Pertinent Information:  Family Stressors: chronic mental illness, difficulty adjusting to life transitions  Support system:{SUPPORT SYSTEM: PT has sons who are supportive as well as a close family friend    Assessment - Diagnosis - Goals:     Axis I:  Dementia  F03.90 and Generalized Anxiety Disorder  F41.1  Axis II: Defer    Disposition: PT is being discharged back to [facility].  It was never identified what the PT was being seen for to begin with    Patient Status: N/A      Referral  to:  None    Review with patient: Treatment plan reviewed with the patient.  Medication risks/benefit reviewed with the patient    Cristobal Goldmann, RN

## 2014-09-29 NOTE — ED Notes (Signed)
Attempted to call report to [facility], no answer. VMT en route to pick up patient. ETA 30 mins. Will attempt again.

## 2014-09-29 NOTE — ED Provider Notes (Signed)
[facility]  EMERGENCY DEPARTMENT  History and Physical Exam     Patient Name: Virginia Reid, Virginia Reid  Encounter Date:  09/29/2014  Attending Physician: Caralyn Guile. Rachel Moulds, M.D.  Room:  C14/C14-A  Patient DOB:  Aug 06, 1950  Age: 64 y.o. female  MRN:  [id number]  PCP: Anise Salvo, MD      Diagnosis/Disposition:  MDM:     Final Impression  1. Suicidal ideation        Disposition  ED Disposition     None          Follow up  No follow-up provider specified.    Prescriptions  New Prescriptions    No medications on file         The patient is medically clear for psychiatric admission or transfer.    The patient was seen by crisis care. They felt that the patient was stable to go back to the current assisted living facility. The patient can be watched closely for any suicidal actions. The son, who does have power of attorney, agrees with this plan.          The results of diagnostic studies have been reviewed by myself. Available past medical, family, social, and surgical histories have been reviewed by myself. The clinical impression and plan have been discussed with the patient and/or the patient's family. All questions have been answered.      History of Presenting Illness:     Chief complaint: Psychiatric Evaluation      Virginia Reid is a 64 y.o. female presenting with suicidal ideation. Patient currently is a resident at [facility] assisted living. Per nursing notes the patient is had several episodes of voicing suicidal thoughts. Patient also has been aggressive toward the nursing home staff. The patient currently is denying any suicidal or homicidal thoughts. Review of the EMR and a recent psychiatric admission reveals patient does have significant memory problems. The patient has been calm and compliant throughout entire ED course.        Review of Systems:  Physical Exam:     Review of Systems   Constitutional: Negative for fever, chills and fatigue.   HENT: Negative for congestion, ear pain and  sore throat.    Respiratory: Negative for cough, chest tightness and shortness of breath.    Cardiovascular: Negative for chest pain and palpitations.   Gastrointestinal: Negative for nausea, vomiting, abdominal pain and diarrhea.   Genitourinary: Negative for dysuria, frequency and hematuria.   Musculoskeletal: Negative for myalgias and arthralgias.   Skin: Negative for rash.   Neurological: Negative for dizziness, numbness and headaches.   Hematological: Negative for adenopathy.   Psychiatric/Behavioral: Negative for dysphoric mood and agitation. The patient is nervous/anxious.      Blood pressure 108/76, pulse 88, temperature 97.9 F (36.6 C), resp. rate 18, weight 61 kg, SpO2 99 %.    Physical Exam   Constitutional: She is oriented to person, place, and time. She appears well-developed and well-nourished.   HENT:   Head: Normocephalic and atraumatic.   Mouth/Throat: Oropharynx is clear and moist. No oropharyngeal exudate.   Eyes: EOM are normal. Pupils are equal, round, and reactive to light.   Neck: Trachea normal and normal range of motion. Neck supple. No JVD present.   Cardiovascular: Normal rate, regular rhythm, normal heart sounds and normal pulses.    Pulmonary/Chest: Effort normal and breath sounds normal.   Abdominal: Soft. Bowel sounds are normal. There is no splenomegaly or hepatomegaly. There is no  tenderness. There is no rigidity, no rebound and no guarding.   Musculoskeletal: Normal range of motion.   Neurological: She is alert and oriented to person, place, and time. She has normal strength. No cranial nerve deficit or sensory deficit.   Skin: Skin is warm, dry and intact.   Psychiatric: She has a normal mood and affect. Her speech is normal. She is not agitated.   Nursing note and vitals reviewed.         Allergies & Medications:     Pt is allergic to zoloft.    Current/Home Medications    ARIPIPRAZOLE (ABILIFY) 5 MG TABLET    Take 0.5 tablets (2.5 mg total) by mouth 2 (two) times daily with  meals.    BENZTROPINE (COGENTIN) 0.5 MG TABLET    Take 1 tablet (0.5 mg total) by mouth every 6 (six) hours as needed.    FOLIC ACID (FOLVITE) 1 MG TABLET    Take 1 mg by mouth daily.    HALOPERIDOL (HALDOL) 5 MG TABLET    Take 5 mg by mouth every 4 (four) hours as needed.    LEVETIRACETAM (KEPPRA) 500 MG TABLET    Take 1 tablet (500 mg total) by mouth every 12 (twelve) hours.    LORATADINE (CLARITIN) 10 MG TABLET    Take 10 mg by mouth daily.    LORAZEPAM (ATIVAN) 0.5 MG TABLET    Take 1 tablet (0.5 mg total) by mouth 3 (three) times daily as needed for Anxiety (Mild agitation/anxiety).    LORAZEPAM (ATIVAN) 0.5 MG TABLET    Take 0.5 mg by mouth every 4 (four) hours as needed for Anxiety.    MULTIPLE VITAMIN (MULTIVITAMIN) TABLET    Take 1 tablet by mouth daily.    PROBIOTIC PRODUCT (RISAQUAD PO)    Take 230 mg by mouth daily.    VITAMIN B-1 (THIAMINE) 100 MG TABLET    Take 100 mg by mouth every other day.         Past History:     Medical: Pt has a past medical history of Alcoholism; Convulsions; Bipolar 1 disorder; Cognitive impairment; and Hypoglycemia.    Surgical: Pt  has past surgical history that includes Cholecystectomy.    Family: The family history is not on file.    Social: Pt reports that she has never smoked. She has never used smokeless tobacco. She reports that she drinks alcohol. She reports that she does not use illicit drugs.        Diagnostic Results:     Radiologic Studies  No results found.    Lab Studies  Labs Reviewed   COMPREHENSIVE METABOLIC PANEL - Abnormal; Notable for the following:     Osmolality Calc 273 (*)     All other components within normal limits   ACETAMINOPHEN LEVEL - Abnormal; Notable for the following:     Acetaminophen Level <0.6 (*)     All other components within normal limits   SALICYLATE LEVEL - Abnormal; Notable for the following:     Salicylate Level <5.0 (*)     All other components within normal limits   VH URINALYSIS WITH MICROSCOPIC - Abnormal; Notable for the  following:     Leukocyte Esterase, UA 75 (*)     All other components within normal limits   CBC AND DIFFERENTIAL   ETHANOL   VH URINE DRUG SCREEN         Procedure/EKG:  ATTESTATIONS     Areeb Corron D. Rachel Moulds, M.D.             Tessie Fass, MD  09/29/14 2126

## 2014-09-29 NOTE — ED Notes (Signed)
PT denying SI, able to orient x4, refusing to return to [facility].  Virginia Reid is POA and is not answering his phone or returning voicemails.  FPOA Virginia Reid states PT can not return home as her home is "unliveable." and states "she will just go back home, walk up to the grocery store there and buy alcohol and we will be in this mess again".  Mr. Virginia Reid is unable to give definitive information regarding PT's history of alcohol other than 07/2014 incident where PT was admitted for DT's and then transferred to [facility] and then [facility].  There is no recorded ETOH level above 0 that this Clinical research associate can find throughout PT's medical history.  PT does admit to having a DUI when she was in college and states she only drinks a beer or a glass of wine with dinner as she has hypoglycemic episodes. Currently PT does not meet criteria for psychiatric admission, is unwilling to return to [facility], and there is no guardianship order in place to force her to return there.  PT is wanting to go home to her townhouse and states she will take a cab home and use her credit card that she left there to pay the cab fare.  Contacted [facility] at a number given by the FPOA, communicated the situation and that the PT is currently in the ER with only 4 pages of daily progress notes, no medication/medical/legal information and have been on hold for 10 minutes.  Will continue to hold.

## 2014-10-07 ENCOUNTER — Encounter: Payer: Self-pay | Admitting: Clinical

## 2014-10-07 NOTE — Progress Notes (Signed)
[facility] SOP Initial Screening    [facility]  [facility]  [address] [place] [address]    Part 1 Initial Screening:           Staff:  Roslyn Smiling     Date of Screening: 10-07-14    Time: 10a    Identifying Information:      Patient Name:  Virginia Reid                             Sex:  female     Age:  64 y.o.    DOB:  [birth year]    SS#:N/A    Address:    [facility] [address]                         City/State/Zip: [place] [address]    Telephone #: [phone]    Emergency Contact:    Jancie Kercher  [phone]             Relationship to Patient:  son    Who does patient live with? ALF    [x]   Patient can return home                      []   Placement is needed      []   N/A    [x]   Patient has a Guardian or Healthcare POA?       []   N/A      Name: melodye swor   telephone number: [phone]      Does patient require transportation? Yes    Fall Risk? no      Presenting Problem and Present History: Pt recently transitioned from living independently in a townhouse she owned to living on the locked unit at [facility]. Pt wants to leave the ALF, and is very angry about being forced to live there. Has had 2 visits this past month to ED's due to making suicidal statements, and homicidal comments directed at her son who has Durable power of attorney for medical decisions, and had Pt placed at the ALF. No hx of suicidal behavior or psychiatric admissions/Tx. Pt was walking outside her townhouse at night in cold weather in her underwear, and this precipitated the move to an ALF.  Currently endorses anhedonia, feelings of worthlessness and hopelessness. Pt has limited insight , but understands her memory is failing. She has a Ph.D --she is  articulate, cooperative and pleasant. Mood is depressed and angry. Score of 7 on the PHQ-9. Alcohol abuse has been an issue since her husband committed suicide 14 years ago, but Pt does not acknowledge her drinking as a significant problem.   Pt is  fully oriented .   ALF staff requesting she be assessed for medication to assist with her memory.    Patient expectations of treatment?   "help me return home"    Current Stressors:  Financial Issues, mother's death a year ago,  twin sister died recently, living situation.      Suicidal Risk Screening:    Current Ideation:  denies   Current Plan:  denies  Current Acts:  denies  If Yes to ANY above describe: (Include description of any specific plan(s) and access to plan. Describe past acts including when occurred and any related treatment.  Identify risk factors; current plan, access to means, age 41 or older, recent rejection  or lack of social support, history of suicide in family or social circle, past history of suicide attempts, presence of self-inflicted burns/wounds, history of frequent accidents, recent losses, significant legal issues pending, increasing use of alcohol or other substances, chronic pain/health problems.)          (Assess for Protective Factors)         Protective Factors:   [x] - No organized plan  [x] - No means/access to weapons  [x] - Religious beliefs/value system  [x] - Adequate family/social support     [x] - Denies intent  [x] - Contracts reliably for safety  [] - Future oriented/reason to live  Other (Describe):  [] - Responsibilities   [x] - Children  [] - Pet(s)           Assaultive Risk Screening:    Ideation: denies   Plan:  denies  Past Acts:  denies    History of Ideation, Plan or Acts:  denies       Psychiatric History:  Previous Psychiatric History:  Denies except for meds from PCP: Abilify.  If yes, Inpatient Admissions?    Last Inpatient Admission Discharge Date:    From:    Past Diagnosis?  Bipolar, GAD, Dementia.  Previous Patient at this facility?  yes    Psychiatric Outpatient Treatment : None reported      Family History of Psychiatric History:  Mother: Bipolar    TRAUMA HISTORY Check if Present & Describe Below    [] - Emotional Abuse - *If yes: [] - Victim [] - Perpetrator [] -  Both denies   [] - Sexual Abuse.  *If yes: [] - Victim [] - Perpetrator [] - Both denies   [] - Physical Abuse.  *If yes: [] - Victim [] - Perpetrator  [] - Both denies       Significant Medical History:     [x]   Current Complaints  [x]   Long Term /chronic illness  []   Past surgeries  []   Serious accidents/injuries  []   Developmental problems  []   Hearing Problems  []   Speech Problems   []   Fall Risk no  []   Vision Problems  []   Mobility Problems  []   Head Trauma:   Date of Injury:   Cause:            Explanation of above:  Hypoglycemia, hx of seizures per Pt's chart.              Current Medications:   Current outpatient prescriptions:   .  ARIPiprazole (ABILIFY) 5 MG tablet, Take 0.5 tablets (2.5 mg total) by mouth 2 (two) times daily with meals., Disp: 30 tablet, Rfl: 0  .  benztropine (COGENTIN) 0.5 MG tablet, Take 1 tablet (0.5 mg total) by mouth every 6 (six) hours as needed., Disp: 30 tablet, Rfl: 0  .  folic acid (FOLVITE) 1 MG tablet, Take 1 mg by mouth daily., Disp: , Rfl:   .  haloperidol (HALDOL) 5 MG tablet, Take 5 mg by mouth every 4 (four) hours as needed., Disp: , Rfl:   .  levETIRAcetam (KEPPRA) 500 MG tablet, Take 1 tablet (500 mg total) by mouth every 12 (twelve) hours., Disp: 28 tablet, Rfl: 0  .  loratadine (CLARITIN) 10 MG tablet, Take 10 mg by mouth daily., Disp: , Rfl:   .  LORazepam (ATIVAN) 0.5 MG tablet, Take 1 tablet (0.5 mg total) by mouth 3 (three) times daily as needed for Anxiety (Mild agitation/anxiety)., Disp: 30 tablet, Rfl: 0  .  LORazepam (ATIVAN) 0.5 MG tablet, Take 0.5 mg by mouth every 4 (  four) hours as needed for Anxiety., Disp: , Rfl:   .  Multiple Vitamin (MULTIVITAMIN) tablet, Take 1 tablet by mouth daily., Disp: , Rfl:   .  Probiotic Product (RISAQUAD PO), Take 230 mg by mouth daily., Disp: , Rfl:   .  vitamin B-1 (THIAMINE) 100 MG tablet, Take 100 mg by mouth every other day., Disp: , Rfl:   Allergies:    Allergies   Allergen Reactions   . Zoloft [Sertraline] Anxiety            SUBSTANCE USE HISTORY History of substance abuse? [x] - Yes   [] - Denies      If Denies, Skip to Education.     Substances Admitted:  [x] -  Alcohol    [] -  Marijuana    [] -  Tranquilizers     [] - Opiates      [] -  Heroin    [] -  Barbiturates    [] -  Amphetamines    [] -  PCP    [] - Cocaine/Crack     [] -  Hallucinogens    [] - Inhalants    [] -  Prescription Abuse (Specify Below)             Other:          Yes No Question Yes No Question   [x]  []  Any known alcohol/substance abuse by family members? []  [x]  Ever arrested?   [x]  []  Relatives/friends have complained or expressed concern about use? []  []  If yes, was alcohol or drugs involved?   [x]  []  Ever had a seizure? []  [x]  Ever experienced withdrawal symptoms?   []  [x]  Ever had black outs or "lost time" when using? []  [x]  Ever used to avoid withdrawal symptoms?   [x]  []  History of DWI? [x]  []  Neglected family, work, social or financial obligations due to use?   [x]  []  Ever driven under the influence without arrest? []  [x]  Previous alcohol or drug treatment?   [x]  []  Attends AA or NA?  If yes, how often:  Attended for a few years--years ago. []  [x]  Consume more now than when using first started?        History of any withdrawal symptoms?   [] -  Blackouts  [] -  Tremors  [] -  Sweats  [] -  Delirium Tremens  [] -  Nausea/vomiting  [] -  Diarrhea  [] -  Abdominal cramps  [] -  Hallucinations - Describe:         Other:     HISTORY OF SUBSTANCE USE  (Amount, Frequency, Duration, Last Use)   Substance Age at First Use Date of   Last Use Describe Pattern of Use Over Last Month      Alcohol--started drinking more alcohol afer husband's suicide 14 years ago. Vague about whether she sees her drinking as a problem. teenage Weeks ago   Drank 1 to 3 glasses of wine per night when she lived alone.  States only became intoxicated a few times. DUI happened when she was a teenager. Twin sister was a heavy drinker.Pt went to AA more for social reasons than out of concern over her drinking.         Substance Age at First Use Date of   Last Use Describe Pattern of Use Over Last Month                     Substance Age at First Use Date of   Last Use Describe Pattern of Use Over Last Month  Education/Employment: Pt has a Ph.D , and states she has 2 Masters degrees. 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 Pt owns a townhouse where she lived prior to being placed at [facility]. She was married for 25 years--her husband committed suicide 14 years ago. Pt has two grown sons, Mardelle Matte and [place]. Marium Ragan has according to pt's chart  Durable Power of Attorney for medical decisions.  VM left today with Mardelle Matte to discuss the SOP.        MENTAL STATUS EXAM  Check One or More for Each Row,   as Appropriate   Attitude: [] -  Cooperative  [x] -  Guarded  [] -  Hostile [] -  Apathetic  [] -  Uncooperative  [] -  Defensive   Eye Contact: [x] -  Consistent [] -  Inconsistent   Self-Concept: [] -  Intact  [x] -  Helpless  [x] -  Hopeless [] -  Grandiose  [] -  Self-Deprecating    [] -  Depersonalized   Hygiene: [] -  Good [] -  Fair [] -  Poor    [x] -  Capable of Self Care [] -  Requires Assistance   Consciousness: [x] -  Alert [] -  Hyper-Alert [] -  Lethargic [] -  Clouded    [] -  Focus/Attention [] -  Unresponsive   Dress: [x] -  Appropriate [] -  Disheveled [] -  Bizarre [] -  Seductive     Orientation [x] -  Time [x] -  Place [x] -  Person [x] -  Situation   Speech: [x] -  Regular Rate/Rhythm/Tone [] -  Soft [] -  Mute    [] -  Loud [] -  Incoherent [] -  Slow [] -  Pressured   Mood: [] -  Stable [x] -  Depressed [] -  Anxious    [] -  Euphoric [] -  Guilty [] -  Irritable    [] -  Calm [] -  Grieving [x] -  Angry   Affect: [] -  Appropriate [x] -  Constricted [] -  Blunted    [] -  Inappropriate [] -  Flat [] -  Labile   Thought: [x] -  Relevant & Coherent [] -  Disorganized     [] -  Loose Associations [] -  Circumstantial     [] -  Tangential [] -  Perseveration    [] -  Racing Thoughts   Processing: [] -  Rational [x] -  Concrete [] -  Illogical   Memory: [] -  Within Normal Limits  [x] -  Short Term Impaired [] -  Amnesia   Judgment/  Insight: [] -  Good [] -  Fair [x] -  Poor   Psychotic Symptoms: [x] -  None [] -  Delusions  [] -  Paranoia [] -  Hallucinations - Describe:       Other - Describe:      Fund of Knowledge: [] -  Intact Intelligence: [] -  Average  [] -  Below Average   [x] -  Above Average    [x] -  Impaired                       DIAGNOSTIC IMPRESSION:    Initial Screening Summary: Pt has been making suicidal and homicidal statements at her ALF, and has scratched herself on her chest due to her inability to accept living in an ALF, and not being able to live independently in her home. Pt is adamant about wanting to return home, and openly threatens to walk out of the ALF.  Two ED visits in May due to suicidal comments, and homicidal comments directed at her son, Mardelle Matte who  has durable POA.   Pt endorses anhedonia, hopelessness and feelings of worthlessness. Hx of alcohol abuse. Transition to an ALF has been very traumatic for Pt leading to dramatic acting out behaviors. SOP to address transition issues, anger management and Pt's understanding of her cognitive deficits/inability to function independently.    Marland Kitchen    Psychiatrist Consulted:   Dr. Tamera Punt    Date:   10-09-2014  Time: 2p           Initial Screening Completed by:     Roslyn Smiling, LCSW  10/07/2014     Program Admission Assessment Review  I have reviewed and repeated the above assessment information and noted the following significant findings:  0-  None or

## 2014-10-08 ENCOUNTER — Encounter: Payer: Self-pay | Admitting: Clinical

## 2014-10-08 NOTE — Progress Notes (Signed)
Behavioral Health    Left VM with pt's son, Adalynne Steffensmeier, who has Durable POA--requesting he call to inform him of Pt's desire to attend the [facility] Program in [place].

## 2014-10-09 ENCOUNTER — Ambulatory Visit: Payer: 59 | Attending: Psychiatry | Admitting: Psychiatry

## 2014-10-09 DIAGNOSIS — F0391 Unspecified dementia with behavioral disturbance: Secondary | ICD-10-CM

## 2014-10-09 DIAGNOSIS — F1097 Alcohol use, unspecified with alcohol-induced persisting dementia: Secondary | ICD-10-CM

## 2014-10-09 DIAGNOSIS — F1027 Alcohol dependence with alcohol-induced persisting dementia: Secondary | ICD-10-CM

## 2014-10-09 NOTE — H&P (Signed)
Date: 10/09/2014  CHIEF COMPLAINT: "I do not know.  I came from the facility that  I am at."    HISTORY OF PRESENT ILLNESS: This is a 64 years old Caucasian  female, a resident of the [facility]. She was  transferred from the [facility].  Per the record the patient was sent to [facility] from another facility,  [facility] where she was admitted on February 16th for  inpatient treatment for alcohol withdrawal and delirium tremens.  Upon stabilization she was sent to the [facility] unit via police for  further psychiatric stabilization and placement.  She was  evaluated and treated an the [facility] and was sent to [facility].  Upon her discharge she was on Abilify 2.5 mg  b.i.d., Cogentin 0.5 mg every 6 hours p.r.n., Ativan 0.5 mg  t.i.d. p.r.n., and trazodone 100 mg at night p.r.n.  During the  hospitalizations, the patient tolerated the withdrawal well, but  her cognition more specifically her short-term memory was  mentioned to be continuously deteriorating. The patient had some  behavioral problems as well.  She was started on Abilify for  "behavioral problems."  The record also indicates that she has a  diagnosis of bipolar disorder as well.  Upon my evaluation  today, the patient appeared to be somewhat agitated, sarcastic,  uncooperative, frequently answered questions with "I do not  know, I do not know" and she frequently reminded me that she does  not want to be here.  She was sent here and she is very upset  and "humiliated by such evaluation by a psychiatric facility."  She denied any depressive, manic, psychotic, or anxiety-related  symptoms.  She also minimized her alcohol abuse and reported  that the last drink was last month and she often drank a glass  of wine daily with meals.  At the height of it she was drinking  about three glasses of wine or some beer depending on the type  of the food that she was eating.  She denied any history of  withdrawal  symptoms.  Denies any legal problems and basically  minimized all her symptoms, was not able to provide information  whether she is compliant with her medications or not.  During  the records again by Dr. Franchot Erichsen at [facility] on March, the  patient was admitted, was found to be somewhat confused.  She  also had some recent memory deficit.  She was diagnosed with  unspecified cognitive disorder, history of unspecified alcohol  related disorder with recent delirium.    PAST PSYCHIATRIC HISTORY: About 16 years ago, the patient's  husband committed suicide.  She was depressed, but does not know  if she ever took any medications.    FAMILY PSYCHIATRIC HISTORY: The patient denies.    MEDICAL HISTORY: Hypertension and "hypoglycemia."    MEDICATIONS:  1.  Abilify 2.5 mg b.i.d.  2.  Cogentin 0.5 mg q.6 h. p.r.n.  3.  Folic acid 1 mg daily.  4.  Haloperidol 5 mg every 4 hour p.r.n.  5.  Keppra 500 mg b.i.d.  6.  Claritin 10 mg daily.  7.  Lorazepam 0.5 mg t.i.d. p.r.n.  8.  Multivitamin one tablet daily.  9.  Probiotic product 230 mg daily.  10.  Vitamin B1 100 mg every other day.    ALLERGIES: To ZOLOFT.    SOCIAL HISTORY: She has a doctorate's degree called [employer] in  education.  She has two master's  degree and two bachelor's  degree mainly in education aswell.  She had worked as a principal of a  high school.  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 She denies any smoking, alcohol, or drug usage.  She is a widow.    MENTAL STATUS EVALUATION: This is a 64 years old Caucasian  female, who appears older than her stated age.  Makes  intermittent eye contact.  Somewhat agitated, unsettled,  sarcastic, and angry at the evaluation and her overall  predicament and frequently answers most of the questions while  shrugging her shoulders as "I do not know.  I am very angry" and  was partially cooperative with the interview process.  No abnormal  body movement i.e. tremors.   Speech spontaneous, mildly  pressured, but interruptible.  Mood, "I do not know" appears  agitated.  Affect mood congruent, limited to flat range.  Thought process, under inclusive and repetitive.  Thought  content, moderate preoccupation with current predicament.  No  delusion.  No suicidal, homicidal, thoughts, intent, or plan.  No hallucination.  Insight, fair to poor.  Judgment, poor.  Cognition, alert, but poorly oriented to the day of the week.  She also had some cognitive and attention related problems.  Her  slums score is 16/30 with deficits in memory and short-term and  poor attention.    ASSESSMENT: This is a 64 years old Caucasian female,  with possibly a history of having neurovegetative symptoms of  depression for the last 16 years.  Apparently had been with some  rapidly progressive cognitive decline over the last six  months and her functioning has also been deteriorating.  She had been to  at least two inpatient psychiatric facilities recently and been  transferred to a [facility] where she is  in their dementia unit and was referred for group therapy at  structured outpatient program.  Currently appears with moderate  degree of behavioral problems in the context of possible  cognitive decline and poor processing.    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  Bipolar disorder, by history.    It appears that the patient's current mental status and her  behavioral problems which probably is associated with her  underlying dementia process.  She would not benefit from the  group therapy here at structured outpatient program and it is  likely that exposure to such a group therapy would only  exacerbate her anxiety and agitation.  I presented different  options to the patient and recommended for her to continue to  take the psychotropic medications.  Although I would recommend  to  take the Abilify once a day.  Because of its long half life  and to minimize usage of Cogentin and Ativan to minimize any  paradoxical agitation or confusion due to their anticholinergic  property and paradoxical agitation because of the  benzodiazepine.  As mentioned above, she is not a suitable  candidate for the structured outpatient program.  We may  reconsider her at a future date when her behavior is better  controlled.          [id number]  DD: 10/09/2014 15:13:19  DT: 10/09/2014 18:35:42  JOB: [id number]

## 2014-10-09 NOTE — Progress Notes (Signed)
Please see my dictation.

## 2014-11-24 ENCOUNTER — Emergency Department: Payer: 59

## 2014-11-24 ENCOUNTER — Emergency Department
Admission: EM | Admit: 2014-11-24 | Discharge: 2014-11-24 | Disposition: A | Discharge status: Nursing Home (Includes ICF) | Payer: 59 | Attending: Emergency Medicine | Admitting: Emergency Medicine

## 2014-11-24 DIAGNOSIS — F1027 Alcohol dependence with alcohol-induced persisting dementia: Secondary | ICD-10-CM

## 2014-11-24 DIAGNOSIS — F0391 Unspecified dementia with behavioral disturbance: Secondary | ICD-10-CM | POA: Insufficient documentation

## 2014-11-24 DIAGNOSIS — R45851 Suicidal ideations: Secondary | ICD-10-CM | POA: Insufficient documentation

## 2014-11-24 DIAGNOSIS — F1097 Alcohol use, unspecified with alcohol-induced persisting dementia: Secondary | ICD-10-CM

## 2014-11-24 DIAGNOSIS — K439 Ventral hernia without obstruction or gangrene: Secondary | ICD-10-CM | POA: Insufficient documentation

## 2014-11-24 LAB — COMPREHENSIVE METABOLIC PANEL
ALT: 14 U/L (ref 0–55)
AST (SGOT): 22 U/L (ref 10–42)
Albumin/Globulin Ratio: 1.24 Ratio (ref 0.70–1.50)
Albumin: 4.2 gm/dL (ref 3.5–5.0)
Alkaline Phosphatase: 66 U/L (ref 40–145)
Anion Gap: 17.7 mMol/L (ref 7.0–18.0)
BUN / Creatinine Ratio: 13.8 Ratio (ref 10.0–30.0)
BUN: 11 mg/dL (ref 7–22)
Bilirubin, Total: 0.4 mg/dL (ref 0.1–1.2)
CO2: 21.3 mMol/L (ref 20.0–30.0)
Calcium: 10 mg/dL (ref 8.5–10.5)
Chloride: 103 mMol/L (ref 98–110)
Creatinine: 0.8 mg/dL (ref 0.60–1.20)
EGFR: >60 mL/min/{1.73_m2}
Globulin: 3.4 gm/dL (ref 2.0–4.0)
Glucose: 94 mg/dL (ref 70–99)
Osmolality Calc: 275 mOsm/kg (ref 275–300)
Potassium: 4 mMol/L (ref 3.5–5.3)
Protein, Total: 7.6 gm/dL (ref 6.0–8.3)
Sodium: 138 mMol/L (ref 136–147)

## 2014-11-24 LAB — CBC AND DIFFERENTIAL
Basophils %: 0.4 % (ref 0.0–3.0)
Basophils Absolute: 0 10*3/uL (ref 0.0–0.3)
Eosinophils %: 4.3 % (ref 0.0–7.0)
Eosinophils Absolute: 0.3 10*3/uL (ref 0.0–0.8)
Hematocrit: 38.9 % (ref 36.0–48.0)
Hemoglobin: 13.3 gm/dL (ref 12.0–16.0)
Lymphocytes Absolute: 1.6 10*3/uL (ref 0.6–5.1)
Lymphocytes: 21.6 % (ref 15.0–46.0)
MCH: 32 pg (ref 28–35)
MCHC: 34 gm/dL (ref 32–36)
MCV: 93 fL (ref 80–100)
MPV: 6.9 fL (ref 6.0–10.0)
Monocytes Absolute: 0.5 10*3/uL (ref 0.1–1.7)
Monocytes: 6.7 % (ref 3.0–15.0)
Neutrophils %: 66.9 % (ref 42.0–78.0)
Neutrophils Absolute: 4.8 10*3/uL (ref 1.7–8.6)
PLT CT: 265 10*3/uL (ref 130–440)
RBC: 4.17 10*6/uL (ref 3.80–5.00)
RDW: 11.5 % (ref 11.0–14.0)
WBC: 7.2 10*3/uL (ref 4.0–11.0)

## 2014-11-24 LAB — VH URINE DRUG SCREEN
Amphetamine: NEGATIVE
Barbiturates: NEGATIVE
Buprenorphine, Urine: NEGATIVE
Cannabinoids: NEGATIVE
Cocaine: NEGATIVE
Opiates: NEGATIVE
Phencyclidine: NEGATIVE
Urine Benzodiazepines: NEGATIVE
Urine Creatinine Random: 18.26 mg/dL
Urine Methadone Screen: NEGATIVE
Urine Oxycodone: NEGATIVE
Urine Specific Gravity: 1.006 (ref 1.001–1.040)
pH, Urine: 6 pH (ref 5.0–8.0)

## 2014-11-24 LAB — ETHANOL: Alcohol: <10 mg/dL (ref 0–9)

## 2014-11-24 LAB — VH URINALYSIS WITH MICROSCOPIC
Bilirubin, UA: NEGATIVE
Blood, UA: NEGATIVE
Glucose, UA: NEGATIVE mg/dL
Ketones UA: NEGATIVE mg/dL
Leukocyte Esterase, UA: 250 Leu/uL — AB
Nitrite, UA: NEGATIVE
Protein, UR: NEGATIVE mg/dL
RBC, UA: 2 /hpf (ref 0–5)
Squam Epithel, UA: <1 /hpf (ref 0–2)
Urine Specific Gravity: 1.003 (ref 1.001–1.040)
Urobilinogen, UA: NORMAL mg/dL
WBC, UA: 1 /hpf (ref 0–4)
pH, Urine: 6 pH (ref 5.0–8.0)

## 2014-11-24 LAB — ACETAMINOPHEN LEVEL: Acetaminophen Level: 0.7 ug/mL — ABNORMAL LOW (ref 10.0–30.0)

## 2014-11-24 LAB — SALICYLATE LEVEL: Salicylate Level: <5 mg/dL — ABNORMAL LOW (ref 5.0–30.0)

## 2014-11-24 LAB — TSH: TSH: 3.24 u[IU]/mL (ref 0.40–4.20)

## 2014-11-24 NOTE — ED Notes (Signed)
Sitter here to sit with patient 

## 2014-11-24 NOTE — Discharge Instructions (Signed)
For Caregivers: Improving Communication with Dementia Patients  Dementia makes it harder for your loved one to understand and be understood. This can be troubling for both of you. Remember, these problems are not your loved one's fault. They're due to the disease. These tips can help you find ways to cope.    Keep It Simple  The best way to talk to your loved one is using simple words and short sentences. Be sure you have his or her attention. Stick to one subject at a time. And use a gentle, positive tone of voice. Timing is also important. Giving information for activities that are hours away may be a waste of time and energy.     Be Patient  People with dementia often lose their short-term memory. This means they may ask the same question over and over. Although it can be frustrating, do your best to remain calm. Try changing the subject or getting your loved one to focus on a new task. Or, try to understand why the question is being asked. For example, your loved one may worry about missing an appointment or being left behind.     Use Distraction  Your loved one may try to do things that are inappropriate or unsafe. At these times, a good tactic is using distraction. Try getting your loved one to focus on something new. Your loved one may then forget what he or she was doing in the first place.     Try Statements, Not Questions  Your loved one may not want to do certain activities. Instead of arguing, phrase requests as statements rather than questions. Using visual cues, such as holding a towel when it's time for a bath, can also help.     Avoid Arguing About Reality  People with dementia may not be able to separate past from present. If this happens, don't insist on your version of reality. It may be best to change the subject or "play along" in order to avoid added stress. You should also refrain from using harsh words or angry gestures. Your loved one may not understand you. But he or she can still be  upset by your emotional cues. And if your loved one becomes very upset, stay calm. Try to redirect his or her attention to another activity.     Coping with Changes inBehavior and Personality  People with dementia may have moments when they seem perfectly normal. At other times, they may act suspicious, angry, or afraid. They can also become agitated, or see things that aren't there. If this happens, try to be understanding. For them, the world can be a very stressful place. However, if these changes are sudden, severe, or create safety issues, talk to a doctor. You should also mention any signs of depression. These include withdrawal, loss of interest in activities or food, extreme tiredness, and crying.   [address] [employer] [address] [place], [place] [address]. All rights reserved. This information is not intended as a substitute for professional medical care. Always follow your healthcare professional's instructions.          Hernia [Adult]    A hernia is a bulge of the intestines or surrounding tissues through a tear in the muscle of the abdomen or groin. This may occur as a result of excessive coughing, heavy lifting or being overweight. It can also occur at the site of prior surgery. When a hernia first appears it may be painful due to stretching and  tearing of the muscle fibers. When you lie down, the bulge should reduce in size or disappear completely. If it does not, and you are unable to flatten it with your hand, medical attention is needed at once.  Home Care:  Avoid heavy lifting and straining or any activities that cause pain in the hernia.  Follow Up  with your physician as directed by our staff.  Get Prompt Medical Attention  if any of the following occur:   Increasing size of the hernia   Increasing pain in the hernia   A hernia that does not get smaller when you lie down   Hardening of the hernia   Abdominal swelling, fever or repeated vomiting   Pain moves to the  lower right abdomen (just below the waistline) or spreads to the back   2000-2015 [employer] [address], [place], [place] [address]. All rights reserved. This information is not intended as a substitute for professional medical care. Always follow your healthcare professional's instructions.          Depression and Suicide in Older Adults  Nearly 2 million older Americans have some type of depression. Sadly, some of them even take their own lives. Yet depression among older adults is often ignored.Learn the warning signs. You may help spare a loved one needless pain. You may also save a life.    What is depression?  Depression is a mood disorder that affects the way you think and feel. The most common symptom is a feeling of deep sadness. People who are depressed also may seem tired and listless. And nothing seems to give them pleasure. It's normal to grieve or be sad sometimes. But sadness lessens or passes with time. Depression rarely goes away or improves on its own. Other symptoms of depression are:   Sleeping more or less than normal   Eating more or less than normal   Having headaches, stomachaches, or other pains that don't go away   Feeling nervous,"empty,"or worthless   Crying a great deal   Thinking or talking about suicide or death   Feeling confused or forgetful  What causes it?  The causes of depression aren't fully known. Certain chemicals in the brain play a role. Depression does run in families. And life stresses can also trigger depression in some people. That may be the case with older adults. They often face great burdens, such as the death of friends or a spouse. They may have failing health. And they are more likely to be alone, lonely, or poor.  How you can help  Often, depressed people may not want to ask for help. When they do, they may be ignored. Or, they may receive the wrong treatment. You can help by showing parents and older friends love and support. If  they seem depressed, help them find the right treatment. Talk to your doctor. Or contact a local mental health center, social service agency, or hospital. With modern treatment, no one has to suffer from depression.     [address] [employer] [address], [place], [place] [address]. All rights reserved. This information is not intended as a substitute for professional medical care. Always follow your healthcare professional's instructions.          Recognizing Suicide Warning Signs in Others  People who are thinking about suicide may not know they are depressed. Certain thoughts, feelings, and actions can be signals that let you know a person may need help.  Watch for these warning signs of suicide.    Warning signs   Threats or talk of suicide   Buying a gun or other weapon   Statements such as"I won't be a problem much longer"or"Nothing matters"   Giving away items they own, making out a will, or planning their funeral   Suddenly being happy or calm after being depressed  To be sure, ask  If you think a person you care about could be suicidal, ask,"Have you thought about suicide?"Most people will tell you the truth. If they say"yes,"they may already have a plan for how and when they will attempt it. Find out as much as you can. The more detailed the plan, and the easier it is to carry out, the more danger the person is in right now.     [address] [employer] [address] [place], [place] [address]. All rights reserved. This information is not intended as a substitute for professional medical care. Always follow your healthcare professional's instructions.

## 2014-11-24 NOTE — ED Notes (Signed)
VMT called ETA 30-45 mins.

## 2014-11-24 NOTE — ED Provider Notes (Signed)
Physician/Midlevel provider first contact with patient: 11/24/14 1618         History     Chief Complaint   Patient presents with   . Psychiatric Evaluation     Patient is a 64 y.o. female presenting with mental health disorder. The history is provided by the patient and a caregiver.   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 sHe is been reportedly mutilating herself attempting to get sunburns. She was found today with a bottle of nail polish remover and has had increased agitation.  Patient accompanied by: No one.  Degree of incapacity (severity):  Moderate  Onset quality:  Gradual  Timing:  Intermittent  Progression:  Worsening  Chronicity:  Chronic  Context: not drug abuse, not medication, not noncompliant, not recent medication change and not stressful life event    Context comment:  History of remote alcohol abuse  Treatment compliance:  All of the time  Relieved by:  Nothing  Worsened by:  Nothing tried  Ineffective treatments:  None tried  Associated symptoms: anhedonia and poor judgment    Associated symptoms: no abdominal pain, no anxiety, no appetite change, no chest pain, no decreased need for sleep, not distractible, no euphoric mood, no fatigue, no feelings of worthlessness, no headaches, no hypersomnia, no hyperventilation, no insomnia, no irritability, no psychomotor retardation, no school problems and no weight change    Risk factors: hx of mental illness    Risk factors: no neurological disease and no recent psychiatric admission             Past Medical History    Diagnosis Date   . Alcoholism    . Convulsions    . Bipolar 1 disorder    . Cognitive impairment    . Hypoglycemia        Past Surgical History   Procedure Laterality Date   . Cholecystectomy         History reviewed. No pertinent family history.    Social  History   Substance Use Topics   . Smoking status: Never Smoker    . Smokeless tobacco: Never Used      Comment: Pt denies tobacco use the past 12 months, counseling given.    . Alcohol Use: Yes      Comment: periodically       .     Allergies   Allergen Reactions   . Zoloft [Sertraline] Anxiety       Home Medications     Last Medication Reconciliation Action:  Unable to Assess Bernell List, RN 11/24/2014  4:11 PM          Status Comment           11/24/2014  4:11 PM      Patient unable to verify at this time. States she is unaware of what medications she is on.                    ARIPiprazole (ABILIFY) 5 MG tablet     Take 0.5 tablets (2.5 mg total) by mouth 2 (two) times daily with meals.  benztropine (COGENTIN) 0.5 MG tablet     Take 1 tablet (0.5 mg total) by mouth every 6 (six) hours as needed.     folic acid (FOLVITE) 1 MG tablet     Take 1 mg by mouth daily.     haloperidol (HALDOL) 5 MG tablet     Take 5 mg by mouth every 4 (four) hours as needed.     levETIRAcetam (KEPPRA) 500 MG tablet     Take 1 tablet (500 mg total) by mouth every 12 (twelve) hours.     loratadine (CLARITIN) 10 MG tablet     Take 10 mg by mouth daily.     LORazepam (ATIVAN) 0.5 MG tablet     Take 1 tablet (0.5 mg total) by mouth 3 (three) times daily as needed for Anxiety (Mild agitation/anxiety).     LORazepam (ATIVAN) 0.5 MG tablet     Take 0.5 mg by mouth every 4 (four) hours as needed for Anxiety.     Multiple Vitamin (MULTIVITAMIN) tablet     Take 1 tablet by mouth daily.     Probiotic Product (RISAQUAD PO)     Take 230 mg by mouth daily.     vitamin B-1 (THIAMINE) 100 MG tablet     Take 100 mg by mouth every other day.           Review of Systems   Constitutional: Negative  for fever, appetite change, irritability and fatigue.   HENT: Negative for congestion, ear pain, rhinorrhea and sore throat.    Eyes: Negative for discharge and redness.   Respiratory: Negative for cough.    Cardiovascular: Negative for chest pain and leg swelling.   Gastrointestinal: Negative for nausea, vomiting, abdominal pain, diarrhea, constipation and blood in stool.   Genitourinary: Negative for dysuria, frequency, hematuria, flank pain, vaginal bleeding, vaginal discharge and menstrual problem.   Musculoskeletal: Negative for arthralgias.   Skin: Negative for rash.   Neurological: Negative for seizures and headaches.   Hematological: Negative for adenopathy. Does not bruise/bleed easily.   Psychiatric/Behavioral: Positive for suicidal ideas, self-injury and agitation. Negative for homicidal ideas and hallucinations. The patient is not nervous/anxious and does not have insomnia.        Physical Exam    BP: 116/80 mmHg, Heart Rate: 90, Temp: 98.8 F (37.1 C), Resp Rate: 18, SpO2: 98 %, Weight: 59.7 kg    Physical Exam   Constitutional: She is oriented to person, place, and time. She appears well-developed and well-nourished. No distress.   HENT:   Head: Normocephalic and atraumatic.   Right Ear: External ear normal.   Left Ear: External ear normal.   Nose: Nose normal.   Mouth/Throat: Oropharynx is clear and moist. No oropharyngeal exudate.   Eyes: Conjunctivae and EOM are normal. Pupils are equal, round, and reactive to light. Right eye exhibits no discharge. Left eye exhibits no discharge. Right conjunctiva is not injected. No scleral icterus.   Neck: Normal range of motion. Neck supple. No tracheal deviation present. No thyroid mass and no thyromegaly present.   Cardiovascular: Normal rate, regular rhythm, normal heart sounds and intact distal pulses.  Exam reveals no gallop and no friction rub.    No murmur heard.  Pulmonary/Chest: Breath sounds normal. No accessory muscle usage or stridor. No respiratory  distress. She has no wheezes. She has no rales.   Abdominal: Soft. Bowel sounds are normal. She exhibits no distension and no mass. There is no hepatosplenomegaly. There is tenderness. There  is no rebound and no guarding.       Musculoskeletal: Normal range of motion. She exhibits no edema or tenderness.   Neurological: She is alert and oriented to person, place, and time. No cranial nerve deficit. She exhibits normal muscle tone. Coordination normal.   Skin: Skin is warm and dry. No rash noted. She is not diaphoretic. No erythema. No pallor.   Psychiatric: She has a normal mood and affect. Her behavior is normal.   Nursing note and vitals reviewed.        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  The patient was evaluated and their psychiatric illness has been determined by the ED MD along with appropriate ancillary mental health services, to be stable for outpatient management.  Differential diagnosis included but was not limited to depression, schizophrenia, bipolar disorder, psychosis, and suicidal or homicidal risk. The patient currently does not exhibit active suicidal or homicidal ideation and is felt to be at low risk for a suicide attempt.  The patient is not gravely disabled and has the means to obtain food and shelter.  Referrals to outpatient mental health resources have been provided prior to discharge and the patient has also been instructed to return immediately if any acute worsening their mental health occurs.  They have also been counseled to abstain from alcohol or drugs.  Diagnostic impression and plan were discussed with the patient and/or family.   Results of lab/radiology tests were reviewed and discussed with the patient and/or family. All questions were answered and concerns addressed.       Amount and/or Complexity of Data Reviewed  Clinical lab tests: ordered and reviewed  Tests in the medicine section of CPT: ordered and reviewed  Decide to obtain previous medical records or to obtain history from someone other than the patient: yes  Discuss the patient with other providers: yes  Independent visualization of images, tracings, or specimens: yes    Risk of Complications, Morbidity, and/or Mortality  Presenting problems: moderate  Diagnostic procedures: low  Management options: moderate  General comments: Pt seen by crisis care, reportedly [facility] refused to see patient in ED    Patient Progress  Patient progress: improved            Procedures    Clinical Impression & Disposition     Clinical Impression  Final diagnoses:   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    Condition at disposition: Stable             Discharge Medication List as of 11/24/2014  6:29 PM                      Fabian Sharp, MD  11/25/14 [id number]

## 2014-11-24 NOTE — ED Notes (Addendum)
Nursing supervisor called for sitter. None available at this time. Will re-evaluate at 1900.

## 2014-11-24 NOTE — ED Notes (Signed)
Patient states that her family is concerned for her. They called the PD to have her come to the ED to be evaluated, due to depression. Patient states her husband committed suicide 16 years ago, and "everyone keeps bringing that up." Patient appears very depressed and sad. Denies current suicidal or homicidal ideation. Patient states she is currently at "a hospital" but does not know which one, when asked what she is there for she states "to be evaluated." Patient requesting chapstick and a drink. RN provided to patient. No other needs at this time.

## 2014-11-24 NOTE — ED Notes (Signed)
Bed: N3-A  Expected date:   Expected time:   Means of arrival:   Comments:  Triage breakdown

## 2014-11-24 NOTE — ED Notes (Signed)
Report called to Samarah at [facility].

## 2014-11-24 NOTE — Consults (Signed)
[facility] Intake Assessment    Date of evaluation:  11/24/2014, 5:28 PM    PATIENT:  Virginia Reid, 64 y.o. female, for an assessment visit.  DOB:  1951/03/06  Face to Face Time:  Time spent with patient: 1700-1715   Patient Location:   N5/N5-A  Presenting Problem: Patient was sent from [facility] assisted [facility] today with a report of      Chief Complaint   Patient presents with   . Psychiatric Evaluation            History of Present Illness and Current Psychiatric Treatment:     History of Presenting Illness:    Med Tech that frequently cares for patient. She states that patient has significant dementia and has been very unhappy with her current situation; which is living in an assisted living. Patient apparently has threatened suicide twice today to staff, she has had multiple episodes of self-mutilating, including picking/scratching chest, and intentionally sun burning. Patient was found this morning in her room with a bottle of nail polish remover, scrubbing the walls. Patient also made threats to staff, stating she would act out. [facility] staff also stated that she is "very manipulative and will shut down if she does not want to answer a question." Patient keeps threatening to leave the unit, however she is currently in a locked unit due to her dementia. Patient also has a strong history of alcohol abuse, and recently lost her twin sister in February, her mother around the same time, and a dog. [facility] did not feel that she was appropriate to prescreen and patient is refusing to be admitted to a psych unit.                 behavioral problems    History of Presenting Illness:  dementia      Past Psychiatric History:   Onset of symptoms: gradual  Previous diagnoses: Yes bipolar, PT denies that she has had this diagnosis  Previous therapy: No  Previous psychiatric treatment and medication trials: Yes   Treatment and medication compliance: yes  Previous psychiatric hospitalizations: Yes 07/2014  Previous  suicide attempts: No  History of violence: No  Access to Guns: No  Currently in treatment with no.  Education: PT has doctorate in education and human studies  Other pertinent history: Legal and PT has two sons who have Durable Power of Attorney         Substance Abuse History:  Recreational drugs: denies  Amount:  Frequency:  Last Use:  Use of alcohol: denied  Amount:  Frequency:  Last Use:  Tobacco use: No  Withdrawal Hx:  Substance Abuse Treatment Hx:  Abuse of OTC medications: none        Past Medical and Surgical History:   The following portions of the patient's history were reviewed and updated as appropriate: allergies, current medications, past family history, past medical history, past social history, past surgical history and problem list.      Legal History: yes    Record Review: extensive        Review Of Systems:         Psychiatric Review Of Systems:    Weight changes: No  Somatic symptoms: No  Anxiety/panic: Yes PT states she worries about her children  Self-injurious behavior/risky behavior: Yes PT denies but it is documented that she scratched her chest in an attempt to make a scene out of protest    Family History: UTO      Current  Evaluation:     Eye contact: WNL  Impulse control: Normal    Mental Status Evaluation:  Appearance:  disheveled, older than stated age and thin & gaunt looking   Behavior:  normal   Speech:  soft   Mood:  normal   Affect:  normal   Thought Process:  normal   Thought Content:  normal   Sensorium:  person, place, time/date and situation   Cognition:  grossly intact   Insight:  fair   Judgment:  fair     SIGECAPS  Sleep: states she sleeps well  Interest: enjoys being outside and socializing  Guilt: some over the confusion   Energy: normal  Concentration: states she has no difficulty  Appetite: normal  Psychomotor agitation: none  Suicidal ideas: no  Suicidal plan: No  Homicidal ideas: no  Homicidal Plan: No  Hallucinations/Delusions:  No    Describe Situational Stressors:  Spouse/partner: none  Employers: none  Living situation staying at [facility] assisted [facility]    Other Pertinent Information:  Family Stressors: chronic mental illness, difficulty adjusting to life transitions  Support system:{SUPPORT SYSTEM: PT has sons who are supportive as well as a close family friend    Assessment - Diagnosis - Goals:     Axis I: Dementia F03.90 and Generalized Anxiety Disorder F41.1  Axis II: Defer    Disposition: PT is being discharged back to [facility]. I did speak with the director and gave her Dr. Elizabeth Palau recommendation of increasing 100mg  thiamine to BID and d/c abilify and start seroquel 25 mg BID    Patient Status: N/A      Referral to:  None    Review with patient: Treatment plan reviewed with the patient.  Medication risks/benefit reviewed with the patient              Marybelle Killings, RN

## 2014-11-24 NOTE — ED Notes (Signed)
RN called [facility] and talked with Med Tech that frequently cares for patient. She states that patient has significant dementia and has been very unhappy with her current situation; which is living in an assisted living. Patient apparently has threatened suicide twice today to staff, she has had multiple episodes of self-mutilating, including picking/scratching chest, and intentionally sun burning. Patient was found this morning in her room with a bottle of nail polish remover, scrubbing the walls. Patient also made threats to staff, stating she would act out. [facility] staff also stated that she is "very manipulative and will shut down if she does not want to answer a question." Patient keeps threatening to leave the unit, however she is currently in a locked unit due to her dementia. Patient also has a strong history of alcohol abuse, and recently lost her twin sister in February, her mother around the same time, and a dog.

## 2014-11-24 NOTE — ED Notes (Signed)
[facility] notified of patient.

## 2015-10-18 ENCOUNTER — Emergency Department (HOSPITAL_COMMUNITY): Payer: No Typology Code available for payment source

## 2015-10-18 ENCOUNTER — Inpatient Hospital Stay (HOSPITAL_COMMUNITY): Payer: No Typology Code available for payment source

## 2015-10-18 ENCOUNTER — Encounter (HOSPITAL_COMMUNITY): Payer: Self-pay | Admitting: Emergency Medicine

## 2015-10-18 ENCOUNTER — Inpatient Hospital Stay (HOSPITAL_COMMUNITY)
Admission: EM | Admit: 2015-10-18 | Discharge: 2015-10-20 | DRG: 383 | Disposition: A | Discharge status: Home / Self Care | Payer: No Typology Code available for payment source | Attending: Internal Medicine | Admitting: Internal Medicine

## 2015-10-18 DIAGNOSIS — D649 Anemia, unspecified: Secondary | ICD-10-CM

## 2015-10-18 DIAGNOSIS — K2970 Gastritis, unspecified, without bleeding: Secondary | ICD-10-CM | POA: Diagnosis present

## 2015-10-18 DIAGNOSIS — Z9114 Patient's other noncompliance with medication regimen: Secondary | ICD-10-CM | POA: Diagnosis not present

## 2015-10-18 DIAGNOSIS — R578 Other shock: Secondary | ICD-10-CM | POA: Diagnosis present

## 2015-10-18 DIAGNOSIS — F10230 Alcohol dependence with withdrawal, uncomplicated: Secondary | ICD-10-CM

## 2015-10-18 DIAGNOSIS — Z79899 Other long term (current) drug therapy: Secondary | ICD-10-CM

## 2015-10-18 DIAGNOSIS — R569 Unspecified convulsions: Secondary | ICD-10-CM

## 2015-10-18 DIAGNOSIS — Z8673 Personal history of transient ischemic attack (TIA), and cerebral infarction without residual deficits: Secondary | ICD-10-CM

## 2015-10-18 DIAGNOSIS — R55 Syncope and collapse: Secondary | ICD-10-CM | POA: Insufficient documentation

## 2015-10-18 DIAGNOSIS — F1020 Alcohol dependence, uncomplicated: Secondary | ICD-10-CM | POA: Diagnosis present

## 2015-10-18 DIAGNOSIS — K921 Melena: Secondary | ICD-10-CM | POA: Diagnosis present

## 2015-10-18 DIAGNOSIS — Z800 Family history of malignant neoplasm of digestive organs: Secondary | ICD-10-CM

## 2015-10-18 DIAGNOSIS — D62 Acute posthemorrhagic anemia: Secondary | ICD-10-CM | POA: Diagnosis present

## 2015-10-18 DIAGNOSIS — R413 Other amnesia: Secondary | ICD-10-CM | POA: Diagnosis present

## 2015-10-18 DIAGNOSIS — K254 Chronic or unspecified gastric ulcer with hemorrhage: Secondary | ICD-10-CM | POA: Diagnosis not present

## 2015-10-18 DIAGNOSIS — K259 Gastric ulcer, unspecified as acute or chronic, without hemorrhage or perforation: Principal | ICD-10-CM | POA: Diagnosis present

## 2015-10-18 DIAGNOSIS — F1024 Alcohol dependence with alcohol-induced mood disorder: Secondary | ICD-10-CM

## 2015-10-18 DIAGNOSIS — I10 Essential (primary) hypertension: Secondary | ICD-10-CM | POA: Diagnosis present

## 2015-10-18 DIAGNOSIS — R2681 Unsteadiness on feet: Secondary | ICD-10-CM

## 2015-10-18 DIAGNOSIS — K922 Gastrointestinal hemorrhage, unspecified: Secondary | ICD-10-CM | POA: Insufficient documentation

## 2015-10-18 DIAGNOSIS — F10239 Alcohol dependence with withdrawal, unspecified: Secondary | ICD-10-CM

## 2015-10-18 DIAGNOSIS — F329 Major depressive disorder, single episode, unspecified: Secondary | ICD-10-CM | POA: Diagnosis present

## 2015-10-18 DIAGNOSIS — R42 Dizziness and giddiness: Secondary | ICD-10-CM | POA: Diagnosis present

## 2015-10-18 DIAGNOSIS — F10930 Alcohol use, unspecified with withdrawal, uncomplicated: Secondary | ICD-10-CM

## 2015-10-18 DIAGNOSIS — Z9289 Personal history of other medical treatment: Secondary | ICD-10-CM

## 2015-10-18 HISTORY — DX: Hypoglycemia, unspecified: E16.2

## 2015-10-18 HISTORY — DX: Anxiety disorder, unspecified: F41.9

## 2015-10-18 HISTORY — DX: Personal history of other medical treatment: Z92.89

## 2015-10-18 HISTORY — DX: Hypothyroidism, unspecified: E03.9

## 2015-10-18 HISTORY — DX: Alcohol dependence with withdrawal delirium: F10.231

## 2015-10-18 HISTORY — DX: Cerebral infarction, unspecified: I63.9

## 2015-10-18 HISTORY — DX: Alcohol abuse, in remission: F10.11

## 2015-10-18 HISTORY — DX: Depression, unspecified: F32.A

## 2015-10-18 HISTORY — DX: Other specified postprocedural states: Z98.890

## 2015-10-18 HISTORY — DX: Unspecified injury of head, initial encounter: S09.90XA

## 2015-10-18 HISTORY — DX: Major depressive disorder, single episode, unspecified: F32.9

## 2015-10-18 HISTORY — DX: Anemia, unspecified: D64.9

## 2015-10-18 HISTORY — DX: Other amnesia: R41.3

## 2015-10-18 HISTORY — DX: Unspecified convulsions: R56.9

## 2015-10-18 LAB — I-STAT CHEM 8, ED
BUN: 7 mg/dL (ref 6–20)
CALCIUM ION: 1.19 mmol/L (ref 1.13–1.30)
Chloride: 113 mmol/L — ABNORMAL HIGH (ref 101–111)
Creatinine, Ser: 0.8 mg/dL (ref 0.44–1.00)
Glucose, Bld: 93 mg/dL (ref 65–99)
HEMATOCRIT: 15 % — AB (ref 36.0–46.0)
HEMOGLOBIN: 5.1 g/dL — AB (ref 12.0–15.0)
Potassium: 3.1 mmol/L — ABNORMAL LOW (ref 3.5–5.1)
Sodium: 142 mmol/L (ref 135–145)
TCO2: 19 mmol/L (ref 0–100)

## 2015-10-18 LAB — COMPREHENSIVE METABOLIC PANEL
ALK PHOS: 37 U/L — AB (ref 38–126)
ALT: 12 U/L — ABNORMAL LOW (ref 14–54)
ANION GAP: 8 (ref 5–15)
AST: 19 U/L (ref 15–41)
Albumin: 3.6 g/dL (ref 3.5–5.0)
BILIRUBIN TOTAL: 0.4 mg/dL (ref 0.3–1.2)
BUN: 7 mg/dL (ref 6–20)
CALCIUM: 8.8 mg/dL — AB (ref 8.9–10.3)
CO2: 18 mmol/L — ABNORMAL LOW (ref 22–32)
Chloride: 113 mmol/L — ABNORMAL HIGH (ref 101–111)
Creatinine, Ser: 0.82 mg/dL (ref 0.44–1.00)
GLUCOSE: 97 mg/dL (ref 65–99)
POTASSIUM: 3 mmol/L — AB (ref 3.5–5.1)
Sodium: 139 mmol/L (ref 135–145)
TOTAL PROTEIN: 6 g/dL — AB (ref 6.5–8.1)

## 2015-10-18 LAB — URINALYSIS, ROUTINE W REFLEX MICROSCOPIC
Bilirubin Urine: NEGATIVE
GLUCOSE, UA: NEGATIVE mg/dL
HGB URINE DIPSTICK: NEGATIVE
Ketones, ur: NEGATIVE mg/dL
Nitrite: NEGATIVE
PROTEIN: NEGATIVE mg/dL
SPECIFIC GRAVITY, URINE: 1.019 (ref 1.005–1.030)
pH: 6 (ref 5.0–8.0)

## 2015-10-18 LAB — CBC
HCT: 13.9 % — ABNORMAL LOW (ref 36.0–46.0)
HEMOGLOBIN: 4.4 g/dL — AB (ref 12.0–15.0)
MCH: 29.2 pg (ref 26.0–34.0)
MCHC: 30.2 g/dL (ref 30.0–36.0)
MCV: 96.5 fL (ref 78.0–100.0)
PLATELETS: 269 10*3/uL (ref 150–400)
RBC: 1.44 MIL/uL — AB (ref 3.87–5.11)
RDW: 14.9 % (ref 11.5–15.5)
WBC: 5.3 10*3/uL (ref 4.0–10.5)

## 2015-10-18 LAB — LIPASE, BLOOD: LIPASE: 27 U/L (ref 11–51)

## 2015-10-18 LAB — I-STAT TROPONIN, ED: Troponin i, poc: 0.01 ng/mL (ref 0.00–0.08)

## 2015-10-18 LAB — PREPARE RBC (CROSSMATCH)

## 2015-10-18 LAB — URINE MICROSCOPIC-ADD ON
Bacteria, UA: NONE SEEN
RBC / HPF: NONE SEEN RBC/hpf (ref 0–5)

## 2015-10-18 LAB — PROTIME-INR
INR: 1.24 (ref 0.00–1.49)
Prothrombin Time: 15.8 seconds — ABNORMAL HIGH (ref 11.6–15.2)

## 2015-10-18 LAB — ABO/RH: ABO/RH(D): A POS

## 2015-10-18 LAB — CBG MONITORING, ED
GLUCOSE-CAPILLARY: 92 mg/dL (ref 65–99)
GLUCOSE-CAPILLARY: 93 mg/dL (ref 65–99)

## 2015-10-18 LAB — POC OCCULT BLOOD, ED: Fecal Occult Bld: POSITIVE — AB

## 2015-10-18 MED ORDER — THIAMINE HCL 100 MG/ML IJ SOLN
Freq: Once | INTRAVENOUS | Status: AC
  Administered 2015-10-19: 01:00:00 via INTRAVENOUS
  Filled 2015-10-18: qty 1000

## 2015-10-18 MED ORDER — SODIUM CHLORIDE 0.9 % IV SOLN
80.0000 mg | Freq: Once | INTRAVENOUS | Status: AC
  Administered 2015-10-18: 80 mg via INTRAVENOUS
  Filled 2015-10-18: qty 80

## 2015-10-18 MED ORDER — ACETAMINOPHEN 325 MG PO TABS
650.0000 mg | ORAL_TABLET | Freq: Four times a day (QID) | ORAL | Status: DC | PRN
  Administered 2015-10-18 – 2015-10-20 (×2): 650 mg via ORAL
  Filled 2015-10-18 (×2): qty 2

## 2015-10-18 MED ORDER — ONDANSETRON HCL 4 MG PO TABS: 4.0000 mg | ORAL_TABLET | Freq: Four times a day (QID) | ORAL | Status: DC | PRN

## 2015-10-18 MED ORDER — LORAZEPAM 1 MG PO TABS: 1.0000 mg | ORAL_TABLET | Freq: Four times a day (QID) | ORAL | Status: DC | PRN

## 2015-10-18 MED ORDER — ADULT MULTIVITAMIN W/MINERALS CH
1.0000 | ORAL_TABLET | Freq: Every day | ORAL | Status: DC
  Administered 2015-10-19 – 2015-10-20 (×2): 1 via ORAL
  Filled 2015-10-18 (×2): qty 1

## 2015-10-18 MED ORDER — SODIUM CHLORIDE 0.9 % IV SOLN
8.0000 mg/h | INTRAVENOUS | Status: DC
  Administered 2015-10-18: 8 mg/h via INTRAVENOUS
  Filled 2015-10-18 (×2): qty 80

## 2015-10-18 MED ORDER — FOLIC ACID 1 MG PO TABS
1.0000 mg | ORAL_TABLET | Freq: Every day | ORAL | Status: DC
  Administered 2015-10-19 – 2015-10-20 (×2): 1 mg via ORAL
  Filled 2015-10-18 (×2): qty 1

## 2015-10-18 MED ORDER — LORAZEPAM 2 MG/ML IJ SOLN: 1.0000 mg | Freq: Four times a day (QID) | INTRAMUSCULAR | Status: DC | PRN

## 2015-10-18 MED ORDER — ACETAMINOPHEN 650 MG RE SUPP: 650.0000 mg | Freq: Four times a day (QID) | RECTAL | Status: DC | PRN

## 2015-10-18 MED ORDER — SODIUM CHLORIDE 0.9% FLUSH
3.0000 mL | Freq: Two times a day (BID) | INTRAVENOUS | Status: DC
  Administered 2015-10-19 – 2015-10-20 (×2): 3 mL via INTRAVENOUS

## 2015-10-18 MED ORDER — THIAMINE HCL 100 MG/ML IJ SOLN: 100.0000 mg | Freq: Every day | INTRAMUSCULAR | Status: DC

## 2015-10-18 MED ORDER — SODIUM CHLORIDE 0.9 % IV SOLN
10.0000 mL/h | Freq: Once | INTRAVENOUS | Status: AC
  Administered 2015-10-18: 10 mL/h via INTRAVENOUS

## 2015-10-18 MED ORDER — THIAMINE HCL 100 MG/ML IJ SOLN: Freq: Once | INTRAVENOUS | Status: DC

## 2015-10-18 MED ORDER — ONDANSETRON HCL 4 MG/2ML IJ SOLN: 4.0000 mg | Freq: Four times a day (QID) | INTRAMUSCULAR | Status: DC | PRN

## 2015-10-18 MED ORDER — LEVETIRACETAM 750 MG PO TABS
750.0000 mg | ORAL_TABLET | Freq: Two times a day (BID) | ORAL | Status: DC
  Filled 2015-10-18: qty 1

## 2015-10-18 MED ORDER — ESCITALOPRAM OXALATE 20 MG PO TABS
20.0000 mg | ORAL_TABLET | Freq: Every day | ORAL | Status: DC
  Administered 2015-10-19 – 2015-10-20 (×2): 20 mg via ORAL
  Filled 2015-10-18: qty 2
  Filled 2015-10-18: qty 1

## 2015-10-18 MED ORDER — SODIUM CHLORIDE 0.9 % IV SOLN
750.0000 mg | Freq: Two times a day (BID) | INTRAVENOUS | Status: DC
  Administered 2015-10-19 (×2): 750 mg via INTRAVENOUS
  Filled 2015-10-18 (×3): qty 7.5

## 2015-10-18 MED ORDER — VITAMIN B-1 100 MG PO TABS
100.0000 mg | ORAL_TABLET | Freq: Every day | ORAL | Status: DC
  Administered 2015-10-19 – 2015-10-20 (×2): 100 mg via ORAL
  Filled 2015-10-18 (×2): qty 1

## 2015-10-18 MED ORDER — POTASSIUM CHLORIDE 10 MEQ/100ML IV SOLN
10.0000 meq | Freq: Once | INTRAVENOUS | Status: AC
  Administered 2015-10-18: 10 meq via INTRAVENOUS
  Filled 2015-10-18: qty 100

## 2015-10-18 NOTE — ED Notes (Addendum)
Pt c/o weakness, dizziness, passed out 2 days ago, unsure if she passed out today. Pt is very pale, mucus membranes pale-- denies gi bleeding.  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

## 2015-10-18 NOTE — ED Notes (Signed)
Patient states feeling of passing out a couple of times and unsure if she passed out.

## 2015-10-18 NOTE — ED Notes (Signed)
Lab called blood ready for transfusion. IV team ordered placed in EPIC IV team has not come to the ED. Secretary paged IV team.  A 4th nurse will look for IV via [place].

## 2015-10-18 NOTE — Consult Note (Signed)
Consultation  Referring Provider: ER MD Dr Thurnell Garbe Primary Care Physician:  No primary care provider on file. Primary Gastroenterologist:  None/unassigned  Reason for Consultation:   Profound anemia/melena  HPI: Jillian Harmon is a 65 y.o. female who presented to the emergency room earlier today with complaints of weakness and several syncopal episodes at home over the past week. She complains of lightheadedness. She was also concerned about some abdominal swelling. She has history of chronic EtOH abuse but stated that she hadn't been drinking in the past 2 months. History also pertinent for depression and history of memory loss and status post cholecystectomy. Patient says she lives in [place] and is here in [place] visiting her son. She has run out of most of her medications while she's here. She has list with her that includes [place], ferrous sulfate, Keppra  , and Klonopin Patient thinks she has a history of anemia but is not certain, she does not believe she is ever had to have transfusions. She says she's been having some discomfort in her upper abdomen over the past few months initially confined to the right upper quadrant and now more in the epigastric region. She has some fullness there. She denies any nausea or vomiting no dysphagia or odynophagia. Says  her bowels have been moving but was unaware that she was having black stools She has been taking about 3 aspirin every day because she's been feeling poorly generally. She says she has had some intermittent episodes of weakness and falling over the past few months but that this significantly increased over the past week. She's not sure that she actually passed out with each of these episodes but became very weak.. Labs revealed hemoglobin of 4.4 hematocrit of 13.9 MCV of 96.5,B UN of 7 creatinine 0.8 ,liver function studies within normal limits INR 1.24. CT of the head showed chronic microvascular ischemic disease    Past  Medical History  Diagnosis Date  . Hypoglycemia   . Depression   . H/O ETOH abuse     "has not had anything to 2 months" (10/2015)  . Seizure ([place])   . Alcohol withdrawal seizure ([place])   . Delirium, withdrawal, alcoholic ([place]) 0000000  . Closed head injury 2015  . Stroke [facility]) 2007  . Anxiety and depression   . Memory loss     Past Surgical History  Procedure Laterality Date  . Cholecystectomy      Prior to Admission medications   Medication Sig Start Date End Date Taking? Authorizing Provider  ergocalciferol (VITAMIN D2) 50000 units capsule Take 50,000 Units by mouth once a week.   Yes Historical Provider, MD  escitalopram (LEXAPRO) 20 MG tablet Take 20 mg by mouth daily.   Yes Historical Provider, MD  folic acid (FOLVITE) 1 MG tablet Take 1 mg by mouth daily.   Yes Historical Provider, MD  levETIRAcetam (KEPPRA) 750 MG tablet Take 750 mg by mouth every 12 (twelve) hours.   Yes Historical Provider, MD  Multiple Vitamin (MULTIVITAMIN WITH MINERALS) TABS tablet Take 1 tablet by mouth daily.   Yes Historical Provider, MD  ranitidine (ZANTAC) 150 MG capsule Take 150 mg by mouth 2 (two) times daily.   Yes Historical Provider, MD  thiamine 100 MG tablet Take 100 mg by mouth daily.   Yes Historical Provider, MD  vitamin A 10000 UNIT capsule Take 10,000 Units by mouth 2 (two) times daily.   Yes Historical Provider, MD  vitamin C (ASCORBIC ACID) 500 MG tablet  Take 500 mg by mouth daily.   Yes Historical Provider, MD  zinc sulfate 220 (50 Zn) MG capsule Take 220 mg by mouth daily.   Yes Historical Provider, MD    Current Facility-Administered Medications  Medication Dose Route Frequency Provider Last Rate Last Dose  . 0.9 %  sodium chloride infusion  10 mL/hr Intravenous Once Francine Graven, DO      . pantoprazole (PROTONIX) 80 mg in sodium chloride 0.9 % 250 mL (0.32 mg/mL) infusion  8 mg/hr Intravenous Continuous Francine Graven, DO 25 mL/hr at 10/18/15 1529 8 mg/hr at 10/18/15 1529  .  potassium chloride 10 mEq in 100 mL IVPB  10 mEq Intravenous Once Francine Graven, DO       Current Outpatient Prescriptions  Medication Sig Dispense Refill  . ergocalciferol (VITAMIN D2) 50000 units capsule Take 50,000 Units by mouth once a week.    . escitalopram (LEXAPRO) 20 MG tablet Take 20 mg by mouth daily.    . folic acid (FOLVITE) 1 MG tablet Take 1 mg by mouth daily.    Marland Kitchen levETIRAcetam (KEPPRA) 750 MG tablet Take 750 mg by mouth every 12 (twelve) hours.    . Multiple Vitamin (MULTIVITAMIN WITH MINERALS) TABS tablet Take 1 tablet by mouth daily.    . ranitidine (ZANTAC) 150 MG capsule Take 150 mg by mouth 2 (two) times daily.    Marland Kitchen thiamine 100 MG tablet Take 100 mg by mouth daily.    . vitamin A 10000 UNIT capsule Take 10,000 Units by mouth 2 (two) times daily.    . vitamin C (ASCORBIC ACID) 500 MG tablet Take 500 mg by mouth daily.    Marland Kitchen zinc sulfate 220 (50 Zn) MG capsule Take 220 mg by mouth daily.      Allergies as of 10/18/2015  . (Not on File)    No family history on file.  Social History   Social History  . Marital Status: Widowed    Spouse Name: N/A  . Number of Children: N/A  . Years of Education: N/A   Occupational History  . Not on file.   Social History Main Topics  . Smoking status: Never Smoker   . Smokeless tobacco: Not on file  . Alcohol Use: No     Comment: socially  . Drug Use: No  . Sexual Activity: Not on file   Other Topics Concern  . Not on file   Social History Narrative  . No narrative on file    Review of Systems: Pertinent positive and negative review of systems were noted in the above HPI section.  All other review of systems was otherwise negative.  Physical Exam: Vital signs in last 24 hours: Temp:  [98.1 F (36.7 C)-98.3 F (36.8 C)] 98.3 F (36.8 C) (06/12 1553) Pulse Rate:  [80-94] 80 (06/12 1553) Resp:  [13-20] 18 (06/12 1630) BP: (106-130)/(64-79) 121/71 mmHg (06/12 1630) SpO2:  [100 %] 100 % (06/12 1553) Weight:   [120 lb (54.432 kg)] 120 lb (54.432 kg) (06/12 1322)   General:   Alert,  Well-developed, well-nourished, older white female pleasant and cooperative in NAD, pale Head:  Normocephalic and atraumatic. Eyes:  Sclera clear, no icterus.   Conjunctiva pale Ears:  Normal auditory acuity. Nose:  No deformity, discharge,  or lesions. Mouth:  No deformity or lesions.   Neck:  Supple; no masses or thyromegaly. Lungs:  Clear throughout to auscultation.   No wheezes, crackles, or rhonchi. Heart:  Regular rate and rhythm; no  murmurs, clicks, rubs,  or gallops. Abdomen:  Soft, she is tender in the epigastrium and there is a rounded mobile fullness in the epigastrium there is no palpable hepatosplenomegaly   Rectal:  Deferred -done per ER M.D. with black heme positive stool Msk:  Symmetrical without gross deformities. . Pulses:  Normal pulses noted. Extremities:  Without clubbing or edema. Neurologic:  Alert and  oriented x4;  grossly normal neurologically. Skin:  Intact without significant lesions or rashes.. Psych:  Alert and cooperative. Normal mood and affect.  Intake/Output from previous day:   Intake/Output this shift: Total I/O In: 30 [Blood:30] Out: -   Lab Results:  Recent Labs  10/18/15 1330 10/18/15 1410  WBC 5.3  --   HGB 4.4* 5.1*  HCT 13.9* 15.0*  PLT 269  --    BMET  Recent Labs  10/18/15 1330 10/18/15 1410  NA 139 142  K 3.0* 3.1*  CL 113* 113*  CO2 18*  --   GLUCOSE 97 93  BUN 7 7  CREATININE 0.82 0.80  CALCIUM 8.8*  --    LFT  Recent Labs  10/18/15 1330  PROT 6.0*  ALBUMIN 3.6  AST 19  ALT 12*  ALKPHOS 37*  BILITOT 0.4   PT/INR  Recent Labs  10/18/15 1422  LABPROT 15.8*  INR 1.24   Hepatitis Panel No results for input(s): HEPBSAG, HCVAB, HEPAIGM, HEPBIGM in the last 72 hours.   IMPRESSION:  #61 65 year old white female presenting with weakness and syncope, and found to have a profound normocytic anemia with hemoglobin of 4 and black  Hemoccult-positive stool. Most likely patient has been having a subacute GI bleed, she had been on oral iron outpatient raising the question of an underlying chronic anemia. Regular aspirin use-rule out aspirin-induced peptic ulcer disease, chronic gastritis or occult gastric /colonic lesion #2 dementia #3 depression #4 status post cholecystectomy #5 severe chronic EtOH abuse-patient unaware of any diagnosis of cirrhosis, ultrasound pending #6 seizure disorder  Plan; patient will be transfused to keep hemoglobin in the 7-8 range Cover with IV PPI twice a day We will review old records from  [place] Await abdominal ultrasound Will plan for EGD with Dr. Havery Moros in a.m. Procedure was discussed in detail with patient including risks and benefits and she is agreeable to proceed.   Addendum;  Care everywhere reviewed and patient had EGD in March 2015-moderate gastritis no H. pylori, no evidence of varices that she also had an EGD in 2014 also in [place] that showed multiple nonbleeding gastric ulcers.     Jillian Harmon  10/18/2015, 4:40 PM

## 2015-10-18 NOTE — ED Provider Notes (Signed)
CSN: [id number]     Arrival date & time 10/18/15  1302 History   First MD Initiated Contact with Patient 10/18/15 1327     Chief Complaint  Patient presents with  . Loss of Consciousness      HPI Pt was seen at 1340. Per pt, c/o gradual onset and persistence of multiple intermittent episodes of syncope for the past week. Has been associated with lightheadedness. Pt also c/o RUQ and mid-epigastric areas are "swollen" for the past several weeks. States she has not had etoh in the past 2+ months. Denies CP/palpitations, no SOB/cough, no N/V/D, no black or blood in stools, no back pain, no focal motor weakness, no tingling/numbness in extremities.      Past Medical History  Diagnosis Date  . Hypoglycemia   . Depression   . H/O ETOH abuse     "has not had anything to 2 months" (10/2015)  . Seizure ([place])   . Alcohol withdrawal seizure ([place])   . Delirium, withdrawal, alcoholic ([place]) 0000000  . Closed head injury 2015  . Stroke [facility]) 2007  . Anxiety and depression   . Memory loss   . 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 Cholecystectomy      Social History  Substance Use Topics  . Smoking status: Never Smoker   . Smokeless tobacco: None  . Alcohol Use: No     Comment: socially    Review of Systems ROS: Statement: All systems negative except as marked or noted in the HPI; Constitutional: Negative for fever and chills. ; ; Eyes: Negative for eye pain, redness and discharge. ; ; ENMT: Negative for ear pain, hoarseness, nasal congestion, sinus pressure and sore throat. ; ; Cardiovascular: Negative for chest pain, palpitations, diaphoresis, dyspnea and peripheral edema. ; ; Respiratory: Negative for cough, wheezing and stridor. ; ; Gastrointestinal: +abd "swelling." Negative for nausea, vomiting, diarrhea, abdominal pain, blood in stool, hematemesis, jaundice and rectal bleeding. .  ; ; Genitourinary: Negative for dysuria, flank pain and hematuria. ; ; Musculoskeletal: Negative for back pain and neck pain. Negative for swelling and trauma.; ; Skin: Negative for pruritus, rash, abrasions, blisters, bruising and skin lesion.; ; Neuro: Negative for headache and neck stiffness. Negative for weakness, extremity weakness, paresthesias, involuntary movement, seizure and +lightheadedness, +syncope.     Allergies  Review of patient's allergies indicates not on file.  Home Medications   Prior to Admission medications   Not on File   BP 130/70 mmHg  Pulse 94  Temp(Src) 98.2 F (36.8 C) (Oral)  Resp 16  Ht 5\' 1"  (1.549 m)  Wt 120 lb (54.432 kg)  BMI 22.69 kg/m2  SpO2 100%   Patient Vitals for the past 24 hrs:  BP Temp Temp src Pulse Resp SpO2 Height Weight  10/18/15 1553 120/70 mmHg 98.3 F (36.8 C) Oral 80 19 100 % - -  10/18/15 1530 109/68 mmHg 98.1 F (36.7 C) Oral 85 13 100 % - -  10/18/15 1505 119/79 mmHg - - 87 20 100 % - -  10/18/15 1415 106/76 mmHg - - 90 18 100 % - -  10/18/15 1400 106/64 mmHg - - 89 18 100 % - -  10/18/15 1322 130/70 mmHg 98.2 F (36.8 C) Oral 94 16 100 % 5\' 1"  (1.549 m) 120 lb (54.432 kg)    16:21:46 Orthostatic Vital Signs GM  Orthostatic Lying  - BP-  Lying: 120/71 mmHg ; Pulse- Lying: 85  Orthostatic Sitting - BP- Sitting: 100/73 mmHg ; Pulse- Sitting: 92  Orthostatic Standing at 0 minutes - BP- Standing at 0 minutes: 112/61 mmHg ; Pulse- Standing at 0 minutes: 97       Physical Exam  1345: Physical examination:  Nursing notes reviewed; Vital signs and O2 SAT reviewed;  Constitutional: Well developed, Well nourished, Well hydrated, In no acute distress; Head:  Normocephalic, atraumatic; Eyes: EOMI, PERRL, No scleral icterus. Conjunctiva pale.; ENMT: Mouth and pharynx normal, Mucous membranes moist; Neck: Supple, Full range of motion, No lymphadenopathy; Cardiovascular: Regular rate and rhythm, No gallop; Respiratory: Breath sounds  clear & equal bilaterally, No rales, rhonchi, wheezes.  Speaking full sentences with ease, Normal respiratory effort/excursion; Chest: Nontender, Movement normal; Abdomen: Soft, +mild RUQ and mid-epigastric areas tender to palp. No rebound or guarding. Nondistended, Normal bowel sounds. Rectal exam performed w/permission of pt and ED RN chaperone present.  Anal tone normal.  Non-tender, soft black stool in rectal vault, heme positive.  No fissures, no external hemorrhoids, no palp masses.; Genitourinary: No CVA tenderness; Extremities: Pulses normal, No tenderness, No edema, No calf edema or asymmetry.; Neuro: AA&Ox3, Major CN grossly intact.  Speech clear. No gross focal motor or sensory deficits in extremities.; Skin: Color pale, Warm, Dry.   ED Course  Procedures (including critical care time) Labs Review   Imaging Review  I have personally reviewed and evaluated these images and lab results as part of my medical decision-making.   EKG Interpretation   Date/Time:  Monday October 18 2015 13:23:55 EDT Ventricular Rate:  94 PR Interval:  136 QRS Duration: 85 QT Interval:  380 QTC Calculation: 475 R Axis:   56 Text Interpretation:  Sinus rhythm Abnormal R-wave progression, early  transition Minimal ST depression, anterolateral leads No old tracing to  compare Confirmed by [facility]  MD, Nunzio Cory [phone]) on 10/18/2015 1:50:02 PM      MDM  MDM Reviewed: previous chart, nursing note and vitals Reviewed previous: labs Interpretation: labs, ECG, x-ray, ultrasound and CT scan Total time providing critical care: 30-74 minutes. This excludes time spent performing separately reportable procedures and services. Consults: admitting MD   CRITICAL CARE Performed by: Alfonzo Feller Total critical care time: 35 minutes Critical care time was exclusive of separately billable procedures and treating other patients. Critical care was necessary to treat or prevent imminent or life-threatening  deterioration. Critical care was time spent personally by me on the following activities: development of treatment plan with patient and/or surrogate as well as nursing, discussions with consultants, evaluation of patient's response to treatment, examination of patient, obtaining history from patient or surrogate, ordering and performing treatments and interventions, ordering and review of laboratory studies, ordering and review of radiographic studies, pulse oximetry and re-evaluation of patient's condition.   Results for orders placed or performed during the hospital encounter of 10/18/15  CBC  Result Value Ref Range   WBC 5.3 4.0 - 10.5 K/uL   RBC 1.44 (L) 3.87 - 5.11 MIL/uL   Hemoglobin 4.4 (LL) 12.0 - 15.0 g/dL   HCT 13.9 (L) 36.0 - 46.0 %   MCV 96.5 78.0 - 100.0 fL   MCH 29.2 26.0 - 34.0 pg   MCHC 30.2 30.0 - 36.0 g/dL   RDW 14.9 11.5 - 15.5 %   Platelets 269 150 - 400 K/uL  Comprehensive metabolic panel  Result Value Ref Range   Sodium 139 135 - 145 mmol/L   Potassium 3.0 (L)  3.5 - 5.1 mmol/L   Chloride 113 (H) 101 - 111 mmol/L   CO2 18 (L) 22 - 32 mmol/L   Glucose, Bld 97 65 - 99 mg/dL   BUN 7 6 - 20 mg/dL   Creatinine, Ser 0.82 0.44 - 1.00 mg/dL   Calcium 8.8 (L) 8.9 - 10.3 mg/dL   Total Protein 6.0 (L) 6.5 - 8.1 g/dL   Albumin 3.6 3.5 - 5.0 g/dL   AST 19 15 - 41 U/L   ALT 12 (L) 14 - 54 U/L   Alkaline Phosphatase 37 (L) 38 - 126 U/L   Total Bilirubin 0.4 0.3 - 1.2 mg/dL   GFR calc non Af Amer >60 >60 mL/min   GFR calc Af Amer >60 >60 mL/min   Anion gap 8 5 - 15  Lipase, blood  Result Value Ref Range   Lipase 27 11 - 51 U/L  Protime-INR  Result Value Ref Range   Prothrombin Time 15.8 (H) 11.6 - 15.2 seconds   INR 1.24 0.00 - 1.49  CBG monitoring, ED  Result Value Ref Range   Glucose-Capillary 92 65 - 99 mg/dL  I-stat Chem 8, ED  Result Value Ref Range   Sodium 142 135 - 145 mmol/L   Potassium 3.1 (L) 3.5 - 5.1 mmol/L   Chloride 113 (H) 101 - 111 mmol/L   BUN  7 6 - 20 mg/dL   Creatinine, Ser 0.80 0.44 - 1.00 mg/dL   Glucose, Bld 93 65 - 99 mg/dL   Calcium, Ion 1.19 1.13 - 1.30 mmol/L   TCO2 19 0 - 100 mmol/L   Hemoglobin 5.1 (LL) 12.0 - 15.0 g/dL   HCT 15.0 (L) 36.0 - 46.0 %   Comment NOTIFIED PHYSICIAN   I-stat troponin, ED  Result Value Ref Range   Troponin i, poc 0.01 0.00 - 0.08 ng/mL   Comment 3          POC occult blood, ED  Result Value Ref Range   Fecal Occult Bld POSITIVE (A) NEGATIVE  Type and screen [facility]  Result Value Ref Range   ABO/RH(D) A POS    Antibody Screen NEG    Sample Expiration 10/21/2015    Unit Number [id number]    Blood Component Type RED CELLS,LR    Unit division 00    Status of Unit ALLOCATED    Transfusion Status OK TO TRANSFUSE    Crossmatch Result Compatible    Unit Number [id number]    Blood Component Type RED CELLS,LR    Unit division 00    Status of Unit ALLOCATED    Transfusion Status OK TO TRANSFUSE    Crossmatch Result Compatible    Unit Number [id number]    Blood Component Type RED CELLS,LR    Unit division 00    Status of Unit ISSUED    Transfusion Status OK TO TRANSFUSE    Crossmatch Result Compatible    Unit Number [id number]    Blood Component Type RED CELLS,LR    Unit division 00    Status of Unit ALLOCATED    Transfusion Status OK TO TRANSFUSE    Crossmatch Result Compatible   Prepare RBC  Result Value Ref Range   Order Confirmation ORDER PROCESSED BY BLOOD BANK   ABO/Rh  Result Value Ref Range   ABO/RH(D) A POS    Ct Head Wo Contrast 10/18/2015  CLINICAL DATA:  65 year old female with syncope EXAM: CT HEAD WITHOUT CONTRAST TECHNIQUE: Contiguous axial images were obtained  from the base of the skull through the vertex without intravenous contrast. COMPARISON:  None. FINDINGS: There is mild age-related atrophy and chronic microvascular ischemic changes. There is a cavum septum pellucidum and cavum vergae. No acute intracranial hemorrhage. No  mass effect or midline shift. The visualized paranasal sinuses and mastoid air cells are clear. The calvarium is intact. IMPRESSION: No acute intracranial hemorrhage. Mild age-related atrophy and chronic microvascular ischemic disease. If symptoms persist and there are no contraindications, MRI may provide better evaluation if clinically indicated Electronically Signed   By: Anner Crete M.D.   On: 10/18/2015 14:48   Dg Chest Portable 1 View 10/18/2015  CLINICAL DATA:  Syncope EXAM: PORTABLE CHEST 1 VIEW COMPARISON:  None. FINDINGS: There is slight scarring in the left base. Lungs elsewhere clear. Heart size and pulmonary vascularity are normal. No adenopathy. There is an old healed fracture of the lateral right seventh rib. IMPRESSION: Slight scarring left base.  No edema or consolidation. Electronically Signed   By: Lowella Grip III M.D.   On: 10/18/2015 14:32    1455:  Hgb 4.4. PRBC transfusion ordered. IV potassium ordered. IV protonix bolus and gtt ordered. T/C to GI [facility], case discussed, including:  HPI, pertinent PM/SHx, VS/PE, dx testing, ED course and treatment:  Agreeable to consult.   1700:  GI is currently consulting. PRBC's transfusing. [place] abd pending. Dx and testing d/w pt.  Questions answered.  Verb understanding, agreeable to admit.   T/C to [facility] Dr. Carles Collet, case discussed, including:  HPI, pertinent PM/SHx, VS/PE, dx testing, ED course and treatment:  Agreeable to admit, requests he will come to the ED for evaluation.   Francine Graven, DO 10/20/15 2248

## 2015-10-18 NOTE — H&P (Signed)
History and Physical  Doreene Nageotte [id number] DOB: [birth year] DOA: 10/18/2015   PCP: No primary care provider on file.  Referring Physician: ED/ Dr. Elise Benne  Patient coming from: Home  Chief Complaint: dizziness, passing out  HPI:  Jillian Harmon is a 65 y.o. female with medical history of depression, hypertension, alcohol withdrawal seizure, memory loss, DTs presents with one-week history of dizziness and syncope. The patient is not a great historian, and her story often changes the more she tries to recall recent events. The patient often case manager and tangential answers to many questions. As best as she can tell me, she states that she has been feeling dizzy, weak, and generally "out of it"for the past week. The patient states that she has had a number of syncopal episodes in the past week, but she cannot clarify how many, nor can she clarify the circumstances around the syncopal episode. Interestingly, she describes these syncopal episodes as "seizures"because she feels out of it when she wakes up. There is no history of tongue biting, bowel or bladder incontinence, or head injury. For the past one to days, the patient has been feeling increasingly dizzy with gait instability. In addition, the patient feels like she has been slurring her words, but denies any focal extremity weakness, headache, visual disturbance. She denies any chest pain, short of breath, nausea, vomiting, but she states that she has been having melanotic stools for the better part of a week. The patient handed me an old medical records dating back to early 2016 which lists past medical history including alcohol withdrawal seizures, alcohol dependence, DTs, and anxiety/depression. The patient states that she has been out of all her medications for at least past week since she has been here in [place] visiting her son. She is originally from [place].  In emergency department, the patient was afebrile and  hemodynamically stable. BMP showed potassium of 3.0, hemoglobin of 4.4. Otherwise platelets, LFTs, lipase, serum creatinine were unremarkable. INR was 1.24. FOBT was positive and melanotic. The patient was typed and screened for units PRBC and started on Protonix infusion.  Assessment/Plan: Acute blood loss anemia--likely upper GI bleed -Presenting hemoglobin of 4.4 -Type and screened four units PRBC-->2 units being transfused in ED -[facility] consult appreciated -INR 1.24 -continue IV protonix drip -clears for now  Syncope -Limited due to hemorrhagic shock/acute blood loss anemia -Orthostatics positive in the emergency department -Echocardiogram -Transfuse 2 more units PRBC (4 units total) -Cannot rule out generalized seizure versus alcohol withdrawal seizure -EEG -Patient has not take Keppra for over 1 week -Given the patient's clinical history of seizure--MRI brain  Alcohol dependence -She states that she has not drank in 2 months -Alcohol withdrawal protocol  Hypertension -Hold atenolol as the patient's blood pressure remains soft  Depression -Continue Lexapro  Epigastric pain -Suspect underlying peptic ulcer disease, alcoholic gastritis -Continue PPI -Lipase 27 -Normal LFTs      Past Medical History  Diagnosis Date  . Hypoglycemia   . Depression   . H/O ETOH abuse     "has not had anything to 2 months" (10/2015)  . Seizure ([place])   . Alcohol withdrawal seizure ([place])   . Delirium, withdrawal, alcoholic ([place]) 0000000  . Closed head injury 2015  . Stroke [facility]) 2007  . Anxiety and depression   . Memory loss   . 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  Cholecystectomy     Social History:  reports that she has never smoked. She does not have any smokeless tobacco history on file. She reports that she does not drink alcohol or use illicit drugs.Pt is former  principal at high school   Family history reviewed--pt cannot recall family history    Prior to Admission medications   Medication Sig Start Date End Date Taking? Authorizing Provider  ergocalciferol (VITAMIN D2) 50000 units capsule Take 50,000 Units by mouth once a week.   Yes Historical Provider, MD  escitalopram (LEXAPRO) 20 MG tablet Take 20 mg by mouth daily.   Yes Historical Provider, MD  folic acid (FOLVITE) 1 MG tablet Take 1 mg by mouth daily.   Yes Historical Provider, MD  levETIRAcetam (KEPPRA) 750 MG tablet Take 750 mg by mouth every 12 (twelve) hours.   Yes Historical Provider, MD  Multiple Vitamin (MULTIVITAMIN WITH MINERALS) TABS tablet Take 1 tablet by mouth daily.   Yes Historical Provider, MD  ranitidine (ZANTAC) 150 MG capsule Take 150 mg by mouth 2 (two) times daily.   Yes Historical Provider, MD  thiamine 100 MG tablet Take 100 mg by mouth daily.   Yes Historical Provider, MD  vitamin A 10000 UNIT capsule Take 10,000 Units by mouth 2 (two) times daily.   Yes Historical Provider, MD  vitamin C (ASCORBIC ACID) 500 MG tablet Take 500 mg by mouth daily.   Yes Historical Provider, MD  zinc sulfate 220 (50 Zn) MG capsule Take 220 mg by mouth daily.   Yes Historical Provider, MD    Review of Systems:  Constitutional:  No weight loss, night sweats, Fevers, chills Head&Eyes: No headache.  No vision loss.  No eye pain or scotoma ENT:  No Difficulty swallowing,Tooth/dental problems,Sore throat,  No ear ache, post nasal drip,  Cardio-vascular:  No chest pain, Orthopnea, PND, swelling in lower extremities palpitations  GI:  No  nausea, vomiting, diarrhea, loss of appetite, hematochezia, melena, heartburn, indigestion, Resp:  No shortness of breath with exertion or at rest. No cough. No coughing up of blood .No wheezing.No chest wall deformity  Skin:  no rash or lesions.  GU:  no dysuria, change in color of urine, no urgency or frequency. No flank pain.  Musculoskeletal:   No joint pain or swelling. No decreased range of motion. No back pain.  Psych:  No change in mood or affect. Neurologic: No headache, no dysesthesia, no focal weakness, no vision loss.  Physical Exam: Filed Vitals:   10/18/15 1615 10/18/15 1630 10/18/15 1645 10/18/15 1706  BP: 120/71 121/71 123/71 123/79  Pulse:   79 82  Temp:    98.1 F (36.7 C)  TempSrc:    Oral  Resp: 19 18 19 22   Height:      Weight:      SpO2:   100% 100%   General:  A&O x 3, NAD, nontoxic, pleasant/cooperative Head/Eye: No conjunctival hemorrhage, no icterus, Grubbs/AT, No nystagmus ENT:  No icterus,  No thrush, good dentition, no pharyngeal exudate Neck:  No masses, no lymphadenpathy, no bruits CV:  RRR, no rub, no gallop, no S3 Lung:  CTAB, good air movement, no wheeze, no rhonchi Abdomen: soft,Epigastric pain without any rebound +BS, nondistended, no peritoneal signs Ext: No cyanosis, No rashes, No petechiae, No lymphangitis, No edema Neuro: CNII-XII intact, strength 4/5 in bilateral upper and lower extremities, no dysmetria  Labs on Admission:  Basic Metabolic Panel:  Recent Labs Lab 10/18/15 1330 10/18/15 1410  NA  139 142  K 3.0* 3.1*  CL 113* 113*  CO2 18*  --   GLUCOSE 97 93  BUN 7 7  CREATININE 0.82 0.80  CALCIUM 8.8*  --    Liver Function Tests:  Recent Labs Lab 10/18/15 1330  AST 19  ALT 12*  ALKPHOS 37*  BILITOT 0.4  PROT 6.0*  ALBUMIN 3.6    Recent Labs Lab 10/18/15 1330  LIPASE 27   No results for input(s): AMMONIA in the last 168 hours. CBC:  Recent Labs Lab 10/18/15 1330 10/18/15 1410  WBC 5.3  --   HGB 4.4* 5.1*  HCT 13.9* 15.0*  MCV 96.5  --   PLT 269  --    Coagulation Profile:  Recent Labs Lab 10/18/15 1422  INR 1.24   Cardiac Enzymes: No results for input(s): CKTOTAL, CKMB, CKMBINDEX, TROPONINI in the last 168 hours. BNP: Invalid input(s): POCBNP CBG:  Recent Labs Lab 10/18/15 1351  GLUCAP 92   Urine analysis: No results found for:  COLORURINE, APPEARANCEUR, LABSPEC, PHURINE, GLUCOSEU, HGBUR, BILIRUBINUR, KETONESUR, PROTEINUR, UROBILINOGEN, NITRITE, LEUKOCYTESUR Sepsis Labs: @LABRCNTIP (procalcitonin:4,lacticidven:4) )No results found for this or any previous visit (from the past 240 hour(s)).   Radiological Exams on Admission: Ct Head Wo Contrast  10/18/2015  CLINICAL DATA:  65 year old female with syncope EXAM: CT HEAD WITHOUT CONTRAST TECHNIQUE: Contiguous axial images were obtained from the base of the skull through the vertex without intravenous contrast. COMPARISON:  None. FINDINGS: There is mild age-related atrophy and chronic microvascular ischemic changes. There is a cavum septum pellucidum and cavum vergae. No acute intracranial hemorrhage. No mass effect or midline shift. The visualized paranasal sinuses and mastoid air cells are clear. The calvarium is intact. IMPRESSION: No acute intracranial hemorrhage. Mild age-related atrophy and chronic microvascular ischemic disease. If symptoms persist and there are no contraindications, MRI may provide better evaluation if clinically indicated Electronically Signed   By: Anner Crete M.D.   On: 10/18/2015 14:48   Dg Chest Portable 1 View  10/18/2015  CLINICAL DATA:  Syncope EXAM: PORTABLE CHEST 1 VIEW COMPARISON:  None. FINDINGS: There is slight scarring in the left base. Lungs elsewhere clear. Heart size and pulmonary vascularity are normal. No adenopathy. There is an old healed fracture of the lateral right seventh rib. IMPRESSION: Slight scarring left base.  No edema or consolidation. Electronically Signed   By: Lowella Grip III M.D.   On: 10/18/2015 14:32    EKG: Independently reviewed. Sinus rhythm, nonspecific ST changes.    Time spent:60 minutes Code Status:   FULL Family Communication:  No Family at bedside Disposition Plan: expect 2 day hospitalization Consults called: [facility] DVT Prophylaxis:SCDs  Kendryck Lacroix, DO  [facility] Pager  [phone]  If 7PM-7AM, please contact night-coverage [email or web address] Password [facility] 10/18/2015, 5:39 PM

## 2015-10-18 NOTE — ED Notes (Signed)
Nurse with [place] at bedside and IV team at bedside.

## 2015-10-18 NOTE — ED Notes (Signed)
Lab called critical lab value HGB 4.4 notified EDP.

## 2015-10-19 ENCOUNTER — Ambulatory Visit (HOSPITAL_COMMUNITY): Payer: Self-pay

## 2015-10-19 ENCOUNTER — Encounter (HOSPITAL_COMMUNITY): Payer: Self-pay

## 2015-10-19 ENCOUNTER — Inpatient Hospital Stay (HOSPITAL_COMMUNITY): Payer: No Typology Code available for payment source

## 2015-10-19 ENCOUNTER — Encounter (HOSPITAL_COMMUNITY)
Admission: EM | Disposition: A | Discharge status: Home / Self Care | Payer: Self-pay | Source: Home / Self Care | Attending: Internal Medicine

## 2015-10-19 ENCOUNTER — Other Ambulatory Visit (HOSPITAL_COMMUNITY): Payer: Self-pay

## 2015-10-19 DIAGNOSIS — K254 Chronic or unspecified gastric ulcer with hemorrhage: Secondary | ICD-10-CM | POA: Diagnosis present

## 2015-10-19 DIAGNOSIS — R55 Syncope and collapse: Secondary | ICD-10-CM

## 2015-10-19 DIAGNOSIS — R2681 Unsteadiness on feet: Secondary | ICD-10-CM | POA: Diagnosis present

## 2015-10-19 DIAGNOSIS — F1020 Alcohol dependence, uncomplicated: Secondary | ICD-10-CM

## 2015-10-19 HISTORY — PX: ESOPHAGOGASTRODUODENOSCOPY: SHX5428

## 2015-10-19 LAB — ECHOCARDIOGRAM COMPLETE
E decel time: 180 msec
EERAT: 11.54
FS: 22 % — AB (ref 28–44)
HEIGHTINCHES: 61 in
IVS/LV PW RATIO, ED: 1.06
LA diam end sys: 36 mm
LA diam index: 2.37 cm/m2
LA vol A4C: 31.5 ml
LASIZE: 36 mm
LDCA: 2.27 cm2
LV E/e'average: 11.54
LV TDI E'MEDIAL: 10
LVEEMED: 11.54
LVELAT: 10.4 cm/s
LVOT diameter: 17 mm
MV Dec: 180
MV pk E vel: 120 m/s
MVPG: 6 mmHg
MVPKAVEL: 77.6 m/s
PW: 13.4 mm — AB (ref 0.6–1.1)
TDI e' lateral: 10.4
WEIGHTICAEL: 1920 [oz_av]

## 2015-10-19 LAB — URINE CULTURE

## 2015-10-19 LAB — CBC
HCT: 22 % — ABNORMAL LOW (ref 36.0–46.0)
Hemoglobin: 7.2 g/dL — ABNORMAL LOW (ref 12.0–15.0)
MCH: 28.9 pg (ref 26.0–34.0)
MCHC: 32.7 g/dL (ref 30.0–36.0)
MCV: 88.4 fL (ref 78.0–100.0)
PLATELETS: 175 10*3/uL (ref 150–400)
RBC: 2.49 MIL/uL — ABNORMAL LOW (ref 3.87–5.11)
RDW: 16.3 % — AB (ref 11.5–15.5)
WBC: 4.2 10*3/uL (ref 4.0–10.5)

## 2015-10-19 LAB — HEMOGLOBIN AND HEMATOCRIT, BLOOD
HCT: 25.9 % — ABNORMAL LOW (ref 36.0–46.0)
HEMOGLOBIN: 8.2 g/dL — AB (ref 12.0–15.0)

## 2015-10-19 LAB — GLUCOSE, CAPILLARY: Glucose-Capillary: 128 mg/dL — ABNORMAL HIGH (ref 65–99)

## 2015-10-19 SURGERY — EGD (ESOPHAGOGASTRODUODENOSCOPY)
Anesthesia: Moderate Sedation

## 2015-10-19 MED ORDER — DIPHENHYDRAMINE HCL 50 MG/ML IJ SOLN
INTRAMUSCULAR | Status: AC
  Filled 2015-10-19: qty 1

## 2015-10-19 MED ORDER — FENTANYL CITRATE (PF) 100 MCG/2ML IJ SOLN
INTRAMUSCULAR | Status: DC | PRN
  Administered 2015-10-19: 50 ug via INTRAVENOUS

## 2015-10-19 MED ORDER — FENTANYL CITRATE (PF) 100 MCG/2ML IJ SOLN
INTRAMUSCULAR | Status: AC
Start: 2015-10-19 — End: 2015-10-19
  Filled 2015-10-19: qty 4

## 2015-10-19 MED ORDER — PANTOPRAZOLE SODIUM 40 MG PO TBEC
40.0000 mg | DELAYED_RELEASE_TABLET | Freq: Two times a day (BID) | ORAL | Status: DC
  Administered 2015-10-19 – 2015-10-20 (×3): 40 mg via ORAL
  Filled 2015-10-19 (×3): qty 1

## 2015-10-19 MED ORDER — LEVETIRACETAM 750 MG PO TABS: 750.0000 mg | ORAL_TABLET | Freq: Two times a day (BID) | ORAL | Status: DC

## 2015-10-19 MED ORDER — PANTOPRAZOLE SODIUM 40 MG PO TBEC: 40.0000 mg | DELAYED_RELEASE_TABLET | Freq: Two times a day (BID) | ORAL | Status: DC

## 2015-10-19 MED ORDER — LEVETIRACETAM 500 MG PO TABS
750.0000 mg | ORAL_TABLET | Freq: Two times a day (BID) | ORAL | Status: DC
  Administered 2015-10-19 – 2015-10-20 (×2): 750 mg via ORAL
  Filled 2015-10-19 (×2): qty 1

## 2015-10-19 MED ORDER — MIDAZOLAM HCL 5 MG/ML IJ SOLN
INTRAMUSCULAR | Status: AC
  Filled 2015-10-19: qty 2

## 2015-10-19 MED ORDER — POTASSIUM CHLORIDE CRYS ER 20 MEQ PO TBCR
40.0000 meq | EXTENDED_RELEASE_TABLET | Freq: Once | ORAL | Status: AC
  Administered 2015-10-19: 40 meq via ORAL
  Filled 2015-10-19: qty 2

## 2015-10-19 MED ORDER — MIDAZOLAM HCL 10 MG/2ML IJ SOLN
INTRAMUSCULAR | Status: DC | PRN
  Administered 2015-10-19: 1 mg via INTRAVENOUS
  Administered 2015-10-19: 2 mg via INTRAVENOUS
  Administered 2015-10-19: 1 mg via INTRAVENOUS

## 2015-10-19 NOTE — Interval H&P Note (Signed)
History and Physical Interval Note:  10/19/2015 10:07 AM  Jillian Harmon  has presented today for surgery, with the diagnosis of melena  The various methods of treatment have been discussed with the patient and family. After consideration of risks, benefits and other options for treatment, the patient has consented to  Procedure(s): ESOPHAGOGASTRODUODENOSCOPY (EGD) (N/A) as a surgical intervention .  The patient's history has been reviewed, patient examined, no change in status, stable for surgery.  I have reviewed the patient's chart and labs.  Questions were answered to the patient's satisfaction.     Renelda Loma Armbruster

## 2015-10-19 NOTE — Discharge Summary (Signed)
Physician Discharge Summary  Jillian Harmon [id number] DOB: 1950/09/14 DOA: 10/18/2015  PCP: No primary care provider on file.  Admit date: 10/18/2015 Discharge date: 10/20/15 Admitted From: Home Disposition:  Home  Recommendations for Outpatient Follow-up:  1. Follow up with PCP in 1-2 weeks 2. Please obtain BMP/CBC in one week   Home Health:No Equipment/Devices:none  Discharge Condition:stable CODE STATUS:FULL Diet recommendation: Heart Healthy  Brief/Interim Summary 65 y.o. female with medical history of depression, hypertension, alcohol withdrawal seizure, memory loss, DTs presents with one-week history of dizziness and syncope. The patient is not a great historian, and her story often changes the more she tries to recall recent events. The patient often gives nonspecific and tangential answers to many questions. As best as she can tell me, she states that she has been feeling dizzy, weak, and generally "out of it"for the past week. The patient states that she has had a number of syncopal episodes in the past week, but she cannot clarify how many, nor can she clarify the circumstances around the syncopal episode. Interestingly, she describes these syncopal episodes as "seizures"because she feels out of it when she wakes up.  In emergency department, the patient was afebrile and hemodynamically stable. BMP showed potassium of 3.0, hemoglobin of 4.4. Otherwise platelets, LFTs, lipase, serum creatinine were unremarkable. INR was 1.24. FOBT was positive and melanotic. The patient was typed and screened for units PRBC and started on Protonix infusion. GI was consulted, an EGD was performed on 10/19/2015. The patient's son stated that the patient use ibuprofen and aspirin 325 mg not infrequently for pain.  Discharge Diagnoses:  Acute blood loss anemia--likely upper GI bleed -Presenting hemoglobin of 4.4 -Type and screened four units PRBC-->2 units being transfused in ED -[facility]  consult appreciated -INR 1.24 -10/19/2015 EGD-multiple clean based gastri ulcers- bx taken to r/o hpylori  -home with protonix 40 mg bid x 1 month, then once daily -no NSAIDS  Syncope -due to hemorrhagic shock/acute blood loss anemia -Orthostatics positive in the emergency department -Echocardiogram--EF 55-60%, no WMA, mild Mr/TR -Transfused 2 units PRBC -Cannot rule out generalized seizure versus alcohol withdrawal seizure -EEG -Patient has not take Keppra for over 1 week -Given the patient's clinical history of seizure--MRI brain-->neg for acute findings  Alcohol dependence -Son states that she has not drank in 6 weeks -Alcohol withdrawal protocol  Hypertension -Hold atenolol as the patient's blood pressure remains soft  Depression -Continue Lexapro  Epigastric pain -Suspect underlying peptic ulcer disease, alcoholic gastritis -Continue PPI -Lipase 27 -Normal LFTs  Seizure disorder -suspect alcohol withdrawal seizure although son states he has witnessed seizures even when pt has not had alcohol for months -continue keppra -EEG--pending -refer to outpt neurology--may need ambulatory EEG  Memory loss -refer to outpt neurology for formal eval   Discharge Instructions      Discharge Instructions    Ambulatory referral to Neurology    Complete by:  As directed   Refer to [facility];  Please eval for memory loss and seizure            Medication List    STOP taking these medications        aspirin 325 MG tablet      TAKE these medications        atenolol 50 MG tablet  Commonly known as:  TENORMIN  Take 25 mg by mouth daily.     clonazePAM 0.5 MG tablet  Commonly known as:  KLONOPIN  Take 0.5 mg by mouth 2 (  two) times daily as needed for anxiety.     escitalopram 20 MG tablet  Commonly known as:  LEXAPRO  Take 20 mg by mouth daily.     folic acid 1 MG tablet  Commonly known as:  FOLVITE  Take 1 mg by mouth daily.     levETIRAcetam 750  MG tablet  Commonly known as:  KEPPRA  Take 1 tablet (750 mg total) by mouth every 12 (twelve) hours.     multivitamin with minerals Tabs tablet  Take 1 tablet by mouth daily.     pantoprazole 40 MG tablet  Commonly known as:  PROTONIX  Take 1 tablet (40 mg total) by mouth 2 (two) times daily. X 30 days, then once daily thereafter        No Known Allergies  Consultations:  [facility]   Procedures/Studies: Ct Head Wo Contrast  10/18/2015  CLINICAL DATA:  65 year old female with syncope EXAM: CT HEAD WITHOUT CONTRAST TECHNIQUE: Contiguous axial images were obtained from the base of the skull through the vertex without intravenous contrast. COMPARISON:  None. FINDINGS: There is mild age-related atrophy and chronic microvascular ischemic changes. There is a cavum septum pellucidum and cavum vergae. No acute intracranial hemorrhage. No mass effect or midline shift. The visualized paranasal sinuses and mastoid air cells are clear. The calvarium is intact. IMPRESSION: No acute intracranial hemorrhage. Mild age-related atrophy and chronic microvascular ischemic disease. If symptoms persist and there are no contraindications, MRI may provide better evaluation if clinically indicated Electronically Signed   By: Anner Crete M.D.   On: 10/18/2015 14:48   Mr Brain Wo Contrast  10/18/2015  CLINICAL DATA:  Dizziness and weakness for 1 week, multiple syncopal episodes. History of hypertension, alcohol withdrawal seizures, memory loss, closed head injury. EXAM: MRI HEAD WITHOUT CONTRAST TECHNIQUE: Multiplanar, multiecho pulse sequences of the brain and surrounding structures were obtained without intravenous contrast. COMPARISON:  CT HEAD October 18, 2015 at 1439 hours FINDINGS: INTRACRANIAL CONTENTS: No reduced diffusion to suggest acute ischemia. No susceptibility artifact to suggest hemorrhage. Moderate ventriculomegaly on the basis of global parenchymal brain volume loss, cavum septum pellucidum  noted. Patchy supratentorial white matter T2 hyperintensities. No suspicious parenchymal signal, masses or mass effect. No abnormal extra-axial fluid collections. No extra-axial masses though, contrast enhanced sequences would be more sensitive. Normal major intracranial vascular flow voids present at skull base. ORBITS: The included ocular globes and orbital contents are non-suspicious. SINUSES: Trace paranasal sinus mucosal thickening. Small LEFT mastoid effusion. SKULL/SOFT TISSUES: No abnormal sellar expansion. No suspicious calvarial bone marrow signal. Craniocervical junction maintained. Moderate degenerative change of the included cervical spine. IMPRESSION: No acute intracranial process. Moderate global parenchymal brain volume loss and moderate chronic small vessel ischemic disease are advanced for age. Electronically Signed   By: Elon Alas M.D.   On: 10/18/2015 21:55   US Abdomen Complete  10/18/2015  CLINICAL DATA:  Right upper quadrant swelling, status post cholecystectomy EXAM: ABDOMEN ULTRASOUND COMPLETE COMPARISON:  None. FINDINGS: Gallbladder: Surgically absent Common bile duct: Diameter: 3 mm Liver: No focal lesion identified. Within normal limits in parenchymal echogenicity. IVC: No abnormality visualized. Pancreas: Visualized portion unremarkable. Spleen: Size and appearance within normal limits. Right Kidney: Length: 10.4 cm.  No mass or hydronephrosis. Left Kidney: Length: 10.7 cm its.  No mass or hydronephrosis. Abdominal aorta: No aneurysm visualized. Other findings: None. IMPRESSION: Status post cholecystectomy. Otherwise negative abdominal ultrasound. Electronically Signed   By: Julian Hy M.D.   On: 10/18/2015 18:17  Dg Chest Portable 1 View  10/18/2015  CLINICAL DATA:  Syncope EXAM: PORTABLE CHEST 1 VIEW COMPARISON:  None. FINDINGS: There is slight scarring in the left base. Lungs elsewhere clear. Heart size and pulmonary vascularity are normal. No adenopathy. There  is an old healed fracture of the lateral right seventh rib. IMPRESSION: Slight scarring left base.  No edema or consolidation. Electronically Signed   By: Lowella Grip III M.D.   On: 10/18/2015 14:32      Discharge Exam: Filed Vitals:   10/19/15 1524 10/19/15 1619  BP: 128/71 130/88  Pulse: 76 78  Temp: 97.7 F (36.5 C) 97.9 F (36.6 C)  Resp: 16 17   Filed Vitals:   10/19/15 1345 10/19/15 1415 10/19/15 1524 10/19/15 1619  BP: 139/79 140/87 128/71 130/88  Pulse: 87 79 76 78  Temp:   97.7 F (36.5 C) 97.9 F (36.6 C)  TempSrc:   Oral Oral  Resp: 18 15 16 17   Height:   5\' 1"  (1.549 m)   Weight:   55.112 kg (121 lb 8 oz)   SpO2: 98% 100% 100% 100%    General: Pt is alert, awake, not in acute distress Cardiovascular: RRR, S1/S2 +, no rubs, no gallops Respiratory: CTA bilaterally, no wheezing, no rhonchi Abdominal: Soft, NT, ND, bowel sounds + Extremities: no edema, no cyanosis   The results of significant diagnostics from this hospitalization (including imaging, microbiology, ancillary and laboratory) are listed below for reference.    Significant Diagnostic Studies: Ct Head Wo Contrast  10/18/2015  CLINICAL DATA:  64 year old female with syncope EXAM: CT HEAD WITHOUT CONTRAST TECHNIQUE: Contiguous axial images were obtained from the base of the skull through the vertex without intravenous contrast. COMPARISON:  None. FINDINGS: There is mild age-related atrophy and chronic microvascular ischemic changes. There is a cavum septum pellucidum and cavum vergae. No acute intracranial hemorrhage. No mass effect or midline shift. The visualized paranasal sinuses and mastoid air cells are clear. The calvarium is intact. IMPRESSION: No acute intracranial hemorrhage. Mild age-related atrophy and chronic microvascular ischemic disease. If symptoms persist and there are no contraindications, MRI may provide better evaluation if clinically indicated Electronically Signed   By: Anner Crete M.D.   On: 10/18/2015 14:48   Mr Brain Wo Contrast  10/18/2015  CLINICAL DATA:  Dizziness and weakness for 1 week, multiple syncopal episodes. History of hypertension, alcohol withdrawal seizures, memory loss, closed head injury. EXAM: MRI HEAD WITHOUT CONTRAST TECHNIQUE: Multiplanar, multiecho pulse sequences of the brain and surrounding structures were obtained without intravenous contrast. COMPARISON:  CT HEAD October 18, 2015 at 1439 hours FINDINGS: INTRACRANIAL CONTENTS: No reduced diffusion to suggest acute ischemia. No susceptibility artifact to suggest hemorrhage. Moderate ventriculomegaly on the basis of global parenchymal brain volume loss, cavum septum pellucidum noted. Patchy supratentorial white matter T2 hyperintensities. No suspicious parenchymal signal, masses or mass effect. No abnormal extra-axial fluid collections. No extra-axial masses though, contrast enhanced sequences would be more sensitive. Normal major intracranial vascular flow voids present at skull base. ORBITS: The included ocular globes and orbital contents are non-suspicious. SINUSES: Trace paranasal sinus mucosal thickening. Small LEFT mastoid effusion. SKULL/SOFT TISSUES: No abnormal sellar expansion. No suspicious calvarial bone marrow signal. Craniocervical junction maintained. Moderate degenerative change of the included cervical spine. IMPRESSION: No acute intracranial process. Moderate global parenchymal brain volume loss and moderate chronic small vessel ischemic disease are advanced for age. Electronically Signed   By: Elon Alas M.D.   On: 10/18/2015 21:55  US Abdomen Complete  10/18/2015  CLINICAL DATA:  Right upper quadrant swelling, status post cholecystectomy EXAM: ABDOMEN ULTRASOUND COMPLETE COMPARISON:  None. FINDINGS: Gallbladder: Surgically absent Common bile duct: Diameter: 3 mm Liver: No focal lesion identified. Within normal limits in parenchymal echogenicity. IVC: No abnormality visualized.  Pancreas: Visualized portion unremarkable. Spleen: Size and appearance within normal limits. Right Kidney: Length: 10.4 cm.  No mass or hydronephrosis. Left Kidney: Length: 10.7 cm its.  No mass or hydronephrosis. Abdominal aorta: No aneurysm visualized. Other findings: None. IMPRESSION: Status post cholecystectomy. Otherwise negative abdominal ultrasound. Electronically Signed   By: Julian Hy M.D.   On: 10/18/2015 18:17   Dg Chest Portable 1 View  10/18/2015  CLINICAL DATA:  Syncope EXAM: PORTABLE CHEST 1 VIEW COMPARISON:  None. FINDINGS: There is slight scarring in the left base. Lungs elsewhere clear. Heart size and pulmonary vascularity are normal. No adenopathy. There is an old healed fracture of the lateral right seventh rib. IMPRESSION: Slight scarring left base.  No edema or consolidation. Electronically Signed   By: Lowella Grip III M.D.   On: 10/18/2015 14:32     Microbiology: Recent Results (from the past 240 hour(s))  Culture, Urine     Status: Abnormal   Collection Time: 10/18/15  6:30 PM  Result Value Ref Range Status   Specimen Description URINE, CLEAN CATCH  Final   Special Requests NONE  Final   Culture MULTIPLE SPECIES PRESENT, SUGGEST RECOLLECTION (A)  Final   Report Status 10/19/2015 FINAL  Final     Labs: Basic Metabolic Panel:  Recent Labs Lab 10/18/15 1330 10/18/15 1410  NA 139 142  K 3.0* 3.1*  CL 113* 113*  CO2 18*  --   GLUCOSE 97 93  BUN 7 7  CREATININE 0.82 0.80  CALCIUM 8.8*  --    Liver Function Tests:  Recent Labs Lab 10/18/15 1330  AST 19  ALT 12*  ALKPHOS 37*  BILITOT 0.4  PROT 6.0*  ALBUMIN 3.6    Recent Labs Lab 10/18/15 1330  LIPASE 27   No results for input(s): AMMONIA in the last 168 hours. CBC:  Recent Labs Lab 10/18/15 1330 10/18/15 1410 10/19/15 0220 10/19/15 1207  WBC 5.3  --  4.2  --   HGB 4.4* 5.1* 7.2* 8.2*  HCT 13.9* 15.0* 22.0* 25.9*  MCV 96.5  --  88.4  --   PLT 269  --  175  --    Cardiac  Enzymes: No results for input(s): CKTOTAL, CKMB, CKMBINDEX, TROPONINI in the last 168 hours. BNP: Invalid input(s): POCBNP CBG:  Recent Labs Lab 10/18/15 1351 10/18/15 2213  GLUCAP 92 93    Time coordinating discharge:  Greater than 30 minutes  Signed:  Ahlam Piscitelli, DO [facility] Pager: [phone] 10/19/2015, 4:20 PM

## 2015-10-19 NOTE — Progress Notes (Signed)
Patient changed her mind to keep cash with her at bedside till her son comes and see if he wants to take it home if not she will send it to safe.

## 2015-10-19 NOTE — Progress Notes (Signed)
10/19/2015 6:48 PM  Was walking by patient room and noticed bed alarm was going off.  Pt had already gotten up to go to the bathroom and was coming out of the bathroom when I stepped into the room to turn the alarm off.  Pt very upset at the fact she had the alarm on, stated she "didn't want the damn thing" and "doesn't understand why she even needs it if she's being discharged tomorrow."  I attempted to explain how our falls risk assessment works and why we utilize bed alarms, but she became very hateful towards me, stating that "I don't understand why ya'll are doing something dislegal when it's a hospital, not a jail."  She is refusing at this time to have the bed alarm turned back on, despite education.  Ulice Dash, patient's primary RN, notified of situation and that bed alarm was off at this time due to patient's refusal.    Princella Pellegrini

## 2015-10-19 NOTE — Progress Notes (Signed)
   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

## 2015-10-19 NOTE — Progress Notes (Signed)
Admission note:  Arrival Method: Patient arrived to the unit in the stretcher accompanied by two nurses. Mental Orientation: Alert and oriented x 4. Telemetry: 6E-21, NSR Assessment: See doc flow sheets. Skin: Warm, dry and intact, no open areas or any bruises noted.  Assessed with Val Riles, RN. IV: Left wrist and right hand, saline lock. Pain:  Denies any pain currently. Tubes: N/A Safety Measures:  Bed in low position, call bell and phone within reach.  Fall Prevention Safety Plan: reviewed the plan, patient understood and acknowledged. Admission Screening: in progress. 6700 Orientation: Patient has been oriented to the unit, staff and to the room.

## 2015-10-19 NOTE — Op Note (Signed)
[facility] Patient Name: Jillian Harmon Procedure Date : 10/19/2015 MRN: [id number] Attending MD: Carlota Raspberry. Havery Moros , MD Date of Birth: 1951-02-12 CSN: [id number] Age: 65 Admit Type: Inpatient Procedure:                Upper GI endoscopy Indications:              anemia secondary to chronic blood loss, Melena,                            history of NSAID use Providers:                Carlota Raspberry. Havery Moros, MD, Laverta Baltimore, RN,                            Vista Lawman, RN, Cherylynn Ridges, Technician Referring MD:              Medicines:                Fentanyl 50 micrograms IV, Midazolam 4 mg IV Complications:            No immediate complications. Estimated blood loss:                            Minimal. Estimated Blood Loss:     Estimated blood loss was minimal. Procedure:                Pre-Anesthesia Assessment:                           - Prior to the procedure, a History and Physical                            was performed, and patient medications and                            allergies were reviewed. The patient's tolerance of                            previous anesthesia was also reviewed. The risks                            and benefits of the procedure and the sedation                            options and risks were discussed with the patient.                            All questions were answered, and informed consent                            was obtained. Prior Anticoagulants: The patient has                            taken aspirin, last dose was 1 day prior to  procedure. ASA Grade Assessment: III - A patient                            with severe systemic disease. After reviewing the                            risks and benefits, the patient was deemed in                            satisfactory condition to undergo the procedure.                           After obtaining informed consent, the endoscope was                             passed under direct vision. Throughout the                            procedure, the patient's blood pressure, pulse, and                            oxygen saturations were monitored continuously. The                            [id number] scope was introduced through the                            mouth, and advanced to the second part of duodenum.                            The upper GI endoscopy was accomplished without                            difficulty. The patient tolerated the procedure                            well. Scope In: Scope Out: Findings:      Esophagogastric landmarks were identified: the Z-line was found at 36       cm, the gastroesophageal junction was found at 36 cm and the upper       extent of the gastric folds was found at 36 cm from the incisors.      The exam of the esophagus was otherwise normal.      Five non-bleeding superficial gastric ulcers were found in the gastric       body and in the gastric antrum, along with erythematous gastritis near       the pylorus. The largest lesion was 8-10 mm in largest dimension. The       rest were 3-59mm in size. All were clean based with exception of one of       the smaller ulcers had a small pigmented flat spot. No high risk       stigmata of bleeding was noted. Biopsies were taken with a cold forceps       for Helicobacter pylori testing.  The exam of the stomach was otherwise normal.      The duodenal bulb and second portion of the duodenum were normal. No       evidence of old heme. Impression:               - Esophagogastric landmarks identified.                           - Non-bleeding gastric ulcers with no stigmata of                            bleeding. Biopsies obtained to rule out H pylori.                           - Normal duodenal bulb and second portion of the                            duodenum.                           Overall, suspect peptic ulcer disease is the likely                             cause for the patient's anemia / symptoms. Moderate Sedation:      Moderate (conscious) sedation was administered by the endoscopy nurse       and supervised by the endoscopist. The following parameters were       monitored: oxygen saturation, heart rate, blood pressure, and response       to care. Total physician intraservice time was 20 minutes. Recommendation:           - Return patient to hospital ward for ongoing care.                           - Full liquid diet, advance as tolerated to solids                            later today                           - Continue present medications.                           - Protonix 40mg  twice daily                           - Repeat CBC later today, if stable Hgb > 7,                            patient can be discharged home. In this setting                            would recommend repeat CBC next week to ensure                            stable                           -  She should continue protonix 40mg  BID as                            outpatient and hold all NSAIDs                           - Repeat EGD will be warranted in 6 weeks or so to                            assess for mucosal healing. We can also perform her                            screening colonoscopy at this time                           - Await pathology results. Procedure Code(s):        --- Professional ---                           [id number], Esophagogastroduodenoscopy, flexible,                            transoral; with biopsy, single or multiple                           99152, Moderate sedation services provided by the                            same physician or other qualified health care                            professional performing the diagnostic or                            therapeutic service that the sedation supports,                            requiring the presence of an independent trained                             observer to assist in the monitoring of the                            patient's level of consciousness and physiological                            status; initial 15 minutes of intraservice time,                            patient age 32 years or older Diagnosis Code(s):        --- Professional ---                           K25.9, Gastric ulcer, unspecified as acute or  chronic, without hemorrhage or perforation                           D50.0, Iron deficiency anemia secondary to blood                            loss (chronic)                           K92.1, Melena (includes Hematochezia) CPT copyright 2016 [employer]. All rights reserved. The codes documented in this report are preliminary and upon coder review may  be revised to meet current compliance requirements. Remo Lipps P. Melva Faux, MD 10/19/2015 10:36:38 AM This report has been signed electronically. Number of Addenda: 0

## 2015-10-19 NOTE — Progress Notes (Signed)
Patient refused bed alarm.    

## 2015-10-19 NOTE — H&P (View-Only) (Signed)
Consultation  Referring Provider: ER MD Dr Thurnell Garbe Primary Care Physician:  No primary care provider on file. Primary Gastroenterologist:  None/unassigned  Reason for Consultation:   Profound anemia/melena  HPI: Jillian Harmon is a 65 y.o. female who presented to the emergency room earlier today with complaints of weakness and several syncopal episodes at home over the past week. She complains of lightheadedness. She was also concerned about some abdominal swelling. She has history of chronic EtOH abuse but stated that she hadn't been drinking in the past 2 months. History also pertinent for depression and history of memory loss and status post cholecystectomy. Patient says she lives in [place] and is here in [place] visiting her son. She has run out of most of her medications while she's here. She has list with her that includes [place], ferrous sulfate, Keppra  , and Klonopin Patient thinks she has a history of anemia but is not certain, she does not believe she is ever had to have transfusions. She says she's been having some discomfort in her upper abdomen over the past few months initially confined to the right upper quadrant and now more in the epigastric region. She has some fullness there. She denies any nausea or vomiting no dysphagia or odynophagia. Says  her bowels have been moving but was unaware that she was having black stools She has been taking about 3 aspirin every day because she's been feeling poorly generally. She says she has had some intermittent episodes of weakness and falling over the past few months but that this significantly increased over the past week. She's not sure that she actually passed out with each of these episodes but became very weak.. Labs revealed hemoglobin of 4.4 hematocrit of 13.9 MCV of 96.5,B UN of 7 creatinine 0.8 ,liver function studies within normal limits INR 1.24. CT of the head showed chronic microvascular ischemic disease    Past  Medical History  Diagnosis Date  . Hypoglycemia   . Depression   . H/O ETOH abuse     "has not had anything to 2 months" (10/2015)  . Seizure ([place])   . Alcohol withdrawal seizure ([place])   . Delirium, withdrawal, alcoholic ([place]) 0000000  . Closed head injury 2015  . Stroke [facility]) 2007  . Anxiety and depression   . Memory loss     Past Surgical History  Procedure Laterality Date  . Cholecystectomy      Prior to Admission medications   Medication Sig Start Date End Date Taking? Authorizing Provider  ergocalciferol (VITAMIN D2) 50000 units capsule Take 50,000 Units by mouth once a week.   Yes Historical Provider, MD  escitalopram (LEXAPRO) 20 MG tablet Take 20 mg by mouth daily.   Yes Historical Provider, MD  folic acid (FOLVITE) 1 MG tablet Take 1 mg by mouth daily.   Yes Historical Provider, MD  levETIRAcetam (KEPPRA) 750 MG tablet Take 750 mg by mouth every 12 (twelve) hours.   Yes Historical Provider, MD  Multiple Vitamin (MULTIVITAMIN WITH MINERALS) TABS tablet Take 1 tablet by mouth daily.   Yes Historical Provider, MD  ranitidine (ZANTAC) 150 MG capsule Take 150 mg by mouth 2 (two) times daily.   Yes Historical Provider, MD  thiamine 100 MG tablet Take 100 mg by mouth daily.   Yes Historical Provider, MD  vitamin A 10000 UNIT capsule Take 10,000 Units by mouth 2 (two) times daily.   Yes Historical Provider, MD  vitamin C (ASCORBIC ACID) 500 MG tablet  Take 500 mg by mouth daily.   Yes Historical Provider, MD  zinc sulfate 220 (50 Zn) MG capsule Take 220 mg by mouth daily.   Yes Historical Provider, MD    Current Facility-Administered Medications  Medication Dose Route Frequency Provider Last Rate Last Dose  . 0.9 %  sodium chloride infusion  10 mL/hr Intravenous Once Francine Graven, DO      . pantoprazole (PROTONIX) 80 mg in sodium chloride 0.9 % 250 mL (0.32 mg/mL) infusion  8 mg/hr Intravenous Continuous Francine Graven, DO 25 mL/hr at 10/18/15 1529 8 mg/hr at 10/18/15 1529  .  potassium chloride 10 mEq in 100 mL IVPB  10 mEq Intravenous Once Francine Graven, DO       Current Outpatient Prescriptions  Medication Sig Dispense Refill  . ergocalciferol (VITAMIN D2) 50000 units capsule Take 50,000 Units by mouth once a week.    . escitalopram (LEXAPRO) 20 MG tablet Take 20 mg by mouth daily.    . folic acid (FOLVITE) 1 MG tablet Take 1 mg by mouth daily.    Marland Kitchen levETIRAcetam (KEPPRA) 750 MG tablet Take 750 mg by mouth every 12 (twelve) hours.    . Multiple Vitamin (MULTIVITAMIN WITH MINERALS) TABS tablet Take 1 tablet by mouth daily.    . ranitidine (ZANTAC) 150 MG capsule Take 150 mg by mouth 2 (two) times daily.    Marland Kitchen thiamine 100 MG tablet Take 100 mg by mouth daily.    . vitamin A 10000 UNIT capsule Take 10,000 Units by mouth 2 (two) times daily.    . vitamin C (ASCORBIC ACID) 500 MG tablet Take 500 mg by mouth daily.    Marland Kitchen zinc sulfate 220 (50 Zn) MG capsule Take 220 mg by mouth daily.      Allergies as of 10/18/2015  . (Not on File)    No family history on file.  Social History   Social History  . Marital Status: Widowed    Spouse Name: N/A  . Number of Children: N/A  . Years of Education: N/A   Occupational History  . Not on file.   Social History Main Topics  . Smoking status: Never Smoker   . Smokeless tobacco: Not on file  . Alcohol Use: No     Comment: socially  . Drug Use: No  . Sexual Activity: Not on file   Other Topics Concern  . Not on file   Social History Narrative  . No narrative on file    Review of Systems: Pertinent positive and negative review of systems were noted in the above HPI section.  All other review of systems was otherwise negative.  Physical Exam: Vital signs in last 24 hours: Temp:  [98.1 F (36.7 C)-98.3 F (36.8 C)] 98.3 F (36.8 C) (06/12 1553) Pulse Rate:  [80-94] 80 (06/12 1553) Resp:  [13-20] 18 (06/12 1630) BP: (106-130)/(64-79) 121/71 mmHg (06/12 1630) SpO2:  [100 %] 100 % (06/12 1553) Weight:   [120 lb (54.432 kg)] 120 lb (54.432 kg) (06/12 1322)   General:   Alert,  Well-developed, well-nourished, older white female pleasant and cooperative in NAD, pale Head:  Normocephalic and atraumatic. Eyes:  Sclera clear, no icterus.   Conjunctiva pale Ears:  Normal auditory acuity. Nose:  No deformity, discharge,  or lesions. Mouth:  No deformity or lesions.   Neck:  Supple; no masses or thyromegaly. Lungs:  Clear throughout to auscultation.   No wheezes, crackles, or rhonchi. Heart:  Regular rate and rhythm; no  murmurs, clicks, rubs,  or gallops. Abdomen:  Soft, she is tender in the epigastrium and there is a rounded mobile fullness in the epigastrium there is no palpable hepatosplenomegaly   Rectal:  Deferred -done per ER M.D. with black heme positive stool Msk:  Symmetrical without gross deformities. . Pulses:  Normal pulses noted. Extremities:  Without clubbing or edema. Neurologic:  Alert and  oriented x4;  grossly normal neurologically. Skin:  Intact without significant lesions or rashes.. Psych:  Alert and cooperative. Normal mood and affect.  Intake/Output from previous day:   Intake/Output this shift: Total I/O In: 30 [Blood:30] Out: -   Lab Results:  Recent Labs  10/18/15 1330 10/18/15 1410  WBC 5.3  --   HGB 4.4* 5.1*  HCT 13.9* 15.0*  PLT 269  --    BMET  Recent Labs  10/18/15 1330 10/18/15 1410  NA 139 142  K 3.0* 3.1*  CL 113* 113*  CO2 18*  --   GLUCOSE 97 93  BUN 7 7  CREATININE 0.82 0.80  CALCIUM 8.8*  --    LFT  Recent Labs  10/18/15 1330  PROT 6.0*  ALBUMIN 3.6  AST 19  ALT 12*  ALKPHOS 37*  BILITOT 0.4   PT/INR  Recent Labs  10/18/15 1422  LABPROT 15.8*  INR 1.24   Hepatitis Panel No results for input(s): HEPBSAG, HCVAB, HEPAIGM, HEPBIGM in the last 72 hours.   IMPRESSION:  #28 65 year old white female presenting with weakness and syncope, and found to have a profound normocytic anemia with hemoglobin of 4 and black  Hemoccult-positive stool. Most likely patient has been having a subacute GI bleed, she had been on oral iron outpatient raising the question of an underlying chronic anemia. Regular aspirin use-rule out aspirin-induced peptic ulcer disease, chronic gastritis or occult gastric /colonic lesion #2 dementia #3 depression #4 status post cholecystectomy #5 severe chronic EtOH abuse-patient unaware of any diagnosis of cirrhosis, ultrasound pending #6 seizure disorder  Plan; patient will be transfused to keep hemoglobin in the 7-8 range Cover with IV PPI twice a day We will review old records from  [place] Await abdominal ultrasound Will plan for EGD with Dr. Havery Moros in a.m. Procedure was discussed in detail with patient including risks and benefits and she is agreeable to proceed.   Addendum;  Care everywhere reviewed and patient had EGD in March 2015-moderate gastritis no H. pylori, no evidence of varices that she also had an EGD in 2014 also in [place] that showed multiple nonbleeding gastric ulcers.     Bruce Churilla  10/18/2015, 4:40 PM

## 2015-10-19 NOTE — Progress Notes (Signed)
  Echocardiogram 2D Echocardiogram has been performed.  Diamond Nickel 10/19/2015, 12:13 PM

## 2015-10-20 ENCOUNTER — Inpatient Hospital Stay (HOSPITAL_COMMUNITY): Payer: No Typology Code available for payment source

## 2015-10-20 ENCOUNTER — Encounter (HOSPITAL_COMMUNITY): Payer: Self-pay | Admitting: Gastroenterology

## 2015-10-20 DIAGNOSIS — K922 Gastrointestinal hemorrhage, unspecified: Secondary | ICD-10-CM

## 2015-10-20 DIAGNOSIS — F10939 Alcohol use, unspecified with withdrawal, unspecified: Secondary | ICD-10-CM

## 2015-10-20 DIAGNOSIS — R55 Syncope and collapse: Secondary | ICD-10-CM | POA: Insufficient documentation

## 2015-10-20 DIAGNOSIS — F10239 Alcohol dependence with withdrawal, unspecified: Secondary | ICD-10-CM

## 2015-10-20 DIAGNOSIS — K254 Chronic or unspecified gastric ulcer with hemorrhage: Secondary | ICD-10-CM

## 2015-10-20 DIAGNOSIS — F10230 Alcohol dependence with withdrawal, uncomplicated: Secondary | ICD-10-CM

## 2015-10-20 DIAGNOSIS — R569 Unspecified convulsions: Secondary | ICD-10-CM

## 2015-10-20 HISTORY — DX: Alcohol use, unspecified with withdrawal, unspecified: F10.939

## 2015-10-20 HISTORY — DX: Alcohol dependence with withdrawal, unspecified: F10.239

## 2015-10-20 LAB — CBC
HEMATOCRIT: 22.8 % — AB (ref 36.0–46.0)
Hemoglobin: 7.3 g/dL — ABNORMAL LOW (ref 12.0–15.0)
MCH: 28.6 pg (ref 26.0–34.0)
MCHC: 32 g/dL (ref 30.0–36.0)
MCV: 89.4 fL (ref 78.0–100.0)
PLATELETS: 209 10*3/uL (ref 150–400)
RBC: 2.55 MIL/uL — ABNORMAL LOW (ref 3.87–5.11)
RDW: 16.4 % — AB (ref 11.5–15.5)
WBC: 5.8 10*3/uL (ref 4.0–10.5)

## 2015-10-20 LAB — BASIC METABOLIC PANEL
ANION GAP: 6 (ref 5–15)
BUN: 6 mg/dL (ref 6–20)
CALCIUM: 8.6 mg/dL — AB (ref 8.9–10.3)
CO2: 21 mmol/L — ABNORMAL LOW (ref 22–32)
Chloride: 112 mmol/L — ABNORMAL HIGH (ref 101–111)
Creatinine, Ser: 0.75 mg/dL (ref 0.44–1.00)
GFR calc Af Amer: >60 mL/min (ref >60)
GLUCOSE: 101 mg/dL — AB (ref 65–99)
POTASSIUM: 4 mmol/L (ref 3.5–5.1)
SODIUM: 139 mmol/L (ref 135–145)

## 2015-10-20 NOTE — Procedures (Signed)
ELECTROENCEPHALOGRAM REPORT  Date of Study: 10/20/2015  Patient's Name: Jillian Harmon MRN: [id number] Date of Birth: 08-28-1950  Referring Provider: Orson Eva, DO  Indication: 65 y.o. female with medical history of depression, hypertension, alcohol withdrawal seizure, memory loss, DTs presents with one-week history of dizziness and syncope.  Medications: acetaminophen (TYLENOL) suppository 650 mg  L1 acetaminophen (TYLENOL) tablet 650 mg  escitalopram (LEXAPRO) tablet 20 mg  folic acid (FOLVITE) tablet 1 mg  levETIRAcetam (KEPPRA) tablet 750 mg L2 LORazepam (ATIVAN) injection 1 mg L2 LORazepam (ATIVAN) tablet 1 mg  multivitamin with minerals tablet 1 tablet L3 ondansetron (ZOFRAN) injection 4 mg L3 ondansetron (ZOFRAN) tablet 4 mg  pantoprazole (PROTONIX) EC tablet 40 mg  sodium chloride flush (NS) 0.9 % injection 3 mL L4 thiamine (B-1) injection 100 mg L4 thiamine (VITAMIN B-1) tablet 100 mg  Technical Summary: This is a multichannel digital EEG recording, using the international 10-20 placement system with electrodes applied with paste and impedances below 5000 ohms.    Description: The EEG background is symmetric, with a well-developed posterior dominant rhythm of 9 Hz, which is reactive to eye opening and closing.  Diffuse beta activity is seen, with a bilateral frontal preponderance.  No focal or generalized abnormalities are seen.  No focal or generalized epileptiform discharges are seen.  Stage II sleep is not seen, with normal and symmetric sleep patterns.  Hyperventilation and photic stimulation were not performed.  ECG revealed normal cardiac rate and rhythm.  Impression: This is a normal routine EEG of the awake and drowsy states.  A normal study does not rule out the possibility of a seizure disorder in this patient.  Jancy Sprankle R. Tomi Likens, DO

## 2015-10-20 NOTE — Progress Notes (Signed)
EEG Completed; Results Pending  

## 2015-10-20 NOTE — Progress Notes (Signed)
PROGRESS NOTE                                                                                                                                                                                                             Patient Demographics:    Jillian Harmon, is a 65 y.o. female, DOB - [birth year], [id number]  Admit date - 10/18/2015   Admitting Physician Orson Eva, MD  Outpatient Primary MD for the patient is No primary care provider on file.  LOS - 2  Outpatient Specialists:None  Chief Complaint  Patient presents with  . Loss of Consciousness       Brief Narrative   65 year old female with hypertension,? alcohol withdrawal seizures, history of DTs, on Keppra, depression presented with dizziness and syncope in the setting of upper GI bleed with acute blood loss anemia. Hemoglobin of 4.4 on admission improving to unit PRBC. EGD done showing multiple clean-based gastric ulcers.   Subjective:   Patient has mild epigastric discomfort, no nausea, vomiting or.stools. Denies any dizziness or lightheadedness.   Assessment  & Plan :   Principal problem Acute blood loss anemia secondary to upper GI bleed EGD showing clean based gastric ulcers (multiple), biopsy sent for H. pylori. Started on Protonix 40 mg twice a day for 1 month followed by once a day. Instructed on avoiding NSAIDs. She was on full dose aspirin which has been discontinued as well. Patient lives in [place] and instructed to follow-up with her PCP over there. Hemoglobin of 7.3 this morning. Needs to be followed as outpatient in 1 week.  Syncope Suspect due to symptomatic blood loss anemia. Was orthostatic in the ED. 2-D echo showed EF of 55-60% with no wall motion abnormality. Transfer to unit PRBC. Concern for seizures on admission as patient had run out of [place] for past 1 week. EEG unremarkable. MRI brain negative for acute findings. Pressure since  for Keppra given.  Alcohol dependence Reports quitting for past 6 weeks. No signs of withdrawal  Remaining medical issues are stable. Stable to be discharged home with outpatient PCP follow-up.  Code Status : Full code  Family Communication  : None at bedside  Disposition Plan  : Home    Consults  :   [facility]  Procedures  :  MRI brain EEG Upper GI endoscopy    Lab Results  Component Value Date   PLT 209 10/20/2015    Antibiotics  :   Anti-infectives    None        Objective:   Filed Vitals:   10/19/15 1619 10/19/15 2017 10/20/15 0541 10/20/15 0748  BP: 130/88 129/78 135/87 127/78  Pulse: 78 83 85 79  Temp: 97.9 F (36.6 C) 98.6 F (37 C) 99.4 F (37.4 C) 99.1 F (37.3 C)  TempSrc: Oral Oral Oral Oral  Resp: 17 16 16 17   Height:      Weight:      SpO2: 100% 100% 96% 97%    Wt Readings from Last 3 Encounters:  10/19/15 55.112 kg (121 lb 8 oz)     Intake/Output Summary (Last 24 hours) at 10/20/15 1230 Last data filed at 10/19/15 1811  Gross per 24 hour  Intake    240 ml  Output      0 ml  Net    240 ml     Physical Exam  Gen: not in distress HEENT: Pallor present, moist mucosa, supple neck Chest: clear b/l, no added sounds CVS: N S1&S2, no murmurs, rubs or gallop GI: soft,  ND, BS+, mild epigastric tenderness Musculoskeletal: warm, no edema CNS: Alert and oriented, nonfocal    Data Review:    CBC  Recent Labs Lab 10/18/15 1330 10/18/15 1410 10/19/15 0220 10/19/15 1207 10/20/15 0631  WBC 5.3  --  4.2  --  5.8  HGB 4.4* 5.1* 7.2* 8.2* 7.3*  HCT 13.9* 15.0* 22.0* 25.9* 22.8*  PLT 269  --  175  --  209  MCV 96.5  --  88.4  --  89.4  MCH 29.2  --  28.9  --  28.6  MCHC 30.2  --  32.7  --  32.0  RDW 14.9  --  16.3*  --  16.4*    Chemistries   Recent Labs Lab 10/18/15 1330 10/18/15 1410 10/20/15 0631  NA 139 142 139  K 3.0* 3.1* 4.0  CL 113* 113* 112*  CO2 18*  --  21*  GLUCOSE 97 93 101*  BUN 7 7 6   CREATININE  0.82 0.80 0.75  CALCIUM 8.8*  --  8.6*  AST 19  --   --   ALT 12*  --   --   ALKPHOS 37*  --   --   BILITOT 0.4  --   --    ------------------------------------------------------------------------------------------------------------------ No results for input(s): CHOL, HDL, LDLCALC, TRIG, CHOLHDL, LDLDIRECT in the last 72 hours.  No results found for: HGBA1C ------------------------------------------------------------------------------------------------------------------ No results for input(s): TSH, T4TOTAL, T3FREE, THYROIDAB in the last 72 hours.  Invalid input(s): FREET3 ------------------------------------------------------------------------------------------------------------------ No results for input(s): VITAMINB12, FOLATE, FERRITIN, TIBC, IRON, RETICCTPCT in the last 72 hours.  Coagulation profile  Recent Labs Lab 10/18/15 1422  INR 1.24    No results for input(s): DDIMER in the last 72 hours.  Cardiac Enzymes No results for input(s): CKMB, TROPONINI, MYOGLOBIN in the last 168 hours.  Invalid input(s): CK ------------------------------------------------------------------------------------------------------------------ No results found for: BNP  Inpatient Medications  Scheduled Meds: . escitalopram  20 mg Oral Daily  . folic acid  1 mg Oral Daily  . levETIRAcetam  750 mg Oral BID  . multivitamin with minerals  1 tablet Oral Daily  . pantoprazole  40 mg Oral BID  . sodium chloride flush  3 mL Intravenous Q12H  . thiamine  100 mg Oral Daily   Or  . thiamine  100 mg Intravenous Daily  Continuous Infusions:  PRN Meds:.acetaminophen **OR** acetaminophen, LORazepam **OR** LORazepam, ondansetron **OR** ondansetron (ZOFRAN) IV  Micro Results Recent Results (from the past 240 hour(s))  Culture, Urine     Status: Abnormal   Collection Time: 10/18/15  6:30 PM  Result Value Ref Range Status   Specimen Description URINE, CLEAN CATCH  Final   Special Requests NONE   Final   Culture MULTIPLE SPECIES PRESENT, SUGGEST RECOLLECTION (A)  Final   Report Status 10/19/2015 FINAL  Final    Radiology Reports Ct Head Wo Contrast  10/18/2015  CLINICAL DATA:  65 year old female with syncope EXAM: CT HEAD WITHOUT CONTRAST TECHNIQUE: Contiguous axial images were obtained from the base of the skull through the vertex without intravenous contrast. COMPARISON:  None. FINDINGS: There is mild age-related atrophy and chronic microvascular ischemic changes. There is a cavum septum pellucidum and cavum vergae. No acute intracranial hemorrhage. No mass effect or midline shift. The visualized paranasal sinuses and mastoid air cells are clear. The calvarium is intact. IMPRESSION: No acute intracranial hemorrhage. Mild age-related atrophy and chronic microvascular ischemic disease. If symptoms persist and there are no contraindications, MRI may provide better evaluation if clinically indicated Electronically Signed   By: Anner Crete M.D.   On: 10/18/2015 14:48   Mr Brain Wo Contrast  10/18/2015  CLINICAL DATA:  Dizziness and weakness for 1 week, multiple syncopal episodes. History of hypertension, alcohol withdrawal seizures, memory loss, closed head injury. EXAM: MRI HEAD WITHOUT CONTRAST TECHNIQUE: Multiplanar, multiecho pulse sequences of the brain and surrounding structures were obtained without intravenous contrast. COMPARISON:  CT HEAD October 18, 2015 at 1439 hours FINDINGS: INTRACRANIAL CONTENTS: No reduced diffusion to suggest acute ischemia. No susceptibility artifact to suggest hemorrhage. Moderate ventriculomegaly on the basis of global parenchymal brain volume loss, cavum septum pellucidum noted. Patchy supratentorial white matter T2 hyperintensities. No suspicious parenchymal signal, masses or mass effect. No abnormal extra-axial fluid collections. No extra-axial masses though, contrast enhanced sequences would be more sensitive. Normal major intracranial vascular flow voids  present at skull base. ORBITS: The included ocular globes and orbital contents are non-suspicious. SINUSES: Trace paranasal sinus mucosal thickening. Small LEFT mastoid effusion. SKULL/SOFT TISSUES: No abnormal sellar expansion. No suspicious calvarial bone marrow signal. Craniocervical junction maintained. Moderate degenerative change of the included cervical spine. IMPRESSION: No acute intracranial process. Moderate global parenchymal brain volume loss and moderate chronic small vessel ischemic disease are advanced for age. Electronically Signed   By: Elon Alas M.D.   On: 10/18/2015 21:55   US Abdomen Complete  10/18/2015  CLINICAL DATA:  Right upper quadrant swelling, status post cholecystectomy EXAM: ABDOMEN ULTRASOUND COMPLETE COMPARISON:  None. FINDINGS: Gallbladder: Surgically absent Common bile duct: Diameter: 3 mm Liver: No focal lesion identified. Within normal limits in parenchymal echogenicity. IVC: No abnormality visualized. Pancreas: Visualized portion unremarkable. Spleen: Size and appearance within normal limits. Right Kidney: Length: 10.4 cm.  No mass or hydronephrosis. Left Kidney: Length: 10.7 cm its.  No mass or hydronephrosis. Abdominal aorta: No aneurysm visualized. Other findings: None. IMPRESSION: Status post cholecystectomy. Otherwise negative abdominal ultrasound. Electronically Signed   By: Julian Hy M.D.   On: 10/18/2015 18:17   Dg Chest Portable 1 View  10/18/2015  CLINICAL DATA:  Syncope EXAM: PORTABLE CHEST 1 VIEW COMPARISON:  None. FINDINGS: There is slight scarring in the left base. Lungs elsewhere clear. Heart size and pulmonary vascularity are normal. No adenopathy. There is an old healed fracture of the lateral right seventh rib. IMPRESSION: Slight scarring  left base.  No edema or consolidation. Electronically Signed   By: Lowella Grip III M.D.   On: 10/18/2015 14:32    Time Spent in minutes  25   Louellen Molder M.D on 10/20/2015 at 12:30  PM  Between 7am to 7pm - Pager - [phone]  After 7pm go to [email or web address] - password [email or web address]  [facility] -  Office  [phone]

## 2015-10-20 NOTE — Discharge Instructions (Signed)

## 2015-10-20 NOTE — Progress Notes (Signed)
PT Cancellation Note  Patient Details Name: Desrae Ising MRN: [id number] DOB: Dec 23, 1950   Cancelled Treatment:    Reason Eval/Treat Not Completed: PT screened, no needs identified, will sign off   On arrival, pt coming out of the shower independently. Discussed any mobility needs with none identified. She has 3 level townhouse and reports she does stairs fine.   Emeree Mahler 10/20/2015, 10:55 AM  Pager [phone]

## 2015-10-20 NOTE — Progress Notes (Signed)
Addison Bailey to be D/C'd Home per MD order.  Discussed prescriptions and follow up appointments with the patient. Prescriptions given to patient, medication list explained in detail. Pt verbalized understanding.    Medication List    STOP taking these medications        aspirin 325 MG tablet      TAKE these medications        atenolol 50 MG tablet  Commonly known as:  TENORMIN  Take 25 mg by mouth daily.     clonazePAM 0.5 MG tablet  Commonly known as:  KLONOPIN  Take 0.5 mg by mouth 2 (two) times daily as needed for anxiety.     escitalopram 20 MG tablet  Commonly known as:  LEXAPRO  Take 20 mg by mouth daily.     folic acid 1 MG tablet  Commonly known as:  FOLVITE  Take 1 mg by mouth daily.     levETIRAcetam 750 MG tablet  Commonly known as:  KEPPRA  Take 1 tablet (750 mg total) by mouth every 12 (twelve) hours.     multivitamin with minerals Tabs tablet  Take 1 tablet by mouth daily.     pantoprazole 40 MG tablet  Commonly known as:  PROTONIX  Take 1 tablet (40 mg total) by mouth 2 (two) times daily. X 30 days, then once daily thereafter        Filed Vitals:   10/20/15 0541 10/20/15 0748  BP: 135/87 127/78  Pulse: 85 79  Temp: 99.4 F (37.4 C) 99.1 F (37.3 C)  Resp: 16 17    Skin clean, dry and intact without evidence of skin break down, no evidence of skin tears noted. IV catheter discontinued intact. Site without signs and symptoms of complications. Dressing and pressure applied. Pt denies pain at this time. No complaints noted.  An After Visit Summary was printed and given to the patient. Patient ambulated off unit, and D/C home via private auto.  Retta Mac BSN, RN

## 2015-10-22 LAB — TYPE AND SCREEN
ABO/RH(D): A POS
Antibody Screen: NEGATIVE
UNIT DIVISION: 0
UNIT DIVISION: 0
UNIT DIVISION: 0
Unit division: 0

## 2015-10-26 ENCOUNTER — Encounter: Payer: Self-pay | Admitting: Gastroenterology

## 2015-11-12 ENCOUNTER — Telehealth: Payer: Self-pay | Admitting: Gastroenterology

## 2015-11-12 NOTE — Telephone Encounter (Signed)
Spoke with pts son and he is not aware of what pt wanted, she uses his phone. He will have pt call back.

## 2015-11-19 ENCOUNTER — Ambulatory Visit (INDEPENDENT_AMBULATORY_CARE_PROVIDER_SITE_OTHER): Payer: No Typology Code available for payment source | Admitting: Neurology

## 2015-11-19 ENCOUNTER — Encounter: Payer: Self-pay | Admitting: Neurology

## 2015-11-19 VITALS — BP 134/87 | HR 72 | Ht 61.0 in | Wt 126.0 lb

## 2015-11-19 DIAGNOSIS — R413 Other amnesia: Secondary | ICD-10-CM | POA: Diagnosis not present

## 2015-11-19 DIAGNOSIS — R569 Unspecified convulsions: Secondary | ICD-10-CM | POA: Diagnosis not present

## 2015-11-19 DIAGNOSIS — D62 Acute posthemorrhagic anemia: Secondary | ICD-10-CM | POA: Diagnosis not present

## 2015-11-19 HISTORY — DX: Unspecified convulsions: R56.9

## 2015-11-19 HISTORY — DX: Other amnesia: R41.3

## 2015-11-19 MED ORDER — LEVETIRACETAM 750 MG PO TABS: 750.0000 mg | ORAL_TABLET | Freq: Two times a day (BID) | ORAL | Status: DC

## 2015-11-19 MED ORDER — MEMANTINE HCL 28 X 5 MG & 21 X 10 MG PO TABS: ORAL_TABLET | ORAL | Status: DC

## 2015-11-19 NOTE — Progress Notes (Signed)
Reason for visit: Memory disturbance  Referring physician: Dr. Jolene Provost is a 65 y.o. female  History of present illness:  Ms. Dorwart is a 65 year old right-handed white female with a history of alcohol abuse. She is felt to have alcohol withdrawal seizures initially, but she has continued to have generalized seizures even without drinking. The patient is being treated with Keppra at this time. The patient recently was in the hospital on 10/18/2015 with severe anemia with a hemoglobin of 4.4. The patient required transfusions, she was found to have gastric ulcers, she currently is on Protonix. The patient had MRI evaluation of the brain in the hospital that showed a moderate level small vessel disease and moderate cortical atrophy. No acute changes were seen. An EEG study in the hospital was normal. The patient is on Keppra taking 750 mg twice daily. She has never been completely controlled with her seizures, but she also has had significant issues with medical noncompliance. The patient is now with her son who is overseeing her medication. She does not operate a motor vehicle. The son indicates that she has had significant issues with memory, this dates back about 5 years and has gradually worsened over time. According to the family, the memory seems to worsen significantly after seizures, the last seizure 2 or 3 months ago was more prolonged, the patient was not oriented for about 48 hours following the seizure, and her memory has never been the same since. The patient has reported episodes of hypoglycemia as well. The patient requires assistance with keeping up with medications and appointments, and with doing the finances. The patient has difficulty with short-term memory, and she will repeat herself frequently. She reports no numbness or weakness on the face, arms, or legs. She denies any significant balance issues. She is sent to this office for an evaluation. She has not consumed  alcohol in about 2 or 3 months. The seizures appear to be generalized tonic-clonic events oftentimes coming out of sleep, associated with moaning, and with stiffening and jerking, and urinary incontinence.  Past Medical History  Diagnosis Date  . Hypoglycemia   . Depression   . H/O ETOH abuse     "has not had anything to 2 months" (10/2015)  . Delirium, withdrawal, alcoholic ([place]) 0000000  . Closed head injury 2015; early 2017    "fell in my house"  . Memory loss   . History of esophagogastroduodenoscopy (EGD) 07/2013    erosive gastritis, no varices ([place])  . Anxiety   . Hypothyroidism   . Anemia   . History of blood transfusion 10/18/2015    "anemia"  . Alcohol related seizure ([place])   . Stroke [facility]) 2007    "dr's weren't sure if it was a stroke or seizure" (10/19/2015)  . Seizures ([place]) 11/19/2015  . Memory difficulty 11/19/2015    Past Surgical History  Procedure Laterality Date  . Tonsillectomy    . Cholecystectomy open    . Esophagogastroduodenoscopy N/A 10/19/2015    Procedure: ESOPHAGOGASTRODUODENOSCOPY (EGD);  Surgeon: Manus Gunning, MD;  Location: [facility];  Service: Gastroenterology;  Laterality: N/A;    Family History  Problem Relation Age of Onset  . Other Mother     Hypoglycemia  . Cancer Father   . Cancer Paternal Grandfather   . Seizures Neg Hx     Social history:  reports that she has never smoked. She has never used smokeless tobacco. She reports that she does not drink alcohol or  use illicit drugs.  Medications:  Prior to Admission medications   Medication Sig Start Date End Date Taking? Authorizing Provider  levETIRAcetam (KEPPRA) 750 MG tablet Take 1 tablet (750 mg total) by mouth every 12 (twelve) hours. 10/19/15  Yes Orson Eva, MD  pantoprazole (PROTONIX) 40 MG tablet Take 1 tablet (40 mg total) by mouth 2 (two) times daily. X 30 days, then once daily thereafter 10/19/15  Yes Orson Eva, MD  memantine [facility] TITRATION PAK) tablet pack 5  mg/day for =1 week; 5 mg twice daily for =1 week; 15 mg/day given in 5 mg and 10 mg separated doses for =1 week; then 10 mg twice daily 11/19/15   Kathrynn Ducking, MD      Allergies  Allergen Reactions  . Sertraline     Other reaction(s): psychological reaction Causes hallucinations/paranoid thoughts    ROS:  Out of a complete 14 system review of symptoms, the patient complains only of the following symptoms, and all other reviewed systems are negative.  Fevers, chills, fatigue Blurred vision Memory loss, confusion, headache, seizures Depression, anxiety Allergies  Blood pressure 134/87, pulse 72, height 5\' 1"  (1.549 m), weight 126 lb (57.153 kg).  Physical Exam  General: The patient is alert and cooperative at the time of the examination.  Eyes: Pupils are equal, round, and reactive to light. Discs are flat bilaterally.  Neck: The neck is supple, no carotid bruits are noted.  Respiratory: The respiratory examination is clear.  Cardiovascular: The cardiovascular examination reveals a regular rate and rhythm, no obvious murmurs or rubs are noted.  Skin: Extremities are without significant edema.  Neurologic Exam  Mental status: The patient is alert and oriented x 2 at the time of the examination (Not oriented to date). The Mini-Mental Status Examination done today shows a total score 26/30.  Cranial nerves: Facial symmetry is present. There is good sensation of the face to pinprick and soft touch bilaterally. The strength of the facial muscles and the muscles to head turning and shoulder shrug are normal bilaterally. Speech is well enunciated, no aphasia or dysarthria is noted. Extraocular movements are full. Visual fields are full. The tongue is midline, and the patient has symmetric elevation of the soft palate. No obvious hearing deficits are noted.  Motor: The motor testing reveals 5 over 5 strength of all 4 extremities. Good symmetric motor tone is noted  throughout.  Sensory: Sensory testing is intact to pinprick, soft touch, vibration sensation, and position sense on all 4 extremities, with exception of a stocking pattern pinprick sensory deficit two thirds way up the legs bilaterally. No evidence of extinction is noted.  Coordination: Cerebellar testing reveals good finger-nose-finger and heel-to-shin bilaterally.  Gait and station: Gait is normal. Tandem gait is normal. Romberg is negative. No drift is seen.  Reflexes: Deep tendon reflexes are symmetric and normal bilaterally. Toes are downgoing bilaterally.   MRI brain 10/18/15:  IMPRESSION: No acute intracranial process.  Moderate global parenchymal brain volume loss and moderate chronic small vessel ischemic disease are advanced for age.  * MRI scan images were reviewed online. I agree with the written report.    Assessment/Plan:  1. Memory disturbance  2. Generalized seizure disorder  3. History of alcohol abuse  4. Recent blood loss anemia, severe  The patient has had a recent MRI of the brain and an EEG study. The patient will be sent for blood work today. A prescription was called in for Keppra. We will start Namenda for the  memory, they will contact our office after one month if she is tolerating the medication to get the maintenance prescription. The patient will follow-up in 6 months for an evaluation. The patient is not to operate a motor vehicle.  Jill Alexanders MD 11/19/2015 3:34 PM  [facility] [address] [place], [place] [address]  Phone [phone] Fax [phone]

## 2015-11-19 NOTE — Patient Instructions (Signed)

## 2015-11-23 LAB — CBC WITH DIFFERENTIAL/PLATELET
BASOS ABS: 0 10*3/uL (ref 0.0–0.2)
Basos: 1 %
EOS (ABSOLUTE): 0.2 10*3/uL (ref 0.0–0.4)
Eos: 3 %
Hematocrit: 28.2 % — ABNORMAL LOW (ref 34.0–46.6)
Hemoglobin: 8.6 g/dL — ABNORMAL LOW (ref 11.1–15.9)
IMMATURE GRANULOCYTES: 0 %
Immature Grans (Abs): 0 10*3/uL (ref 0.0–0.1)
Lymphocytes Absolute: 1.3 10*3/uL (ref 0.7–3.1)
Lymphs: 24 %
MCH: 25.9 pg — ABNORMAL LOW (ref 26.6–33.0)
MCHC: 30.5 g/dL — AB (ref 31.5–35.7)
MCV: 85 fL (ref 79–97)
MONOS ABS: 0.3 10*3/uL (ref 0.1–0.9)
Monocytes: 6 %
NEUTROS PCT: 66 %
Neutrophils Absolute: 3.4 10*3/uL (ref 1.4–7.0)
PLATELETS: 259 10*3/uL (ref 150–379)
RBC: 3.32 x10E6/uL — AB (ref 3.77–5.28)
RDW: 16.8 % — AB (ref 12.3–15.4)
WBC: 5.2 10*3/uL (ref 3.4–10.8)

## 2015-11-23 LAB — SEDIMENTATION RATE: Sed Rate: 10 mm/hr (ref 0–40)

## 2015-11-23 LAB — HIV ANTIBODY (ROUTINE TESTING W REFLEX): HIV SCREEN 4TH GENERATION: NONREACTIVE

## 2015-11-23 LAB — VITAMIN B12: VITAMIN B 12: 564 pg/mL (ref 211–946)

## 2015-11-23 LAB — RPR: RPR Ser Ql: NONREACTIVE

## 2015-11-23 LAB — METHYLMALONIC ACID, SERUM: Methylmalonic Acid: 336 nmol/L (ref 0–378)

## 2015-11-23 LAB — VITAMIN B1: Thiamine: 125.5 nmol/L (ref 66.5–200.0)

## 2015-11-24 ENCOUNTER — Telehealth: Payer: Self-pay

## 2015-11-24 NOTE — Telephone Encounter (Signed)
Called w/ stable lab results. Verbalized understanding and appreciation for call.

## 2015-11-24 NOTE — Telephone Encounter (Signed)
-----   Message from Kathrynn Ducking, MD sent at 11/23/2015  3:34 PM EDT -----  The blood work results are unremarkable, with exception of a mild stable anemia.. Please call the patient.  ----- Message -----    From: [facility] Lab Results In Interface    Sent: 11/20/2015   7:03 PM      To: Kathrynn Ducking, MD

## 2015-11-26 ENCOUNTER — Telehealth: Payer: Self-pay | Admitting: Neurology

## 2015-11-26 NOTE — Telephone Encounter (Signed)
[facility] called to get PA on memantine (NAMENDA TITRATION PAK) tablet pack. Please call [phone]

## 2015-11-26 NOTE — Telephone Encounter (Signed)
I ordered generic Namenda, okay to use. I called the pharmacy, the generic medication does require a prior authorization.

## 2015-11-29 ENCOUNTER — Telehealth: Payer: Self-pay | Admitting: *Deleted

## 2015-11-29 NOTE — Telephone Encounter (Signed)
Namenda XR approved by [facility] through 11/28/2018 - pt [id number] - PA#GEHA [employer] [id number] SS.

## 2015-11-29 NOTE — Telephone Encounter (Signed)
PA completed and approved. KeyBasilio [place] - PA Case [id number].

## 2015-12-08 ENCOUNTER — Ambulatory Visit: Payer: No Typology Code available for payment source | Admitting: Neurology

## 2016-03-07 ENCOUNTER — Other Ambulatory Visit: Payer: Self-pay | Admitting: Neurology

## 2016-03-27 ENCOUNTER — Other Ambulatory Visit: Payer: Self-pay | Admitting: Neurology

## 2016-04-18 ENCOUNTER — Other Ambulatory Visit: Payer: Self-pay | Admitting: Neurology

## 2016-05-22 ENCOUNTER — Ambulatory Visit: Payer: PRIVATE HEALTH INSURANCE | Admitting: Adult Health

## 2016-05-23 ENCOUNTER — Encounter: Payer: Self-pay | Admitting: Adult Health

## 2017-04-25 ENCOUNTER — Emergency Department (HOSPITAL_COMMUNITY): Payer: Medicare Other

## 2017-04-25 ENCOUNTER — Encounter (HOSPITAL_COMMUNITY): Payer: Self-pay

## 2017-04-25 ENCOUNTER — Emergency Department (HOSPITAL_COMMUNITY)
Admission: EM | Admit: 2017-04-25 | Discharge: 2017-04-25 | Disposition: A | Discharge status: Home / Self Care | Payer: Medicare Other | Attending: Emergency Medicine | Admitting: Emergency Medicine

## 2017-04-25 DIAGNOSIS — E039 Hypothyroidism, unspecified: Secondary | ICD-10-CM | POA: Diagnosis not present

## 2017-04-25 DIAGNOSIS — Z79899 Other long term (current) drug therapy: Secondary | ICD-10-CM | POA: Insufficient documentation

## 2017-04-25 DIAGNOSIS — R569 Unspecified convulsions: Secondary | ICD-10-CM | POA: Diagnosis not present

## 2017-04-25 DIAGNOSIS — Z8673 Personal history of transient ischemic attack (TIA), and cerebral infarction without residual deficits: Secondary | ICD-10-CM | POA: Insufficient documentation

## 2017-04-25 LAB — CBC WITH DIFFERENTIAL/PLATELET
BASOS ABS: 0 10*3/uL (ref 0.0–0.1)
Basophils Relative: 0 %
Eosinophils Absolute: 0 10*3/uL (ref 0.0–0.7)
Eosinophils Relative: 0 %
HEMATOCRIT: 40.7 % (ref 36.0–46.0)
Hemoglobin: 13.7 g/dL (ref 12.0–15.0)
LYMPHS ABS: 0.6 10*3/uL — AB (ref 0.7–4.0)
LYMPHS PCT: 6 %
MCH: 32.5 pg (ref 26.0–34.0)
MCHC: 33.7 g/dL (ref 30.0–36.0)
MCV: 96.7 fL (ref 78.0–100.0)
MONO ABS: 0.6 10*3/uL (ref 0.1–1.0)
MONOS PCT: 6 %
NEUTROS ABS: 8.2 10*3/uL — AB (ref 1.7–7.7)
Neutrophils Relative %: 88 %
Platelets: 252 10*3/uL (ref 150–400)
RBC: 4.21 MIL/uL (ref 3.87–5.11)
RDW: 13.9 % (ref 11.5–15.5)
WBC: 9.3 10*3/uL (ref 4.0–10.5)

## 2017-04-25 LAB — COMPREHENSIVE METABOLIC PANEL
ALT: 21 U/L (ref 14–54)
AST: 29 U/L (ref 15–41)
Albumin: 4.5 g/dL (ref 3.5–5.0)
Alkaline Phosphatase: 51 U/L (ref 38–126)
Anion gap: 10 (ref 5–15)
BILIRUBIN TOTAL: 0.5 mg/dL (ref 0.3–1.2)
CO2: 24 mmol/L (ref 22–32)
CREATININE: 0.8 mg/dL (ref 0.44–1.00)
Calcium: 9.7 mg/dL (ref 8.9–10.3)
Chloride: 104 mmol/L (ref 101–111)
GFR calc Af Amer: >60 mL/min (ref >60)
Glucose, Bld: 98 mg/dL (ref 65–99)
POTASSIUM: 3.9 mmol/L (ref 3.5–5.1)
Sodium: 138 mmol/L (ref 135–145)
TOTAL PROTEIN: 8.5 g/dL — AB (ref 6.5–8.1)

## 2017-04-25 LAB — URINALYSIS, ROUTINE W REFLEX MICROSCOPIC
Bacteria, UA: NONE SEEN
Bilirubin Urine: NEGATIVE
Glucose, UA: NEGATIVE mg/dL
Ketones, ur: NEGATIVE mg/dL
Leukocytes, UA: NEGATIVE
Nitrite: NEGATIVE
PH: 7 (ref 5.0–8.0)
Protein, ur: NEGATIVE mg/dL
SPECIFIC GRAVITY, URINE: 1.009 (ref 1.005–1.030)
Squamous Epithelial / LPF: NONE SEEN

## 2017-04-25 LAB — RAPID URINE DRUG SCREEN, HOSP PERFORMED
Amphetamines: NOT DETECTED
BARBITURATES: NOT DETECTED
BENZODIAZEPINES: NOT DETECTED
COCAINE: NOT DETECTED
Opiates: NOT DETECTED
TETRAHYDROCANNABINOL: NOT DETECTED

## 2017-04-25 LAB — ETHANOL

## 2017-04-25 MED ORDER — SODIUM CHLORIDE 0.9 % IV SOLN
1000.0000 mg | Freq: Once | INTRAVENOUS | Status: AC
  Administered 2017-04-25: 1000 mg via INTRAVENOUS
  Filled 2017-04-25: qty 10

## 2017-04-25 MED ORDER — ACETAMINOPHEN 500 MG PO TABS
1000.0000 mg | ORAL_TABLET | Freq: Once | ORAL | Status: AC
  Administered 2017-04-25: 1000 mg via ORAL
  Filled 2017-04-25: qty 2

## 2017-04-25 MED ORDER — LEVETIRACETAM 750 MG PO TABS: 750.0000 mg | ORAL_TABLET | Freq: Two times a day (BID) | ORAL | 0 refills | Status: DC

## 2017-04-25 NOTE — Discharge Instructions (Signed)
Continue Keppra at current dose.

## 2017-04-25 NOTE — ED Notes (Signed)
Bed: RESA Expected date: 04/25/17 Expected time: 8:10 AM Means of arrival: Ambulance Comments: EMS-seizure

## 2017-04-25 NOTE — Progress Notes (Addendum)
Consult request has been received. CSW attempting to follow up at present time.  3:07 PM  CSW called pt's son Cameron Katayama again and is reaching a busy signal.  CSW attempting to locate number for pt's other son Graziella Connery.  3:20 PM CSW attempted to call pt's RN and EDP, but both are participating in a procedure.  CSW will attempt again.  6:38 PM CSW called pt's son again and received a busy signal.   7:04 PM CSW spoke to pt and verified pt's son's address is the address listed on pt's facesheet and CSW called non-emergency dispatch and requested a "Wellness check" on the pt's son's home due to the pt's son having refused to answer calls or pick up his mother despite pt's son being aware pt is ready for D/C.  CSW requested a call back from [employer] advising the CSW as to whether pt's son was located and to request pt's son call CSW or pick up the pt.  CSW asked pt questions and pt did not kow what city she was in stating only that she was in [place].  Pt was aware she "may have had a seizure", but did not know her address citing two different addresses when asked.  When asked initially pt did say she lived at [address] [place] [address], but later recanted while attempting to provide another street name and could not remember in the end.   CSW awaiting return call from [facility].  7:29 PM CSW received a call from [facility] and clarified what CSW is requesting as [employer] is en route to pt's son's apartment.  Of note: [employer] stated the pt's son is well-known to [employer] due to what [employer] stated was the pt's son's "severe mental issues" saying the pt's son has periods where he has been observed as "having problems" due to mental issues.  CSW noted this and requested [employer] seek pt's son's brother's phone and address if possible.  [employer] stated he will attempt this.  CSW awaiting return call from [facility].  7:38 PM CSW received call back from [employer] stating pt's son stated he "was waiting on a social worker to send the pt  to his house".  CSW stated to [facility] ED cannot send pt to his home and he must come pick up his mother now.  [employer] stated pt's son now states he is waiting on a "different social worker tk check on his family before he comes but will come later to pick up his mother and take her to a hotel".  CSW asked [employer] request pt's son to answer the phone when [facility] ED/CSW calls.  CSW will update EDP/RN.  8:01 PM CSW called son's phone (s) and the phone lines were busy.   Alphonse Guild. Darleny Sem, LCSW, LCAS, CSI Clinical Social Worker Ph: [phone]

## 2017-04-25 NOTE — Progress Notes (Signed)
CSW consulted with EDP and EDP was meeting with the pt.  Pt's son [name] arrived at the ED at that time to pick up the pt and is at bedside now.  Please reconsult if future social work needs arise.  CSW signing off, as social work intervention is no longer needed.  Alphonse Guild. Abriel Geesey, LCSW, LCAS, CSI Clinical Social Worker Ph: [phone]

## 2017-04-25 NOTE — ED Triage Notes (Signed)
Patient arrived via [facility] from the train station waiting to go back to [place] Patient had a seizure that lasted about a full minute. Bystanders were able to help patient down to a bench. Patient has been agitated and uncooperative. Patient has not been able to answer any questions correctly. Patient thinks she may have had a seizure in the past but does not know. Possible psychiatric hx. Patient has a son fire department called, son rushed to get off the phone to go to work.

## 2017-04-25 NOTE — ED Notes (Signed)
Patient has a scrape on left knee. Patient also complains of right hip pain 5/10.

## 2017-04-25 NOTE — Care Management Note (Signed)
Case Management Note  CM consulted for issues with getting in touch with pt's family for pick up.  Advised to consult CSW.  CM will be available if further needs arise.

## 2017-04-25 NOTE — ED Notes (Signed)
Patient pulled out her on IV. Catheter intact.

## 2017-04-25 NOTE — ED Provider Notes (Signed)
[facility] DEPT Provider Note   CSN: [id number] Arrival date & time: 04/25/17  8916     History   Chief Complaint Chief Complaint  Patient presents with  . Seizures    HPI Jillian Harmon is a 66 y.o. female. CC:  Seizure  HPI: 66 year old female. Previous history of alcoholism. History of seizures. Per her chart taking Keppra.  Per EMS the patient was at the train station. Apparently she has traveled to see her son in [place] and is traveling back to her home in [place]. While change stations morning had a witnessed seizure. With sitting. Was helped to the floor by passerby's. Did not injure herself. Was agitated for EMS but has improved en route. She prefers to sleep but awakens to voice now. Remains confused.  Past Medical History:  Diagnosis Date  . Alcohol related seizure ([place])   . Anemia   . Anxiety   . Closed head injury 2015; early 2017   "fell in my house"  . Delirium, withdrawal, alcoholic ([place]) 9450  . Depression   . H/O ETOH abuse    "has not had anything to 2 months" (10/2015)  . History of blood transfusion 10/18/2015   "anemia"  . 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 Hypoglycemia   . Hypothyroidism   . Memory difficulty 11/19/2015  . Memory loss   . Seizures ([place]) 11/19/2015  . Stroke [facility]) 2007   "dr's weren't sure if it was a stroke or seizure" (10/19/2015)    Patient Active Problem List   Diagnosis Date Noted  . Seizures ([place]) 11/19/2015  . Memory difficulty 11/19/2015  . Alcohol withdrawal seizure ([place]) 10/20/2015  . Syncope and collapse   . Faintness   . Gait instability   . Gastrointestinal hemorrhage associated with gastric ulcer   . Acute blood loss anemia 10/18/2015  . Syncope and collapse 10/18/2015  . Alcohol dependence ([place]) 10/18/2015  . Upper GI bleed 10/18/2015  . Bleeding gastrointestinal   . Symptomatic anemia     Past Surgical  History:  Procedure Laterality Date  . CHOLECYSTECTOMY OPEN    . ESOPHAGOGASTRODUODENOSCOPY N/A 10/19/2015   Procedure: ESOPHAGOGASTRODUODENOSCOPY (EGD);  Surgeon: Manus Gunning, MD;  Location: [facility];  Service: Gastroenterology;  Laterality: N/A;  . TONSILLECTOMY      OB History    No data available       Home Medications    Prior to Admission medications   Medication Sig Start Date End Date Taking? Authorizing Provider  escitalopram (LEXAPRO) 10 MG tablet Take 10 mg by mouth daily. 03/02/17  Yes [provider]  folic acid (FOLVITE) 1 MG tablet Take 1 mg by mouth daily. 01/26/17  Yes [provider]  pantoprazole (PROTONIX) 40 MG tablet Take 1 tablet (40 mg total) by mouth 2 (two) times daily. X 30 days, then once daily thereafter Patient taking differently: Take 40 mg by mouth daily. X 30 days, then once daily thereafter 10/19/15  Yes Tat, Shanon Brow, MD  Cholecalciferol (VITAMIN D3) 2000 units capsule Take 2,000 Units by mouth daily.    [provider]  levETIRAcetam (KEPPRA) 750 MG tablet Take 1 tablet (750 mg total) by mouth 2 (two) times daily. 04/25/17   Tanna Furry, MD  memantine [facility] TITRATION PAK) tablet pack 5 mg/day for =1 week; 5 mg twice daily for =1 week; 15 mg/day given in 5 mg and 10 mg separated doses for =1 week; then 10 mg  twice daily 11/19/15   Kathrynn Ducking, MD  thiamine 100 MG tablet Take 100 mg by mouth daily. 07/10/16   [provider]  vitamin B-12 (CYANOCOBALAMIN) 500 MCG tablet Take 500 mcg by mouth daily.    [provider]    Family History Family History  Problem Relation Age of Onset  . Other Mother        Hypoglycemia  . Cancer Father   . Cancer Paternal Grandfather   . Seizures Neg Hx     Social History Social History   Tobacco Use  . Smoking status: Never Smoker  . Smokeless tobacco: Never Used  Substance Use Topics  . Alcohol use: No    Alcohol/week: 0.0 oz    Comment:  Previous alcohol use, now none  . Drug use: No     Allergies   Sertraline   Review of Systems Review of Systems  Unable to perform ROS: Mental status change  Level V Caveat Post ictal.    Physical Exam Updated Vital Signs BP 111/61   Pulse 81   Temp 99 F (37.2 C) (Oral)   Resp 11   SpO2 98%   Physical Exam  Constitutional: She appears well-developed and well-nourished. No distress.  HENT:  Head: Normocephalic.  Eyes: Conjunctivae are normal. Pupils are equal, round, and reactive to light. No scleral icterus.  Neck: Normal range of motion. Neck supple. No thyromegaly present.  Cardiovascular: Normal rate and regular rhythm. Exam reveals no gallop and no friction rub.  No murmur heard. Pulmonary/Chest: Effort normal and breath sounds normal. No respiratory distress. She has no wheezes. She has no rales.  Abdominal: Soft. Bowel sounds are normal. She exhibits no distension. There is no tenderness. There is no rebound.  Musculoskeletal: Normal range of motion.  Neurological: She is alert.  Awake. Disoriented to place and time. Oriented to self. No asymmetry to her neurological exam. Moving all 4 actually.  Skin: Skin is warm and dry. No rash noted.  Psychiatric: She has a normal mood and affect. Her behavior is normal.     ED Treatments / Results  Labs (all labs ordered are listed, but only abnormal results are displayed) Labs Reviewed  CBC WITH DIFFERENTIAL/PLATELET - Abnormal; Notable for the following components:      Result Value   Neutro Abs 8.2 (*)    Lymphs Abs 0.6 (*)    All other components within normal limits  COMPREHENSIVE METABOLIC PANEL - Abnormal; Notable for the following components:   BUN <5 (*)    Total Protein 8.5 (*)    All other components within normal limits  URINALYSIS, ROUTINE W REFLEX MICROSCOPIC - Abnormal; Notable for the following components:   Hgb urine dipstick SMALL (*)    All other components within normal limits  ETHANOL    RAPID URINE DRUG SCREEN, HOSP PERFORMED    EKG  EKG Interpretation None       Radiology Ct Head Wo Contrast  Result Date: 04/25/2017 CLINICAL DATA:  Seizure today. Left-sided headache. Generalized weakness. EXAM: CT HEAD WITHOUT CONTRAST TECHNIQUE: Contiguous axial images were obtained from the base of the skull through the vertex without intravenous contrast. COMPARISON:  10/18/2015 FINDINGS: Brain: Generalized atrophy. Chronic small-vessel ischemic changes throughout the white matter. No sign of acute infarction, mass lesion, hemorrhage, hydrocephalus or extra-axial collection. Vascular: There is atherosclerotic calcification of the major vessels at the base of the brain. Skull: No skull fracture. Sinuses/Orbits: Clear/normal Other: Mild left posterior parietal scalp swelling.  IMPRESSION: No acute finding. Atrophy and chronic small-vessel ischemic changes of the cerebral hemispheric white matter. Small left parietal scalp hematoma. Electronically Signed   By: Nelson Chimes M.D.   On: 04/25/2017 09:56   Dg Knee Complete 4 Views Left  Result Date: 04/25/2017 CLINICAL DATA:  Left knee abrasions following a seizure today. EXAM: LEFT KNEE - COMPLETE 4+ VIEW COMPARISON:  None in PACs FINDINGS: The bones are subjectively adequately mineralized. There is no acute fracture nor dislocation. The joint spaces are well maintained. No joint effusion is observed. IMPRESSION: There is no acute or significant chronic bony abnormality of the left knee. Electronically Signed   By: David  Martinique M.D.   On: 04/25/2017 11:37   Dg Hip Unilat W Or Wo Pelvis 2-3 Views Right  Result Date: 04/25/2017 CLINICAL DATA:  Onset of seizure activity day with subsequent right hip pain. EXAM: DG HIP (WITH OR WITHOUT PELVIS) 2-3V RIGHT COMPARISON:  None in PACs FINDINGS: The bony pelvis is subjectively adequately mineralized. There is no lytic or blastic lesion nor acute fracture. AP and lateral views of the right hip reveal  preservation of the joint space. The articular surfaces of the femoral head and acetabulum remains smoothly rounded. The femoral neck, intertrochanteric, and subtrochanteric regions are normal. IMPRESSION: There is no acute bony abnormality of the right hip. Electronically Signed   By: David  Martinique M.D.   On: 04/25/2017 11:36    Procedures Procedures (including critical care time)  Medications Ordered in ED Medications  levETIRAcetam (KEPPRA) 1,000 mg in sodium chloride 0.9 % 100 mL IVPB (0 mg Intravenous Stopped 04/25/17 1052)  acetaminophen (TYLENOL) tablet 1,000 mg (1,000 mg Oral Given 04/25/17 1435)     Initial Impression / Assessment and Plan / ED Course  I have reviewed the triage vital signs and the nursing notes.  Pertinent labs & imaging results that were available during my care of the patient were reviewed by me and considered in my medical decision making (see chart for details).   plan IV Keppra. We'll CT. Metabolic labs. Ethanol toxicology. We'll observe for improvement of her postictal period. We'll contact family as available and possible.  Final Clinical Impressions(s) / ED Diagnoses   Final diagnoses:  Seizure ([place])   Patient's mental status improved. She has spoken with her son over the phone. They're somewhat argumentative with each other. I spoke with her son in [place]. He is contact her son locally. Her son in [place] assured me that his brother is en route to pick their mother up. We're awaiting arrival of family to take her home she is appropriate for discharge. In review of her chart, she has a neurology note from a local neurologist describing prolonged confusion after seizures. Her mental status has improved she is oriented to person place and time. However, easily agitated with phone conversations with family.   ED Discharge Orders        Ordered    levETIRAcetam (KEPPRA) 750 MG tablet  2 times daily     04/25/17 1737       Tanna Furry, MD 04/26/17  [phone]

## 2017-04-26 ENCOUNTER — Emergency Department (HOSPITAL_COMMUNITY)
Admission: EM | Admit: 2017-04-26 | Discharge: 2017-04-30 | Disposition: A | Discharge status: Home / Self Care | Payer: Medicare Other | Attending: Emergency Medicine | Admitting: Emergency Medicine

## 2017-04-26 ENCOUNTER — Encounter (HOSPITAL_COMMUNITY): Payer: Self-pay | Admitting: Emergency Medicine

## 2017-04-26 DIAGNOSIS — R410 Disorientation, unspecified: Secondary | ICD-10-CM | POA: Insufficient documentation

## 2017-04-26 DIAGNOSIS — Z8673 Personal history of transient ischemic attack (TIA), and cerebral infarction without residual deficits: Secondary | ICD-10-CM | POA: Diagnosis not present

## 2017-04-26 DIAGNOSIS — E039 Hypothyroidism, unspecified: Secondary | ICD-10-CM | POA: Insufficient documentation

## 2017-04-26 DIAGNOSIS — X31XXXA Exposure to excessive natural cold, initial encounter: Secondary | ICD-10-CM | POA: Diagnosis not present

## 2017-04-26 DIAGNOSIS — F332 Major depressive disorder, recurrent severe without psychotic features: Secondary | ICD-10-CM | POA: Insufficient documentation

## 2017-04-26 DIAGNOSIS — Z79899 Other long term (current) drug therapy: Secondary | ICD-10-CM | POA: Diagnosis not present

## 2017-04-26 LAB — CBC
HEMATOCRIT: 41.5 % (ref 36.0–46.0)
HEMOGLOBIN: 14.2 g/dL (ref 12.0–15.0)
MCH: 32.9 pg (ref 26.0–34.0)
MCHC: 34.2 g/dL (ref 30.0–36.0)
MCV: 96.1 fL (ref 78.0–100.0)
Platelets: 237 10*3/uL (ref 150–400)
RBC: 4.32 MIL/uL (ref 3.87–5.11)
RDW: 13.6 % (ref 11.5–15.5)
WBC: 11.7 10*3/uL — ABNORMAL HIGH (ref 4.0–10.5)

## 2017-04-26 LAB — COMPREHENSIVE METABOLIC PANEL
ALBUMIN: 4.6 g/dL (ref 3.5–5.0)
ALK PHOS: 50 U/L (ref 38–126)
ALT: 22 U/L (ref 14–54)
ANION GAP: 15 (ref 5–15)
AST: 37 U/L (ref 15–41)
BILIRUBIN TOTAL: 1 mg/dL (ref 0.3–1.2)
BUN: 10 mg/dL (ref 6–20)
CALCIUM: 9.7 mg/dL (ref 8.9–10.3)
CO2: 19 mmol/L — ABNORMAL LOW (ref 22–32)
Chloride: 105 mmol/L (ref 101–111)
Creatinine, Ser: 1.34 mg/dL — ABNORMAL HIGH (ref 0.44–1.00)
GFR, EST AFRICAN AMERICAN: 47 mL/min — AB (ref >60)
GFR, EST NON AFRICAN AMERICAN: 41 mL/min — AB (ref >60)
Glucose, Bld: 85 mg/dL (ref 65–99)
POTASSIUM: 3.6 mmol/L (ref 3.5–5.1)
Sodium: 139 mmol/L (ref 135–145)
TOTAL PROTEIN: 8.4 g/dL — AB (ref 6.5–8.1)

## 2017-04-26 LAB — URINALYSIS, ROUTINE W REFLEX MICROSCOPIC
BILIRUBIN URINE: NEGATIVE
Glucose, UA: NEGATIVE mg/dL
KETONES UR: 40 mg/dL — AB
LEUKOCYTES UA: NEGATIVE
NITRITE: NEGATIVE
PROTEIN: NEGATIVE mg/dL
Specific Gravity, Urine: 1.015 (ref 1.005–1.030)
pH: 6 (ref 5.0–8.0)

## 2017-04-26 LAB — RAPID URINE DRUG SCREEN, HOSP PERFORMED
Amphetamines: NOT DETECTED
BARBITURATES: NOT DETECTED
BENZODIAZEPINES: NOT DETECTED
COCAINE: NOT DETECTED
Opiates: NOT DETECTED
Tetrahydrocannabinol: NOT DETECTED

## 2017-04-26 LAB — ETHANOL

## 2017-04-26 LAB — URINALYSIS, MICROSCOPIC (REFLEX): SQUAMOUS EPITHELIAL / LPF: NONE SEEN

## 2017-04-26 LAB — CBG MONITORING, ED: GLUCOSE-CAPILLARY: 77 mg/dL (ref 65–99)

## 2017-04-26 MED ORDER — ACETAMINOPHEN 325 MG PO TABS
650.0000 mg | ORAL_TABLET | ORAL | Status: DC | PRN
  Administered 2017-04-27: 650 mg via ORAL
  Filled 2017-04-26: qty 2

## 2017-04-26 MED ORDER — CYANOCOBALAMIN 500 MCG PO TABS
500.0000 ug | ORAL_TABLET | Freq: Every day | ORAL | Status: DC
  Administered 2017-04-27 – 2017-04-30 (×4): 500 ug via ORAL
  Filled 2017-04-26 (×7): qty 1

## 2017-04-26 MED ORDER — LEVETIRACETAM 750 MG PO TABS
750.0000 mg | ORAL_TABLET | Freq: Two times a day (BID) | ORAL | Status: DC
  Administered 2017-04-26 – 2017-04-27 (×2): 750 mg via ORAL
  Filled 2017-04-26 (×2): qty 1

## 2017-04-26 MED ORDER — SODIUM CHLORIDE 0.9 % IV BOLUS (SEPSIS)
1000.0000 mL | Freq: Once | INTRAVENOUS | Status: AC
  Administered 2017-04-26: 1000 mL via INTRAVENOUS

## 2017-04-26 MED ORDER — FOLIC ACID 1 MG PO TABS
1.0000 mg | ORAL_TABLET | Freq: Every day | ORAL | Status: DC
  Administered 2017-04-26 – 2017-04-30 (×5): 1 mg via ORAL
  Filled 2017-04-26 (×5): qty 1

## 2017-04-26 MED ORDER — PANTOPRAZOLE SODIUM 40 MG PO TBEC
40.0000 mg | DELAYED_RELEASE_TABLET | Freq: Every day | ORAL | Status: DC
  Administered 2017-04-26 – 2017-04-30 (×5): 40 mg via ORAL
  Filled 2017-04-26 (×5): qty 1

## 2017-04-26 MED ORDER — ONDANSETRON HCL 4 MG PO TABS: 4.0000 mg | ORAL_TABLET | Freq: Three times a day (TID) | ORAL | Status: DC | PRN

## 2017-04-26 MED ORDER — ALUM & MAG HYDROXIDE-SIMETH 200-200-20 MG/5ML PO SUSP: 30.0000 mL | Freq: Four times a day (QID) | ORAL | Status: DC | PRN

## 2017-04-26 MED ORDER — ESCITALOPRAM OXALATE 10 MG PO TABS
10.0000 mg | ORAL_TABLET | Freq: Every day | ORAL | Status: DC
  Administered 2017-04-26 – 2017-04-30 (×5): 10 mg via ORAL
  Filled 2017-04-26 (×5): qty 1

## 2017-04-26 MED ORDER — VITAMIN B-1 100 MG PO TABS
100.0000 mg | ORAL_TABLET | Freq: Every day | ORAL | Status: DC
  Administered 2017-04-27 – 2017-04-30 (×4): 100 mg via ORAL
  Filled 2017-04-26 (×4): qty 1

## 2017-04-26 NOTE — Progress Notes (Signed)
CSW spoke with Seth Bake with [facility] to complete APS report.  Wendelyn Breslow, [facility]  [phone]

## 2017-04-26 NOTE — ED Notes (Signed)
Regular diet dinner tray ordered at 1715

## 2017-04-26 NOTE — Clinical Social Work Note (Addendum)
Clinical Social Work Assessment  Patient Details  Name: Jillian Harmon MRN: [id number] Date of Birth: 09/18/1950  Date of referral:  04/26/17               Reason for consult:  Other (Comment Required)                Permission sought to share information with:  Family Supports Permission granted to share information::  Yes, Verbal Permission Granted  Name::        Agency::     Relationship::     Contact Information:     Housing/Transportation Living arrangements for the past 2 months:  Single Family Home([place], [place]) Source of Information:  Patient Patient Interpreter Needed:  None Criminal Activity/Legal Involvement Pertinent to Current Situation/Hospitalization:    Significant Relationships:  Adult Children Lives with:  Self Do you feel safe going back to the place where you live?  Yes Need for family participation in patient care:  Yes (Comment)  Care giving concerns:  No care giving concerns expressed to CSW from pt. From previous notes from pt's visit at [facility], 12/19, lack of family involvement and support were concerns.    Social Worker assessment / plan:  CSW consulted. CSW met with pt via bedside. Pt was seen in [place] yesterday, 12/19 for similar complaints/ problems.  Pt informed CSW that she has been living in [place], [place]. Pt stated she had been in [place] for about a week visiting her oldest son, Jillian Harmon. Pt stated she is scheduled to go back to [place]. Pt's story became difficult to follow at this time. Pt's younger son, Jillian Harmon lives near her in [place]. Pt lives alone, her husband died 14 years ago. Pt stated she took the train down to [place] and was supposed to return to [place]. Pt informed CSW that she lost her car. At this time pt told CSW that she does not like "them listening to me." Pt made a statement then informed CSW that a female was mocking her and told her that what she said was trash. This incident is documented in a  previous note. CSW ended the assessment and informed pt that she will attempt to contact her sons.   Please review social work case notes from [place] on 04/25/17.   Employment status:    Insurance information:  Medicare PT Recommendations:  Not assessed at this time Information / Referral to community resources:     Patient/Family's Response to care:  Pt is agreeable to plan of care at this time.   Patient/Family's Understanding of and Emotional Response to Diagnosis, Current Treatment, and Prognosis:  Pt expressed concerns about being listened to.   Emotional Assessment Appearance:  Appears stated age Attitude/Demeanor/Rapport:    Affect (typically observed):  Accepting, Quiet, Pleasant Orientation:    Alcohol / Substance use:    Psych involvement (Current and /or in the community):     Discharge Needs  Concerns to be addressed:  Other (Comment Required(Returning home safely) Readmission within the last 30 days:  No Current discharge risk:  Lack of support system Barriers to Discharge:  Family Issues   Wendelyn Breslow, LCSW 04/26/2017, 5:21 PM

## 2017-04-26 NOTE — Progress Notes (Signed)
CSW conducted CSW assessment with pt. Pt expressed that a female was mocking her and listening into our conversation. Pt doesn't want the female listening anymore. Pt stated that the person who is listening and standing in the room said that what she said is "trash." Pt informed CSW that "what I said isn't trash, tell her it isn't trash." CSW and pt were the only persons in the room. CSW received permission from pt to call pt's sons to let them know she is in the emergency room.   CSW notified pt's nurse of the hallucinations.   Wendelyn Breslow, [facility]  [phone]

## 2017-04-26 NOTE — ED Notes (Signed)
Pt is have auditory hallucinations, stating " someone is in the room calling me names."

## 2017-04-26 NOTE — ED Notes (Signed)
Pt on [facility].

## 2017-04-26 NOTE — Progress Notes (Addendum)
9:50 Pm CSW met with pt at pt's bedside. Pt's story of events is confusing to CSW. Pt informed CSW that she was found on the train tracks and brought in by ambulance. A few moments later, Pt informed CSW that she drove to the ED and needs to find her car. Pt stated she drove to [facility] to visit her oldest son. Pt then stated she took the train. Pt informed CSW that her youngest son lives with her.  CSW made a copy of pt's ID card.  CSW spoke with pt's nurse. Pt has a consult for TTS.   Address: [address]  CSW placed an APS report.   Plan: Pt has not been able to provide phone information for son in [place]. Son in [place] lives with pt in [place]. If unable to contact, send wellness check to [place] home.   Wendelyn Breslow, [facility]  [phone]

## 2017-04-26 NOTE — Progress Notes (Signed)
5:37 PM CSW attempted to call pt's son [name], [phone]. There is just a busy tone.  Wendelyn Breslow, [facility]  [phone]

## 2017-04-26 NOTE — ED Notes (Signed)
Pt's toes and distal bottoms of her feet are blue.  Per EMS this might be from the shoes she was wearing.

## 2017-04-26 NOTE — ED Provider Notes (Signed)
[facility] EMERGENCY DEPARTMENT Provider Note   CSN: [id number] Arrival date & time: 04/26/17  1434     History   Chief Complaint Chief Complaint  Patient presents with  . Cold Exposure   Level 5 caveat: Memory difficulty  HPI Jillian Harmon is a 66 y.o. female.  HPI Patient is a 65 year old female with a known history of prior alcohol abuse and memory deficits who was found walking outside with wet shoes.  Jillian Harmon is brought to the emergency department for further evaluation.  The patient reports that Jillian Harmon lives in [place].  Jillian Harmon is unable to describe the city.  Per medical records it appears that Jillian Harmon lives in [place] with her son.  Jillian Harmon was found to be hypothermic on arrival.   Past Medical History:  Diagnosis Date  . 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 Anemia   . Anxiety   . Closed head injury 2015; early 2017   "fell in my house"  . Delirium, withdrawal, alcoholic ([place]) 5009  . Depression   . H/O ETOH abuse    "has not had anything to 2 months" (10/2015)  . History of blood transfusion 10/18/2015   "anemia"  . 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 Hypoglycemia   . Hypothyroidism   . Memory difficulty 11/19/2015  . Memory loss   . Seizures ([place]) 11/19/2015  . Stroke [facility]) 2007   "dr's weren't sure if it was a stroke or seizure" (10/19/2015)    Patient Active Problem List   Diagnosis Date Noted  . Seizures ([place]) 11/19/2015  . Memory difficulty 11/19/2015  . Alcohol withdrawal seizure ([place]) 10/20/2015  . Syncope and collapse   . Faintness   . Gait instability   . Gastrointestinal hemorrhage associated with gastric ulcer   . Acute blood loss anemia 10/18/2015  . Syncope and collapse 10/18/2015  . Alcohol dependence ([place]) 10/18/2015  . Upper GI bleed 10/18/2015  . Bleeding gastrointestinal   . Symptomatic anemia     Past Surgical History:  Procedure Laterality Date  .  CHOLECYSTECTOMY OPEN    . ESOPHAGOGASTRODUODENOSCOPY N/A 10/19/2015   Procedure: ESOPHAGOGASTRODUODENOSCOPY (EGD);  Surgeon: Manus Gunning, MD;  Location: [facility];  Service: Gastroenterology;  Laterality: N/A;  . TONSILLECTOMY      OB History    No data available       Home Medications    Prior to Admission medications   Medication Sig Start Date End Date Taking? Authorizing Provider  Cholecalciferol (VITAMIN D3) 2000 units capsule Take 2,000 Units by mouth daily.    [provider]  escitalopram (LEXAPRO) 10 MG tablet Take 10 mg by mouth daily. 03/02/17   [provider]  folic acid (FOLVITE) 1 MG tablet Take 1 mg by mouth daily. 01/26/17   [provider]  levETIRAcetam (KEPPRA) 750 MG tablet Take 1 tablet (750 mg total) by mouth 2 (two) times daily. 04/25/17   Tanna Furry, MD  memantine [facility] TITRATION PAK) tablet pack 5 mg/day for =1 week; 5 mg twice daily for =1 week; 15 mg/day given in 5 mg and 10 mg separated doses for =1 week; then 10 mg twice daily 11/19/15   Kathrynn Ducking, MD  pantoprazole (PROTONIX) 40 MG tablet Take 1 tablet (40 mg total) by mouth 2 (two) times daily. X 30 days, then once daily thereafter Patient taking differently: Take 40 mg by mouth daily. X 30 days, then once daily thereafter  10/19/15   Orson Eva, MD  thiamine 100 MG tablet Take 100 mg by mouth daily. 07/10/16   [provider]  vitamin B-12 (CYANOCOBALAMIN) 500 MCG tablet Take 500 mcg by mouth daily.    [provider]    Family History Family History  Problem Relation Age of Onset  . Other Mother        Hypoglycemia  . Cancer Father   . Cancer Paternal Grandfather   . Seizures Neg Hx     Social History Social History   Tobacco Use  . Smoking status: Never Smoker  . Smokeless tobacco: Never Used  Substance Use Topics  . Alcohol use: No    Alcohol/week: 0.0 oz    Comment: Previous alcohol use, now none  . Drug use: No      Allergies   Sertraline   Review of Systems Review of Systems  Unable to perform ROS: Mental status change     Physical Exam Updated Vital Signs BP 132/78   Pulse 84   Temp (!) 95.6 F (35.3 C) (Rectal)   Resp 16   Ht 5\' 1"  (1.549 m)   Wt 57.2 kg (126 lb)   SpO2 100%   BMI 23.81 kg/m   Physical Exam  Constitutional: Jillian Harmon appears well-developed and well-nourished. No distress.  HENT:  Head: Normocephalic and atraumatic.  Eyes: EOM are normal.  Neck: Normal range of motion.  Cardiovascular: Normal rate, regular rhythm and normal heart sounds.  Pulmonary/Chest: Effort normal and breath sounds normal.  Abdominal: Soft. Jillian Harmon exhibits no distension. There is no tenderness.  Musculoskeletal: Normal range of motion.  PT and DP pulses normal bilaterally.  Feet are cool but perfused.  Neurological: Jillian Harmon is alert.  Skin: Skin is warm and dry.  Psychiatric: Jillian Harmon has a normal mood and affect. Judgment normal.  Nursing note and vitals reviewed.    ED Treatments / Results  Labs (all labs ordered are listed, but only abnormal results are displayed) Labs Reviewed  CBC - Abnormal; Notable for the following components:      Result Value   WBC 11.7 (*)    All other components within normal limits  COMPREHENSIVE METABOLIC PANEL - Abnormal; Notable for the following components:   CO2 19 (*)    Creatinine, Ser 1.34 (*)    Total Protein 8.4 (*)    GFR calc non Af Amer 41 (*)    GFR calc Af Amer 47 (*)    All other components within normal limits  ETHANOL  URINALYSIS, ROUTINE W REFLEX MICROSCOPIC  RAPID URINE DRUG SCREEN, HOSP PERFORMED  CBG MONITORING, ED    EKG  EKG Interpretation None       Radiology Ct Head Wo Contrast  Result Date: 04/25/2017 CLINICAL DATA:  Seizure today. Left-sided headache. Generalized weakness. EXAM: CT HEAD WITHOUT CONTRAST TECHNIQUE: Contiguous axial images were obtained from the base of the skull through the vertex without intravenous  contrast. COMPARISON:  10/18/2015 FINDINGS: Brain: Generalized atrophy. Chronic small-vessel ischemic changes throughout the white matter. No sign of acute infarction, mass lesion, hemorrhage, hydrocephalus or extra-axial collection. Vascular: There is atherosclerotic calcification of the major vessels at the base of the brain. Skull: No skull fracture. Sinuses/Orbits: Clear/normal Other: Mild left posterior parietal scalp swelling. IMPRESSION: No acute finding. Atrophy and chronic small-vessel ischemic changes of the cerebral hemispheric white matter. Small left parietal scalp hematoma. Electronically Signed   By: Nelson Chimes M.D.   On: 04/25/2017 09:56   Dg Knee Complete  4 Views Left  Result Date: 04/25/2017 CLINICAL DATA:  Left knee abrasions following a seizure today. EXAM: LEFT KNEE - COMPLETE 4+ VIEW COMPARISON:  None in PACs FINDINGS: The bones are subjectively adequately mineralized. There is no acute fracture nor dislocation. The joint spaces are well maintained. No joint effusion is observed. IMPRESSION: There is no acute or significant chronic bony abnormality of the left knee. Electronically Signed   By: David  Martinique M.D.   On: 04/25/2017 11:37   Dg Hip Unilat W Or Wo Pelvis 2-3 Views Right  Result Date: 04/25/2017 CLINICAL DATA:  Onset of seizure activity day with subsequent right hip pain. EXAM: DG HIP (WITH OR WITHOUT PELVIS) 2-3V RIGHT COMPARISON:  None in PACs FINDINGS: The bony pelvis is subjectively adequately mineralized. There is no lytic or blastic lesion nor acute fracture. AP and lateral views of the right hip reveal preservation of the joint space. The articular surfaces of the femoral head and acetabulum remains smoothly rounded. The femoral neck, intertrochanteric, and subtrochanteric regions are normal. IMPRESSION: There is no acute bony abnormality of the right hip. Electronically Signed   By: David  Martinique M.D.   On: 04/25/2017 11:36    Procedures Procedures (including  critical care time)  Medications Ordered in ED Medications - No data to display   Initial Impression / Assessment and Plan / ED Course  I have reviewed the triage vital signs and the nursing notes.  Pertinent labs & imaging results that were available during my care of the patient were reviewed by me and considered in my medical decision making (see chart for details).     The patient's feet are cool secondary to cold exposure.  Memory disturbance at baseline Jillian Harmon was seen in the emergency department with similar issues yesterday.  Was evaluated with labs and a head CT.  Yesterday the son was able to be contacted after social work was involved and he was able to come pick the patient up.  I am unable to contact the patient's son at this time.  I suspect Jillian Harmon wanders   Final Clinical Impressions(s) / ED Diagnoses   Final diagnoses:  None    ED Discharge Orders    None       Jola Schmidt, MD 04/26/17 [phone]

## 2017-04-26 NOTE — ED Provider Notes (Signed)
Assumed care from Dr. Venora Maples at [facility]. Briefly, the patient is a 66 y.o. 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 here with bizarre behavior.  Patient was just seen yesterday for possible seizure-like activity and was sent home.  Per review of records, she was having difficulty getting in touch with her son, who reportedly has psychiatric conditions, and there was some concern about her safety at home.  She was found wandering outside and is hypothermic on arrival.  Her lab work does show that she is likely mildly dehydrated, which I suspect is due to not eating and drinking overnight.  She is also mildly hypothermic.  Will give her warm fluids, blankets, and plan for social work consult.  She may also ultimately need a TTS evaluation as she is having hallucinations and reportedly has a psychiatric history.  Labs Reviewed  CBC - Abnormal; Notable for the following components:      Result Value   WBC 11.7 (*)    All other components within normal limits  COMPREHENSIVE METABOLIC PANEL - Abnormal; Notable for the following components:   CO2 19 (*)    Creatinine, Ser 1.34 (*)    Total Protein 8.4 (*)    GFR calc non Af Amer 41 (*)    GFR calc Af Amer 47 (*)    All other components within normal limits  ETHANOL  URINALYSIS, ROUTINE W REFLEX MICROSCOPIC  RAPID URINE DRUG SCREEN, HOSP PERFORMED  CBG MONITORING, ED    Course of Care: Urinalysis finally obtained.  She does have mild ketonuria but has been given warm fluids.  Her pressure is normal.  Her temperature is now normal.  However, she continues to have  hallucinations and erratic behavior.  She just had a CT head that was negative.  Her lab work is o/w unremarkable. She has a reported history of unknown psychiatric condition.  Given reassuring workup x2 with negative imaging, concern for possible decompensated psychiatric illness.  Will consult TTS.  I feel she is fairly high risk in her current living situation, and will need a safe plan at eventual discharge.     Duffy Bruce, MD 04/26/17 2152

## 2017-04-27 ENCOUNTER — Encounter (HOSPITAL_COMMUNITY): Payer: Self-pay | Admitting: Emergency Medicine

## 2017-04-27 DIAGNOSIS — R410 Disorientation, unspecified: Secondary | ICD-10-CM | POA: Diagnosis not present

## 2017-04-27 MED ORDER — LEVETIRACETAM ER 500 MG PO TB24
1000.0000 mg | ORAL_TABLET | Freq: Every day | ORAL | Status: DC
  Administered 2017-04-27 – 2017-04-30 (×4): 1000 mg via ORAL
  Filled 2017-04-27 (×6): qty 2

## 2017-04-27 MED ORDER — OLANZAPINE 5 MG PO TBDP
5.0000 mg | ORAL_TABLET | Freq: Once | ORAL | Status: AC
  Administered 2017-04-27: 5 mg via ORAL
  Filled 2017-04-27: qty 1

## 2017-04-27 NOTE — ED Notes (Signed)
Eulis Foster, MD notified re: need for IVC papers, Eulis Foster, MD to evaluate pt, per Nira Conn, pharmacy technician the pt lives in [place] and is having prescriptions filled at a [facility] in [place]

## 2017-04-27 NOTE — ED Notes (Signed)
Regular Diet ordered for Lunch. 

## 2017-04-27 NOTE — ED Notes (Signed)
Pt at nurse station confused reporting visual  Hallucinations of son [name] outside of the door in the ED, pt reoriented, sitter at bedside

## 2017-04-27 NOTE — ED Notes (Signed)
Pt at nurses station reporting that her son has waited outside the ED door for 45 mins, pt reoriented, encouraged to return to her room, sitter at bedside, will continue to monitor

## 2017-04-27 NOTE — ED Notes (Signed)
Pt ambulating in the hall with a sitter, Eulis Foster, MD placing orders

## 2017-04-27 NOTE — Progress Notes (Signed)
CSW spoke wit disposition CSW and confirmed that they are job still seeking placement for pt at this time. CSW attempted to reach out to pt's son [name] with the number provided in the chart, however there is a busy tone at this number.     Jillian Harmon, MSW, [facility] Clinical Social Worker [phone]

## 2017-04-27 NOTE — Progress Notes (Signed)
CSW reviewed Pt chart.  Pt meets criteria for inpatient tx. Per Lindon Romp, NP. Gero-psych placement is recommended.  Pt referrals sent to the following [facility]:  D[facility]  Disposition CSW's to continue to follow for placement.  [name]. [name], MSW, [facility] Clinical Social Work [phone] (cell) [phone] (office)

## 2017-04-27 NOTE — ED Notes (Signed)
Pt continues to come to the nurses station reporting that she sees her son at the door, pt reoriented, this RN attempted to contact the pts son with no response, will continue to reorient pt, sitter at bedside

## 2017-04-27 NOTE — ED Provider Notes (Signed)
Assessment for stability and possible Commitment.  The patient came to the ED, yesterday, and has been observed overnight, with plans now to place her in a psychiatric facility.  She had been discharged from [facility] emergency department, the day before, with her son.  It is unclear where she went after she left the ED from [facility].  She was found wandering prior to coming to this ED, yesterday.  She was cold and hypothermic.  Since arrival here she has been noted to be hearing things that are not present, and being suspicious and confused.  She believes that people are trying to hurt her.  At this time she is alert and communicative.  She remains confused and cannot tell me the history which was listed above.  She did not have overt delusions or hallucinations, while I was seeing her.  She did not appear to be responding to internal stimuli.  She was cooperative, and redirectable, by staff.  Because of history of delusions, paranoia, and hallucinations; I placed her under involuntary commitment, and signed the first opinion.  Patient will be placed in a psychiatric facility for care by psychiatry, as soon as possible.   Daleen Bo, MD 04/27/17 [phone]

## 2017-04-27 NOTE — ED Notes (Signed)
Breakfast tray ordered at 0612

## 2017-04-27 NOTE — BHH Counselor (Signed)
Re-assessment:  Patient denies SI and HI. When asked is she experiencing auditory or visual hallucinations patient responded in tangentiality response. Patient stated, "I am here to see my son [name]."   Per Earleen Newport, NP, patient continues to meet inpatient criteria.

## 2017-04-27 NOTE — ED Notes (Signed)
Pt continues to walk to the nurses station, [place], MD informed of pts visual  Hallucination and increased irritation, pt to receive meds, will look for MD order

## 2017-04-27 NOTE — BH Assessment (Signed)
Tele Assessment Note   Patient Name: Jillian Harmon MRN: [id number] Referring Physician: Dr. Duffy Bruce Location of Patient: [facility] Location of Provider: [place] is an 66 y.o. female.  -Clinician reviewed note by Dr. Ellender Hose.  She was found wandering outside and is hypothermic on arrival.  Her lab work does show that she is likely mildly dehydrated, which I suspect is due to not eating and drinking overnight.  She is also mildly hypothermic.  Will give her warm fluids, blankets, and plan for social work consult.  She may also ultimately need a TTS evaluation as she is having hallucinations and reportedly has a psychiatric history.  Patient is confusing to talk to at times.  It is unclear about how she got down to [place].  She said that she was visiting her son [name] here.  She said that her other son in [place] had driven her to the train station and she came down to visit [name] for a week.  She said that [name] has borrowed her car for the last few weeks.    Patient said that she was at the train station on the tracks when someone had called EMS for her.  When asked if she had any thoughts of killing herself she responded, "Yeah, when I wa on the train tracks today."  Patient talked about how her husband had committed suicide 9 years ago.  She said that she was thinking about him when she was on the train tracks.  Patient says she drinks "intermittently."  She cannot say how much she drinks but said that it has been daily and that the last time was yesterday.  Patient has been in inpatient facilities before but is unsure of where.  Patient has no current outpatient provider.  -Clinician discussed patient care with Lindon Romp, FNP who recommends inpatient geropsych care for patient.  Clinician informed nurse Mat of the disposition.   Diagnosis: F33.2 MDD recurrent severe; F10.20 ETOH use d/o moderate  Past Medical History:  Past Medical History:   Diagnosis Date  . 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 Anemia   . Anxiety   . Closed head injury 2015; early 2017   "fell in my house"  . Delirium, withdrawal, alcoholic ([place]) 2355  . Depression   . H/O ETOH abuse    "has not had anything to 2 months" (10/2015)  . History of blood transfusion 10/18/2015   "anemia"  . 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 Hypoglycemia   . Hypothyroidism   . Memory difficulty 11/19/2015  . Memory loss   . Seizures ([place]) 11/19/2015  . Stroke [facility]) 2007   "dr's weren't sure if it was a stroke or seizure" (10/19/2015)    Past Surgical History:  Procedure Laterality Date  . CHOLECYSTECTOMY OPEN    . ESOPHAGOGASTRODUODENOSCOPY N/A 10/19/2015   Procedure: ESOPHAGOGASTRODUODENOSCOPY (EGD);  Surgeon: Manus Gunning, MD;  Location: [facility];  Service: Gastroenterology;  Laterality: N/A;  . TONSILLECTOMY      Family History:  Family History  Problem Relation Age of Onset  . Other Mother        Hypoglycemia  . Cancer Father   . Cancer Paternal Grandfather   . Seizures Neg Hx     Social History:  reports that  has never smoked. she has never used smokeless tobacco. She reports that she does not drink alcohol or use drugs.  Additional Social History:  Alcohol / Drug Use Pain Medications: See PTA medication list Prescriptions: See PTA medication list Over the Counter: See PTA medication list History of alcohol / drug use?: Yes Substance #1 Name of Substance 1: ETOH 1 - Age of First Use: Teens 1 - Amount (size/oz): Varies 1 - Frequency: Daily 1 - Duration: Last couple years 1 - Last Use / Amount: 12/20 Cannot recall the amount  CIWA: CIWA-Ar BP: (!) 151/93 Pulse Rate: 80 COWS:    PATIENT STRENGTHS: (choose at least two) Average or above average intelligence Communication skills Supportive family/friends  Allergies:  Allergies  Allergen Reactions  . Sertraline     Other  reaction(s): psychological reaction Causes hallucinations/paranoid thoughts    Home Medications:  (Not in a hospital admission)  OB/GYN Status:  No LMP recorded. Patient is postmenopausal.  General Assessment Data Location of Assessment: [facility] ED TTS Assessment: In system Is this a Tele or Face-to-Face Assessment?: Tele Assessment Is this an Initial Assessment or a Re-assessment for this encounter?: Initial Assessment Marital status: Widowed Is patient pregnant?: No Pregnancy Status: No Living Arrangements: Alone(One of her sons lives closeby.) Can pt return to current living arrangement?: Yes Admission Status: Voluntary Is patient capable of signing voluntary admission?: No Referral Source: Self/Family/Friend Insurance type: [facility]     Crisis Care Plan Living Arrangements: Alone(One of her sons lives closeby.) Name of Psychiatrist: None Name of Therapist: None  Education Status Is patient currently in school?: No Highest grade of school patient has completed: MS  Risk to self with the past 6 months Suicidal Ideation: Yes-Currently Present Has patient been a risk to self within the past 6 months prior to admission? : Yes Suicidal Intent: Yes-Currently Present Has patient had any suicidal intent within the past 6 months prior to admission? : Yes Is patient at risk for suicide?: Yes Suicidal Plan?: Yes-Currently Present Has patient had any suicidal plan within the past 6 months prior to admission? : No Specify Current Suicidal Plan: Being struck by train. Access to Means: Yes Specify Access to Suicidal Means: Train station What has been your use of drugs/alcohol within the last 12 months?: ETOH use Previous Attempts/Gestures: No How many times?: 0 Other Self Harm Risks: None Triggers for Past Attempts: None known Intentional Self Injurious Behavior: None Family Suicide History: Yes(Husband died of suicide 9 years ago.) Recent stressful life event(s): Loss  (Comment)(Thinking about husband's death.) Persecutory voices/beliefs?: Yes Depression: Yes Depression Symptoms: Despondent, Tearfulness, Guilt, Loss of interest in usual pleasures, Feeling worthless/self pity Substance abuse history and/or treatment for substance abuse?: No Suicide prevention information given to non-admitted patients: Not applicable  Risk to Others within the past 6 months Homicidal Ideation: No Does patient have any lifetime risk of violence toward others beyond the six months prior to admission? : No Thoughts of Harm to Others: No Current Homicidal Intent: No Current Homicidal Plan: No Access to Homicidal Means: No Identified Victim: No one History of harm to others?: No Assessment of Violence: None Noted Violent Behavior Description: None reported Does patient have access to weapons?: No Criminal Charges Pending?: No Does patient have a court date: No Is patient on probation?: No  Psychosis Hallucinations: None noted Delusions: None noted  Mental Status Report Appearance/Hygiene: Disheveled, Unremarkable Eye Contact: Poor Motor Activity: Freedom of movement Speech: Soft, Slow, Logical/coherent Level of Consciousness: Quiet/awake Mood: Anxious, Sad, Guilty, Despair Affect: Appropriate to circumstance Anxiety Level: Panic Attacks Panic attack frequency: Once daily Most recent panic attack: Yesterday Thought Processes: Coherent Judgement: Unimpaired  Obsessive Compulsive Thoughts/Behaviors: None  Cognitive Functioning Concentration: Decreased Memory: Recent Impaired, Remote Intact IQ: Average Insight: Fair Impulse Control: Fair Appetite: Good Weight Loss: 0 Weight Gain: 0 Sleep: Increased Total Hours of Sleep: (<4H/D)  ADLScreening [facility] Assessment Services) Patient's cognitive ability adequate to safely complete daily activities?: Yes Patient able to express need for assistance with ADLs?: Yes Independently performs ADLs?: Yes (appropriate  for developmental age)  Prior Inpatient Therapy Prior Inpatient Therapy: No Prior Therapy Dates: None Prior Therapy Facilty/Provider(s): None Reason for Treatment: None  Prior Outpatient Therapy Prior Outpatient Therapy: No Prior Therapy Dates: None Prior Therapy Facilty/Provider(s): None Reason for Treatment: None Does patient have an ACCT team?: No Does patient have Intensive In-House Services?  : No Does patient have [facility] services? : No Does patient have P4CC services?: No  ADL Screening (condition at time of admission) Patient's cognitive ability adequate to safely complete daily activities?: Yes Is the patient deaf or have difficulty hearing?: No Does the patient have difficulty seeing, even when wearing glasses/contacts?: No Does the patient have difficulty concentrating, remembering, or making decisions?: Yes Patient able to express need for assistance with ADLs?: Yes Does the patient have difficulty dressing or bathing?: No Independently performs ADLs?: Yes (appropriate for developmental age) Does the patient have difficulty walking or climbing stairs?: No Weakness of Legs: None Weakness of Arms/Hands: None       Abuse/Neglect Assessment (Assessment to be complete while patient is alone) Abuse/Neglect Assessment Can Be Completed: Yes Physical Abuse: Denies Verbal Abuse: Denies Sexual Abuse: Denies Exploitation of patient/patient's resources: Denies Self-Neglect: Denies     Regulatory affairs officer (For Healthcare) Does Patient Have a Medical Advance Directive?: No Would patient like information on creating a medical advance directive?: No - Patient declined    Additional Information 1:1 In Past 12 Months?: No CIRT Risk: No Elopement Risk: No Does patient have medical clearance?: Yes     Disposition:  Disposition Initial Assessment Completed for this Encounter: Yes Disposition of Patient: Other dispositions(Pt to be referred to FNP.) Other  disposition(s): Other (Comment)  This service was provided via telemedicine using a 2-way, interactive audio and video technology.  Names of all persons participating in this telemedicine service and their role in this encounter. Name:  Role:   Name:  Role:   Name:  Role:   Name:  Role:     Raymondo Band 04/27/2017 1:56 AM

## 2017-04-27 NOTE — ED Notes (Signed)
TTS at bedside. 

## 2017-04-27 NOTE — ED Notes (Signed)
Pts belongings inventoried and placed in locker 3.  

## 2017-04-27 NOTE — ED Notes (Signed)
IVC paperwork arrived via [employer]

## 2017-04-27 NOTE — ED Notes (Signed)
IVC paperwork faxed to [facility] 

## 2017-04-27 NOTE — ED Notes (Signed)
Pt calling out for Vonna Kotyk (her son). Pt thinks he is outside of the double doors in pod F

## 2017-04-27 NOTE — ED Notes (Signed)
IVC paperwork completed by Eulis Foster, papers to be faxed to [facility]

## 2017-04-28 DIAGNOSIS — R410 Disorientation, unspecified: Secondary | ICD-10-CM | POA: Diagnosis not present

## 2017-04-28 NOTE — BHH Counselor (Signed)
Reassessment-Pt appeared confused and having thought blocking. Pt denies SI/HI and AVH.   TTS will continue to seek placement for the Pt.   Lorenza Cambridge, [facility] Triage Specialist

## 2017-04-28 NOTE — ED Notes (Signed)
Pt stood at doorway of another pt's room asking for bathroom. Pt directed to bathroom by Sitter. Pt ambulated to bathroom and back to room w/o difficulty.

## 2017-04-28 NOTE — ED Notes (Signed)
Woke pt and advised dinner tray has been delivered.

## 2017-04-28 NOTE — ED Notes (Signed)
Re-TTS completed.  

## 2017-04-29 DIAGNOSIS — R410 Disorientation, unspecified: Secondary | ICD-10-CM | POA: Diagnosis not present

## 2017-04-29 NOTE — Progress Notes (Signed)
CSW received a call from [place] at [facility].  They are full at the moment but may have a d/c this afternoon.  Patient is on their waiting list.  CSW will continue to follow for placement.  [name]. [name], MSW, [place] Disposition Clinical Social Work [phone] (cell) [phone] (office)

## 2017-04-29 NOTE — Progress Notes (Signed)
CSW spoke with [facility] ED Nurse, [name], RN and Case Manager, Mariann Laster.  Case Manager offered to review all notes from 12-19 to today to see if she can find pt's son, Andrew's phone number so that we can inform someone from her family as to her current status.  [name]. [name], MSW, [place] Disposition Clinical Social Work [phone] (cell) [phone] (office)

## 2017-04-29 NOTE — BHH Counselor (Signed)
Pt is a 66 year old female who presented to [facility] on 04/27/17 in a state of confusion and reporting suicidal ideation with plan to be hit by train.  She was found on train tracks in an apparent state of disorientation.  Pt was reassessed today.  Pt appeared to be somewhat disoriented.  Pt stated that she came to [place] to visit her son.  Pt acknowledged being found on train tracks, but she denied being suicidal.  Why was she on the train tracks?  Pt stated that she did not know.    Recommend continued inpatient.

## 2017-04-29 NOTE — ED Notes (Signed)
Pt to shower. Sitter w/pt.

## 2017-04-29 NOTE — Progress Notes (Signed)
CSW spoke again with [name]., RN @ [facility] ED.  Case Mgr. Located a phone number in the chart [phone]) that was thought to be a family member.  CSW called and spoke with a gentleman named, [name].  When Mr. [name] was asked if he knew Jillian Harmon, the patient's son who lives in [place], and his initial response was, "Are you kidding me?"  He then identified himself as the pt's POA without this writer acknowledging that I was calling about her or her son, Jillian Harmon.   He further said, "She went to [place] last weekend to visit her son, Jillian Harmon.  He probably didn't make sure that she took her medicine and she had a seizure.  Right?"  This writer told Mr. [name] that I could not identify any patient or the reason for any hospitalization, but did need the number for [facility].  Mr. [name] looked up [name] number [phone]) and relayed it to [place].  Mr. 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  Mr. [name] also gave CSW pt's Medicare and [employer] supplemental insurance and the name and  number of her PCP [name], MD. [phone]) CSW thanked Mr. [name] for all information.  CSW then called and got a voicemail message with [name] identifying himself.  CSW left a HIPAA compliant message asking for son to contact us.  CSW will continue to follow and will leave a message again at end of shift if son has not contacted [place] back.  [name]. [name], MSW, [place] Disposition Clinical Social Work [phone] (cell) [phone] (office)

## 2017-04-29 NOTE — ED Notes (Signed)
Re-TTS being performed.  

## 2017-04-29 NOTE — ED Notes (Signed)
Pt c/o pain when combing her hair. Pt noted w/swelling to left posterior head w/healing abrasion. Pt had CT performed upon arrival to ED on 04/26/17 d/t was found w/hypothermia. Pt denies falling/injurying self while in ED.

## 2017-04-29 NOTE — ED Notes (Signed)
Dinner tray delivered to pt 

## 2017-04-29 NOTE — Care Management (Addendum)
Update by LCSW phone number listed was identified to be patient's POA Jillian Harmon, and he was able to provide her son in [place] contact information Jillian Harmon [phone].  BH continue to seek placement

## 2017-04-29 NOTE — ED Notes (Signed)
Woke pt to advise breakfast tray is on bedside table. Pt ambulated to bathroom and back to room w/o difficulty. Pt eating breakfast.

## 2017-04-29 NOTE — ED Notes (Addendum)
Pt given water as requested. Pt has been requesting to call her sons. Pt attempted to call earlier this am, no answer. Pt on phone w/her son, Jonni Sanger, at this time.

## 2017-04-29 NOTE — Care Management (Signed)
ED CM signed into care everywhere to access contact information regarding emergency contacts located [place] number listed for patient [phone], information was provided to Disposition LCSW Romie Minus at [facility].

## 2017-04-30 DIAGNOSIS — F332 Major depressive disorder, recurrent severe without psychotic features: Secondary | ICD-10-CM

## 2017-04-30 DIAGNOSIS — F1010 Alcohol abuse, uncomplicated: Secondary | ICD-10-CM

## 2017-04-30 DIAGNOSIS — R410 Disorientation, unspecified: Secondary | ICD-10-CM | POA: Diagnosis not present

## 2017-04-30 DIAGNOSIS — G4700 Insomnia, unspecified: Secondary | ICD-10-CM

## 2017-04-30 NOTE — Progress Notes (Signed)
CSW received phone call from pt's son, [name], [phone]. [name] stated he would be able to pick her up in about 30 minutes.  Wendelyn Breslow, [facility]  [phone]

## 2017-04-30 NOTE — ED Notes (Signed)
Apolonio Schneiders with social work contacted this Therapist, sports and stated she was able to get in to contact with pt's son [name] in [place]. Stated he will be here to pick her up in about 30 minutes. Per social work she will follow up if son does not show up in an hour.   Apolonio Schneiders social work: [phone]

## 2017-04-30 NOTE — ED Notes (Signed)
Pt POA returned phone call, stated pt is not ready for discharge. This RN made POA aware that pt was assessed by an NP this morning who made that decision that pt is able to return home. POA gave an updated number for pt son in [place]. Will attempt to contact.

## 2017-04-30 NOTE — ED Notes (Signed)
Appetite good, sitting up in bed. Patient has abrasion to left knee, scratched it dried blood, knee cleaned , antibiotic ointment applied and dressing placed,

## 2017-04-30 NOTE — ED Notes (Signed)
Attempted to call pt son and POA to make aware of pt dispo to d/c

## 2017-04-30 NOTE — ED Notes (Signed)
Pt son Jillian Harmon) from [place] returned phone call. Stated he is 6 hours away visiting other family. He requested resources to find placement for his mother in [place], this RN contacted social work who stated that they do not have access to facilities in [place] and they would have to seek treatment at a hospital in [place] in order to find placement. Jillian Harmon made aware and requested we call pt son in [place] to come and get her in the meantime. This RN has called son in [place]) x3 with no response. Will continue to call.

## 2017-04-30 NOTE — Progress Notes (Signed)
LCSW has reviewed case with NP. At this time patient has been psychiatrically cleared and can return home.  RN called x2 to be given disposition, unable to reach thus LCSW will continue to try and reach.  DC plan: home.  Lane Hacker, MSW Clinical Social Work: [employer] Coverage for :  [phone]

## 2017-04-30 NOTE — Progress Notes (Signed)
CSW reached out to pt's son to inform him of pt's disposition at this time. CSW was unable to reach son at this time.    Virgie Dad Lynnetta Tom, MSW, [place] [facility] Clinical Social Worker [phone]

## 2017-04-30 NOTE — ED Notes (Signed)
POA called back to see if pt had brought her purse with her that has money, ID, list of medications and medicaid card. This RN checked pt locker, found the purse which contained an uninventoried $387, pt medicaid card, ID, and list of pt medications. POA made aware pt had her belongings.

## 2017-04-30 NOTE — ED Notes (Signed)
RN escorted pt and her son to the lobby. Pt and son given d/c instructions, both verbalized understanding.

## 2017-04-30 NOTE — Progress Notes (Signed)
CSW called non emergency [employer] to call a Wellness Check on pt's son, Jillian Harmon at [address], [place], [place].   [place], [facility]  [phone]

## 2017-04-30 NOTE — ED Notes (Signed)
Pt given back her belongings, son is here to pick up pt

## 2017-04-30 NOTE — Consult Note (Signed)
Telepsych Consultation   Reason for Consult: MDD  Referring Physician: Dr. Duffy Bruce Location of Patient: [name] ED Location of Provider: [facility]  Patient Identification: Jillian Harmon MRN:  [id number] Principal Diagnosis: <principal problem not specified> Diagnosis:   Patient Active Problem List   Diagnosis Date Noted  . Seizures ([place]) [R56.9] 11/19/2015  . Memory difficulty [R41.3] 11/19/2015  . Alcohol withdrawal seizure ([place]) [M54.650, R56.9] 10/20/2015  . Syncope and collapse [R55]   . Faintness [R55]   . Gait instability [R26.81]   . Gastrointestinal hemorrhage associated with gastric ulcer [K25.4]   . Acute blood loss anemia [D62] 10/18/2015  . Syncope and collapse [R55] 10/18/2015  . Alcohol dependence ([place]) [F10.20] 10/18/2015  . Upper GI bleed [K92.2] 10/18/2015  . Bleeding gastrointestinal [K92.2]   . Symptomatic anemia [D64.9]     Total Time spent with patient: 30 minutes  Subjective:   Jillian Harmon is a 66 y.o. female patient admitted with MDD recurrent severe; ETOH use d/o moderate.  HPI: Per the TTS assessment completed on 04/27/17 by Curlene Dolphin: Jillian Harmon is an 66 y.o. female.  -Clinician reviewed note by Dr. Ellender Hose.  She was found wandering outside and is hypothermic on arrival. Her lab work does show that she is likely mildly dehydrated, which I suspect is due to not eating and drinking overnight. She is also mildly hypothermic. Will give her warm fluids, blankets, and plan for social work consult. She may also ultimately need a TTS evaluation as she is having hallucinations and reportedly has a psychiatric history.  Patient is confusing to talk to at times.  It is unclear about how she got down to [place].  She said that she was visiting her son Jillian Harmon here.  She said that her other son in [place] had driven her to the train station and she came down to visit [name] for a week.  She said that Jillian Harmon has borrowed her car for  the last few weeks.    Patient said [place] someone had called EMS for her.  When asked if she had any thoughts of killing herself she responded, "Yeah, when I wa on the train tracks today."  Patient talked about how her husband had committed suicide 9 years ago.  She said that she was thinking about him when she was on the train tracks.  Patient says she drinks "intermittently."  She cannot say how much she drinks but said that it has been daily and that the last time was yesterday.  Patient has been in inpatient facilities before but is unsure of where.  Patient has no current outpatient provider.  -Clinician discussed patient care with Lindon Romp, FNP who recommends inpatient geropsych care for patient.  Clinician informed nurse Mat of the disposition.  On Exam: Patient was seen via tele-psych, chart reviewed with treatment team. Patient in bed, awake, alert and oriented x4. Patient reiterated the reason for this hospital admission as documented above. Patient stated,"I don't know why I'm in the hospital. I visited my son in [place], and on my way back to [place], I ended up in the hospital". Patient stated that she is doing "so-so" today. She did not sleep good last night due to being in a unfamiliar place. 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 She reported that she leaves alone in her condominium in [place]. She is able to  take care of her ADLs. She says she does have a friend, Mr. Jillian Harmon, who comes periodically to ensure her bills are paid. Patient stated that she has in the past diagnosed herself with depression. Patient denies any suicide/homicide ideations, as well as visual and auditory hallucinations. Patient gave permission for [place] to speak to Jillian Harmon and her-2 sons. Patient's does not appear to be responding to internal stimuli during the encounter. Her mood and affect is  appropriate and congruent patient requested to be discharged.  Past Psychiatric History: As in H&P  Risk to Self: Suicidal Ideation: Yes-Currently Present Suicidal Intent: Yes-Currently Present Is patient at risk for suicide?: Yes Suicidal Plan?: Yes-Currently Present Specify Current Suicidal Plan: Being struck by train. Access to Means: Yes Specify Access to Suicidal Means: Train station What has been your use of drugs/alcohol within the last 12 months?: ETOH use How many times?: 0 Other Self Harm Risks: None Triggers for Past Attempts: None known Intentional Self Injurious Behavior: None Risk to Others: Homicidal Ideation: No Thoughts of Harm to Others: No Current Homicidal Intent: No Current Homicidal Plan: No Access to Homicidal Means: No Identified Victim: No one History of harm to others?: No Assessment of Violence: None Noted Violent Behavior Description: None reported Does patient have access to weapons?: No Criminal Charges Pending?: No Does patient have a court date: No Prior Inpatient Therapy: Prior Inpatient Therapy: No Prior Therapy Dates: None Prior Therapy Facilty/Provider(s): None Reason for Treatment: None Prior Outpatient Therapy: Prior Outpatient Therapy: No Prior Therapy Dates: None Prior Therapy Facilty/Provider(s): None Reason for Treatment: None Does patient have an ACCT team?: No Does patient have Intensive In-House Services?  : No Does patient have [facility] services? : No Does patient have P4CC services?: No  Past Medical History:  Past Medical History:  Diagnosis Date  . Alcohol related seizure ([place])   . Anemia   . Anxiety   . Closed head injury 2015; early 2017   "fell in my house"  . Delirium, withdrawal, alcoholic ([place]) 3009  . Depression   . H/O ETOH abuse    "has not had anything to 2 months" (10/2015)  . History of blood transfusion 10/18/2015   "anemia"  . History of esophagogastroduodenoscopy (EGD) 07/2013   erosive gastritis, no  varices ([place])  . Hypoglycemia   . Hypothyroidism   . Memory difficulty 11/19/2015  . Memory loss   . Seizures ([place]) 11/19/2015  . Stroke [facility]) 2007   "dr's weren't sure if it was a stroke or seizure" (10/19/2015)    Past Surgical History:  Procedure Laterality Date  . CHOLECYSTECTOMY OPEN    . ESOPHAGOGASTRODUODENOSCOPY N/A 10/19/2015   Procedure: ESOPHAGOGASTRODUODENOSCOPY (EGD);  Surgeon: Manus Gunning, MD;  Location: [facility];  Service: Gastroenterology;  Laterality: N/A;  . TONSILLECTOMY     Family History:  Family History  Problem Relation Age of Onset  . Other Mother        Hypoglycemia  . Cancer Father   . Cancer Paternal Grandfather   . Seizures Neg Hx    Family Psychiatric  History: unknown  Social History:  Social History   Substance and Sexual Activity  Alcohol Use No  . Alcohol/week: 0.0 oz   Comment: Previous alcohol use, now none     Social History   Substance and Sexual Activity  Drug Use No    Social History   Socioeconomic History  . Marital status: Widowed    Spouse name: None  . Number of children:  2  . Years of education: Doctorate  . Highest education level: None  Social Needs  . Financial resource strain: None  . Food insecurity - worry: None  . Food insecurity - inability: None  . Transportation needs - medical: None  . Transportation needs - non-medical: None  Occupational History  . Occupation: N/A  Tobacco Use  . Smoking status: Never Smoker  . Smokeless tobacco: Never Used  Substance and Sexual Activity  . Alcohol use: No    Alcohol/week: 0.0 oz    Comment: Previous alcohol use, now none  . Drug use: No  . Sexual activity: Not Currently  Other Topics Concern  . None  Social History Narrative   Currently staying w/ her son    Right-handed   Drinks between 1 and 6 cups of black coffee or sodas per day   Additional Social History:    Allergies:   Allergies  Allergen Reactions  . Sertraline Other (See  Comments)     Causes hallucinations/paranoid thoughts    Labs: No results found for this or any previous visit (from the past 48 hour(s)).  Medications:  Current Facility-Administered Medications  Medication Dose Route Frequency Provider Last Rate Last Dose  . acetaminophen (TYLENOL) tablet 650 mg  650 mg Oral Q4H PRN Duffy Bruce, MD   650 mg at 04/27/17 7829  . alum & mag hydroxide-simeth (MAALOX/MYLANTA) 200-200-20 MG/5ML suspension 30 mL  30 mL Oral Q6H PRN Duffy Bruce, MD      . cyanocobalamin tablet 500 mcg  500 mcg Oral Daily Duffy Bruce, MD   500 mcg at 04/29/17 0949  . escitalopram (LEXAPRO) tablet 10 mg  10 mg Oral Daily Duffy Bruce, MD   10 mg at 56/21/30 8657  . folic acid (FOLVITE) tablet 1 mg  1 mg Oral Daily Duffy Bruce, MD   1 mg at 04/30/17 1015  . levETIRAcetam (KEPPRA XR) 24 hr tablet 1,000 mg  1,000 mg Oral Daily Daleen Bo, MD   1,000 mg at 04/29/17 0949  . ondansetron (ZOFRAN) tablet 4 mg  4 mg Oral Q8H PRN Duffy Bruce, MD      . pantoprazole (PROTONIX) EC tablet 40 mg  40 mg Oral Daily Duffy Bruce, MD   40 mg at 04/30/17 1015  . thiamine (VITAMIN B-1) tablet 100 mg  100 mg Oral Daily Duffy Bruce, MD   100 mg at 04/30/17 1015   Current Outpatient Medications  Medication Sig Dispense Refill  . escitalopram (LEXAPRO) 10 MG tablet Take 10 mg by mouth daily.    . folic acid (FOLVITE) 1 MG tablet Take 1 mg by mouth daily.    . Levetiracetam (KEPPRA XR) 750 MG TB24 Take 750 mg by mouth daily.    . pantoprazole (PROTONIX) 40 MG tablet Take 1 tablet (40 mg total) by mouth 2 (two) times daily. X 30 days, then once daily thereafter (Patient taking differently: Take 40 mg by mouth daily. ) 60 tablet 1  . Cholecalciferol (VITAMIN D3) 2000 units capsule Take 2,000 Units by mouth daily.    Marland Kitchen levETIRAcetam (KEPPRA) 750 MG tablet Take 1 tablet (750 mg total) by mouth 2 (two) times daily. (Patient not taking: Reported on 04/27/2017) 60 tablet 0  .  memantine (NAMENDA TITRATION PAK) tablet pack 5 mg/day for =1 week; 5 mg twice daily for =1 week; 15 mg/day given in 5 mg and 10 mg separated doses for =1 week; then 10 mg twice daily (Patient not taking: Reported on 04/27/2017) 49  tablet 0  . thiamine 100 MG tablet Take 100 mg by mouth daily.    . vitamin B-12 (CYANOCOBALAMIN) 500 MCG tablet Take 500 mcg by mouth daily.      Musculoskeletal: UTA via camera  Psychiatric Specialty Exam: Physical Exam  Nursing note and vitals reviewed.   Review of Systems  Psychiatric/Behavioral: Positive for depression. Negative for hallucinations, substance abuse and suicidal ideas. The patient has insomnia. The patient is not nervous/anxious.   All other systems reviewed and are negative.   Blood pressure (!) 140/101, pulse 71, temperature 98.4 F (36.9 C), temperature source Oral, resp. rate 16, height '5\' 1"'  (1.549 m), weight 57.2 kg (126 lb), SpO2 98 %.Body mass index is 23.81 kg/m.  General Appearance: on hospital scrub  Eye Contact:  Good  Speech:  Clear and Coherent and Normal Rate  Volume:  Normal  Mood:  Euthymic  Affect:  Appropriate and Congruent  Thought Process:  Coherent and Goal Directed  Orientation:  Full (Time, Place, and Person)  Thought Content:  WDL and Logical  Suicidal Thoughts:  No  Homicidal Thoughts:  No  Memory:  Immediate;   Good Recent;   Good Remote;   Fair  Judgement:  Intact  Insight:  Present  Psychomotor Activity:  Normal  Concentration:  Concentration: Good and Attention Span: Good  Recall:  Good  Fund of Knowledge:  Good  Language:  Good  Akathisia:  Negative  Handed:  Right  AIMS (if indicated):     Assets:  Communication Skills Desire for Improvement Financial Resources/Insurance Housing Leisure Time Physical Health Resilience Social Support  ADL's:  Intact  Cognition:  WNL  Sleep:        Treatment Plan Summary: Plan to discharge patient home when medically cleared  Disposition: No  evidence of imminent risk to self or others at present.   Patient does not meet criteria for psychiatric inpatient admission. Supportive therapy provided about ongoing stressors. Refer to IOP. Discussed crisis plan, support from social network, calling 911, coming to the Emergency Department, and calling Suicide Hotline.  This service was provided via telemedicine using a 2-way, interactive audio and video technology.  Names of all persons participating in this telemedicine service and their role in this encounter. Name: Kashae Carstens Role: Patient  Name: Justina A. Okonkwo  Role: NP          Vicenta Aly, NP 04/30/2017 10:50 AM

## 2017-05-03 NOTE — Progress Notes (Signed)
Pt's POA, York Cerise, contacted this Probation officer with concerns that she had been d/c'd on 04/30/17.  CSW explained that patient no longer met criteria for inpatient treatment and had been d/c'd with her son, from [place] where she lives, picking her up.  Mr. [name], "Stated, "Well now she's my problem."  CSW asked whether he and her son had considered seeking legal guardianship. Mr. [name] stated, "I'm an 66 year-old man.  The last thing I want to do is be her legal guardian."  Romie Minus T. [name], MSW, [place] [facility] Clinical Social Work [phone] (cell) [phone] (office)

## 2017-07-06 DIAGNOSIS — Z8679 Personal history of other diseases of the circulatory system: Secondary | ICD-10-CM

## 2017-07-06 HISTORY — DX: Personal history of other diseases of the circulatory system: Z86.79

## 2018-08-29 IMAGING — CT CT HEAD W/O CM
3 series · 14 of 47 positions shown, 16 images · non-contrast
Comparison: 10/18/2015

CLINICAL DATA: Seizure today. Left-sided headache. Generalized
weakness.

EXAM:
CT HEAD WITHOUT CONTRAST
TECHNIQUE: Contiguous axial images were obtained from the base of the skull
through the vertex without intravenous contrast.

[Series 2: head wo · axial · 0.43mm/px · z∈[-155,-25]mm · 8 of 32 slices shown, 10 images]
[im 3/32  brain]
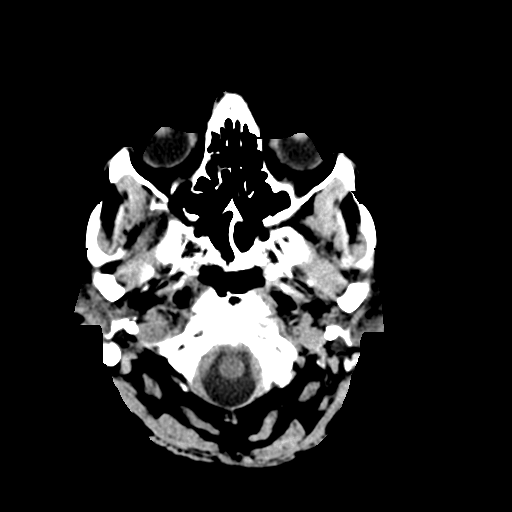
[im 3/32  bone]
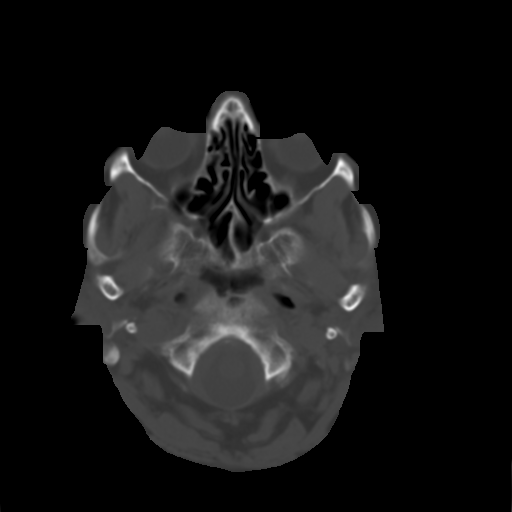
[im 7/32  brain]
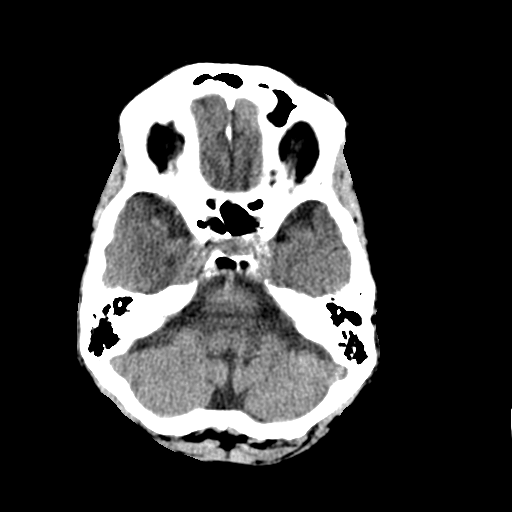
[im 10/32  brain]
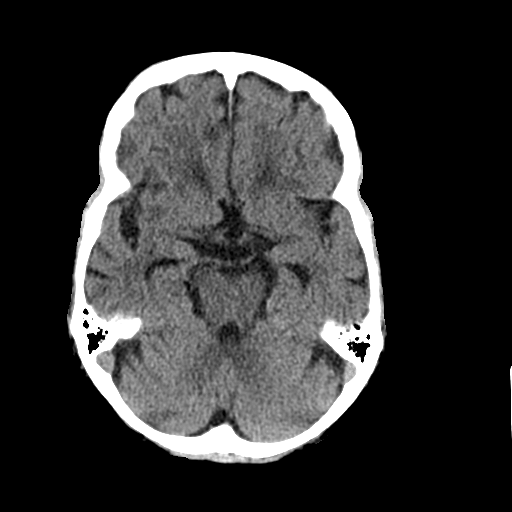
[im 14/32  brain]
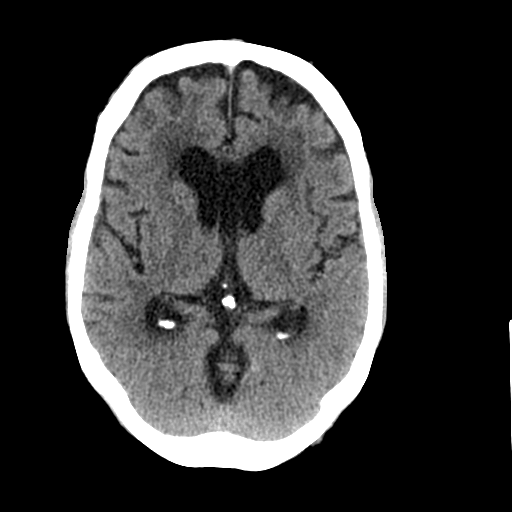
[im 18/32  brain]
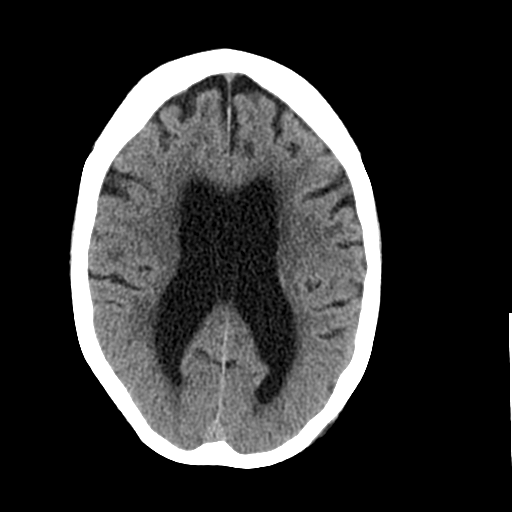
[im 18/32  bone]
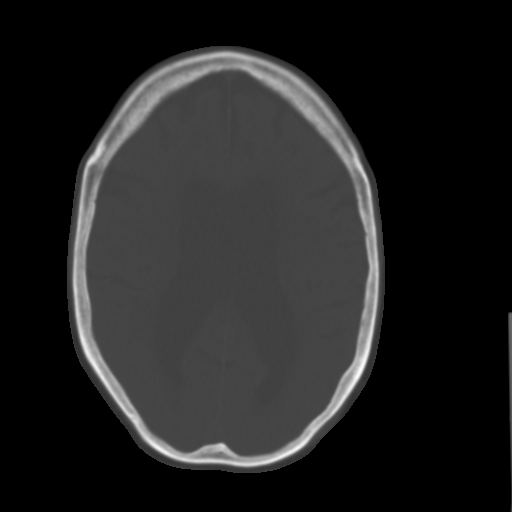
[im 22/32  brain]
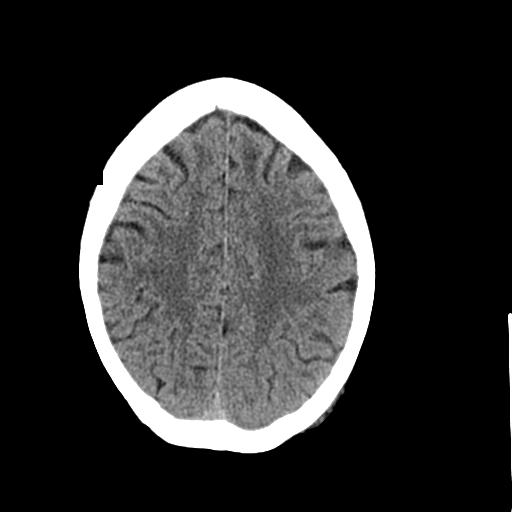
[im 25/32  brain]
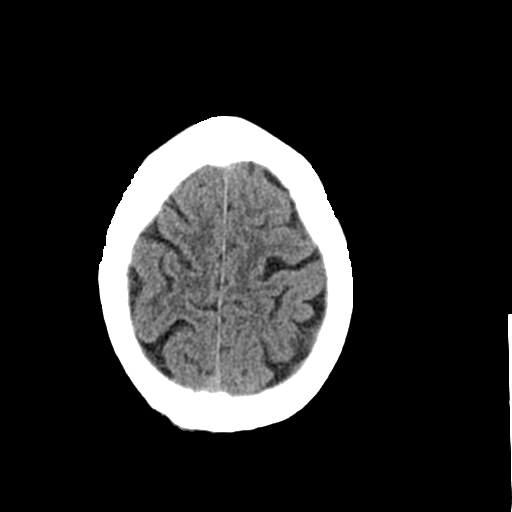
[im 29/32  brain]
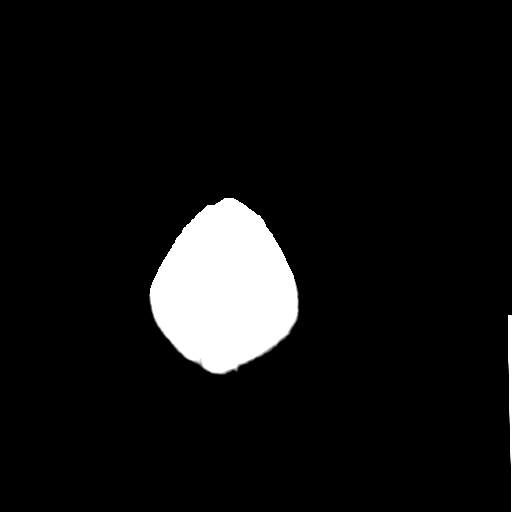

[Series 4: coronal soft tissue · coronal · 0.31mm/px · 3 of 74 slices shown]
[im 25/74  brain]
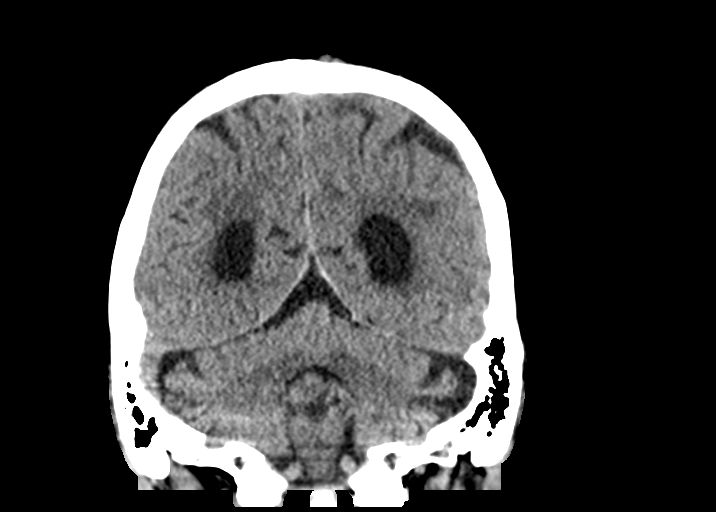
[im 33/74  brain]
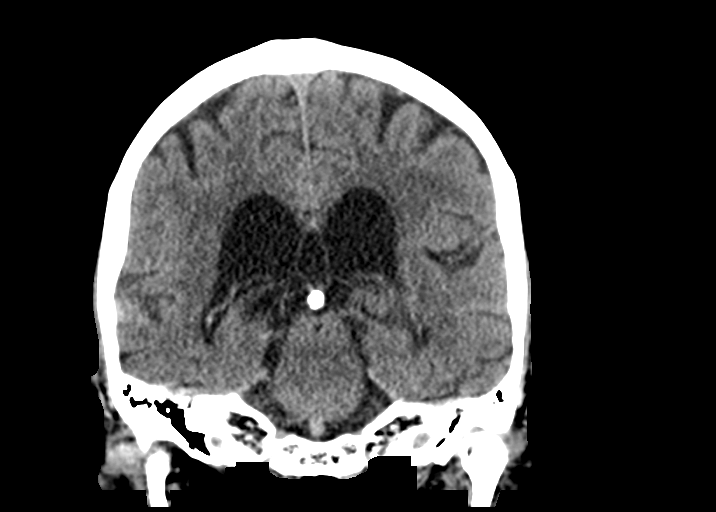
[im 41/74  brain]
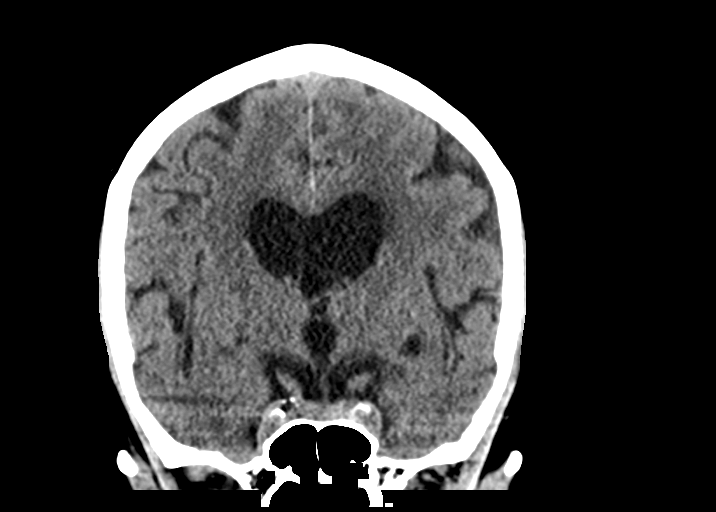

[Series 5: sagittal soft tissue · sagittal · 0.31mm/px · 3 of 54 slices shown]
[im 18/54  brain]
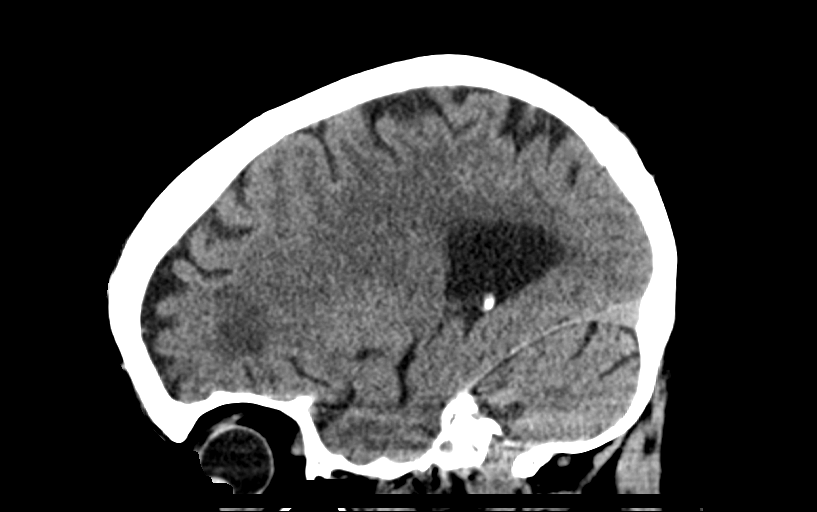
[im 27/54  brain]
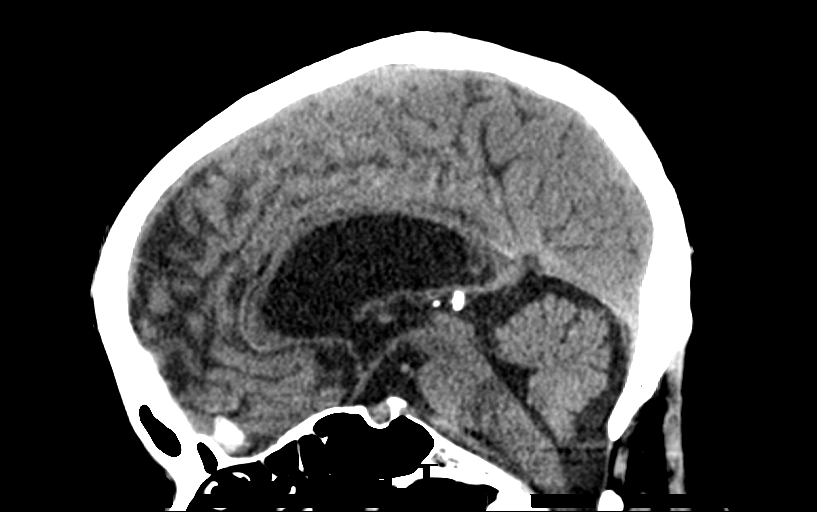
[im 36/54  brain]
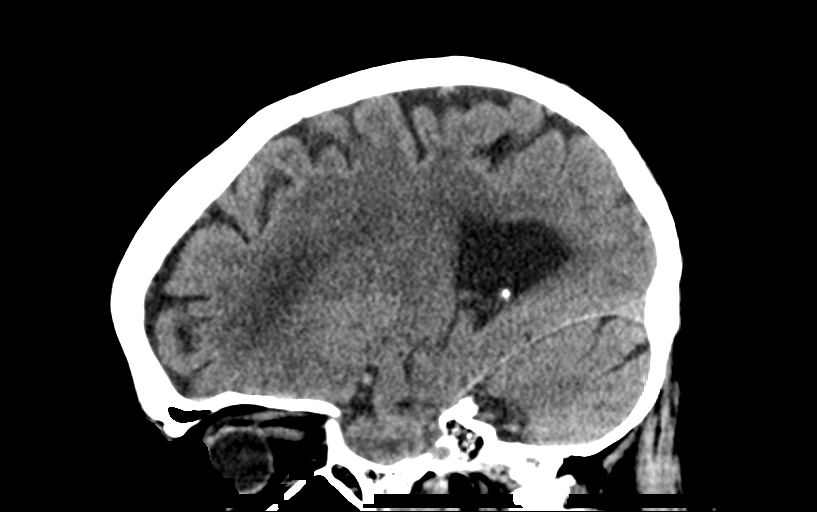

[14 of 47 positions shown; findings below may reference images not displayed]

FINDINGS: Brain: Generalized atrophy. Chronic small-vessel ischemic changes
throughout the white matter. No sign of acute infarction, mass
lesion, hemorrhage, hydrocephalus or extra-axial collection.

Vascular: There is atherosclerotic calcification of the major
vessels at the base of the brain.

Skull: No skull fracture.

Sinuses/Orbits: Clear/normal

Other: Mild left posterior parietal scalp swelling.
IMPRESSION: No acute finding. Atrophy and chronic small-vessel ischemic changes
of the cerebral hemispheric white matter.

Small left parietal scalp hematoma.

## 2018-11-11 ENCOUNTER — Ambulatory Visit: Payer: Self-pay | Admitting: Surgery

## 2018-11-11 NOTE — H&P (Signed)
Jillian Harmon Documented: 11/11/2018 2:07 PM Location: [facility] Patient #: [id number] DOB: [birth year] Married / Language: English / Race: White Female  Patient Care Team: Kristie Cowman, MD as PCP - General (Family Medicine) Michael Boston, MD as Consulting Physician (General Surgery) Marylynn Pearson, MD as Referring Physician (Internal Medicine)   History of Present Illness Jillian Hector MD; 11/11/2018 2:38 PM) The patient is a 68 year old female who presents with an incisional hernia. Note for "Incisional hernia": ` ` ` Patient sent for surgical consultation at the request of Dr Dillard Essex  Chief Complaint: Swelling and right upper quadrant. Question of hernia. ` ` The patient is a woman a history of heavy alcohol use with seizure disorder as well as mild dementia controlled on the Mindy a. Jerilee Hoh had some intermittent pain and swelling. History of lap cholecystectomy many years ago in [place] area. An ultrasound in 2018 which showed no gallbladder remnant nor any other abnormalities. However had some persistent complaints. Evaluated by her medicine doctor at her facility where she lives. Sent for ultrasound. Concern for possible hernia containing small bowel in the epigastric region. Surgical consultation requested.  Patient comes in today with her son. She lives at [facility] assisted living. However, she thinks she still lives up in [place] it is just visiting. Son notes that she's been living there for almost a year. Patient denies much pain but she does think it is getting larger. It is uncomfortable if it gets bumped or touched. No nausea or vomiting. Denies any heartburn or reflux. No retching. No fall or trauma. She initially did not recall getting a cholecystectomy but when I mentioned to her and she'll the incision she can recall. Denies any other surgery. I don't know how reliable historian she is, but she claims she can walk about 30  minutes. She claims to have about 2 bowel movements a day. She does not smoke tobacco. Not been any drinking this year, although she claims she occasionally does. Son notes that she is not allowed alcohol at the assisted living.  (Review of systems as stated in this history (HPI) or in the review of systems. Otherwise all other 12 point ROS are negative) ` ` `   Past Surgical History Emeline Gins, [place]; 11/11/2018 2:08 PM) Tonsillectomy  Diagnostic Studies History Emeline Gins, [place]; 11/11/2018 2:08 PM) Colonoscopy never Mammogram >3 years ago Pap Smear >5 years ago  Allergies Emeline Gins, [place]; 11/11/2018 2:10 PM) Zoloft *ANTIDEPRESSANTS* Allergies Reconciled  Medication History Emeline Gins, CMA; 11/11/2018 2:12 PM) Escitalopram Oxalate (20MG  Tablet, Oral) Active. Flonase Sensimist (27.5MCG/SPRAY Suspension, Nasal) Active. levETIRAcetam (500MG  Tablet, Oral) Active. Loratadine (10MG  Tablet, Oral) Active. LORazepam (0.5MG  Tablet, Oral) Active. Medications Reconciled  Pregnancy / Birth History Emeline Gins, [place]; 11/11/2018 2:08 PM) Age at menarche 78 years. Age of menopause >53 Gravida 2 Length (months) of breastfeeding 7-12 Maternal age 76-30 Para 2  Other Problems Emeline Gins, [place]; 11/11/2018 2:08 PM) Seizure Disorder Thyroid Cancer Thyroid Disease     Review of Systems Emeline Gins CMA; 11/11/2018 2:08 PM) General Not Present- Appetite Loss, Chills, Fatigue, Fever, Night Sweats, Weight Gain and Weight Loss. HEENT Present- Seasonal Allergies and Wears glasses/contact lenses. Not Present- Earache, Hearing Loss, Hoarseness, Nose Bleed, Oral Ulcers, Ringing in the Ears, Sinus Pain, Sore Throat, Visual Disturbances and Yellow Eyes. Respiratory Not Present- Bloody sputum, Chronic Cough, Difficulty Breathing, Snoring and Wheezing. Breast Not Present- Breast Mass, Breast Pain, Nipple Discharge and Skin Changes. Cardiovascular Not  Present- Chest  Pain, Difficulty Breathing Lying Down, Leg Cramps, Palpitations, Rapid Heart Rate, Shortness of Breath and Swelling of Extremities. Female Genitourinary Not Present- Frequency, Nocturia, Painful Urination, Pelvic Pain and Urgency. Neurological Present- Seizures. Not Present- Decreased Memory, Fainting, Headaches, Numbness, Tingling, Tremor, Trouble walking and Weakness. Psychiatric Not Present- Anxiety, Bipolar, Change in Sleep Pattern, Depression, Fearful and Frequent crying. Endocrine Not Present- Cold Intolerance, Excessive Hunger, Hair Changes, Heat Intolerance, Hot flashes and New Diabetes.  Vitals Emeline Gins CMA; 11/11/2018 2:09 PM) 11/11/2018 2:09 PM Weight: 141.6 lb Height: 61in Body Surface Area: 1.63 m Body Mass Index: 26.75 kg/m  Temp.: 50F  Pulse: 86 (Regular)  BP: 130/84 (Sitting, Left Arm, Standard)        Physical Exam Jillian Hector MD; 11/11/2018 2:35 PM)  General Mental Status-Alert. General Appearance-Not in acute distress, Not Sickly. Orientation-Oriented X3. Hydration-Well hydrated. Voice-Normal.  Integumentary Global Assessment Upon inspection and palpation of skin surfaces of the - Axillae: non-tender, no inflammation or ulceration, no drainage. and Distribution of scalp and body hair is normal. General Characteristics Temperature - normal warmth is noted.  Head and Neck Head-normocephalic, atraumatic with no lesions or palpable masses. Face Global Assessment - atraumatic, no absence of expression. Neck Global Assessment - no abnormal movements, no bruit auscultated on the right, no bruit auscultated on the left, no decreased range of motion, non-tender. Trachea-midline. Thyroid Gland Characteristics - non-tender.  Eye Eyeball - Left-Extraocular movements intact, No Nystagmus - Left. Eyeball - Right-Extraocular movements intact, No Nystagmus - Right. Cornea - Left-No Hazy - Left. Cornea -  Right-No Hazy - Right. Sclera/Conjunctiva - Left-No scleral icterus, No Discharge - Left. Sclera/Conjunctiva - Right-No scleral icterus, No Discharge - Right. Pupil - Left-Direct reaction to light normal. Pupil - Right-Direct reaction to light normal.  ENMT Ears Pinna - Left - no drainage observed, no generalized tenderness observed. Pinna - Right - no drainage observed, no generalized tenderness observed. Nose and Sinuses External Inspection of the Nose - no destructive lesion observed. Inspection of the nares - Left - quiet respiration. Inspection of the nares - Right - quiet respiration. Mouth and Throat Lips - Upper Lip - no fissures observed, no pallor noted. Lower Lip - no fissures observed, no pallor noted. Nasopharynx - no discharge present. Oral Cavity/Oropharynx - Tongue - no dryness observed. Oral Mucosa - no cyanosis observed. Hypopharynx - no evidence of airway distress observed.  Chest and Lung Exam Inspection Movements - Normal and Symmetrical. Accessory muscles - No use of accessory muscles in breathing. Palpation Palpation of the chest reveals - Non-tender. Auscultation Breath sounds - Normal and Clear.  Cardiovascular Auscultation Rhythm - Regular. Murmurs & Other Heart Sounds - Auscultation of the heart reveals - No Murmurs and No Systolic Clicks.  Abdomen Inspection Inspection of the abdomen reveals - No Visible peristalsis and No Abnormal pulsations. Umbilicus - No Bleeding, No Urine drainage. Palpation/Percussion Palpation and Percussion of the abdomen reveal - Soft, Non Tender, No Rebound tenderness, No Rigidity (guarding) and No Cutaneous hyperesthesia. Note: Abdomen soft. Not severely distended. Mild supraumbilical diastases recti. Port site incisions classic for laparoscopic cholecystectomy.  5 x 5 cm subcutaneous mass in epigastric region right lateral to the epigastric port site. Sensitive to touch. Consistent with incarcerated hernia. No  umbilical or other anterior abdominal wall hernias  Female Genitourinary Sexual Maturity Tanner 5 - Adult hair pattern. Note: No vaginal bleeding nor discharge  Peripheral Vascular Upper Extremity Inspection - Left - No Cyanotic nailbeds - Left, Not Ischemic. Inspection -  Right - No Cyanotic nailbeds - Right, Not Ischemic.  Neurologic Neurologic evaluation reveals -normal attention span and ability to concentrate, able to name objects and repeat phrases. Appropriate fund of knowledge , normal sensation and normal coordination. Mental Status Affect - not angry, not paranoid. Cranial Nerves-Normal Bilaterally. Gait-Normal.  Neuropsychiatric Mental status exam performed with findings of-able to articulate well with normal speech/language, rate, volume and coherence, thought content normal with ability to perform basic computations and apply abstract reasoning and no evidence of hallucinations, delusions, obsessions or homicidal/suicidal ideation. The patient's mood and affect are described as -normal. Judgment and Insight-the patient displays appropriate judgment regarding every day activities. Thought Processes/Cognitive Function-long term memory impaired(Patient think she still is in [place] area. Has been living in assisted living in town for almost a year. Could not recall her prior cholecystectomy first but then remembered.).  Musculoskeletal Global Assessment Spine, Ribs and Pelvis - no instability, subluxation or laxity. Right Upper Extremity - no instability, subluxation or laxity.  Lymphatic Head & Neck  General Head & Neck Lymphatics: Bilateral - Description - No Localized lymphadenopathy. Axillary  General Axillary Region: Bilateral - Description - No Localized lymphadenopathy. Femoral & Inguinal  Generalized Femoral & Inguinal Lymphatics: Left - Description - No Localized lymphadenopathy. Right - Description - No Localized lymphadenopathy.     Assessment & Plan Jillian Hector MD; 11/11/2018 2:39 PM)  Fatima Blank HERNIA, INCARCERATED (K43.0) Impression: Pleasant woman with epigastric 5x5cm mass not reducible near port site incision. Ultrasound notes small bowel within it. Concerning for incarcerated incisional hernia.  Because her small bowel within it and is incarcerated, I do not think this can just be observed. Would plan laparoscopic reduction and underlay repair with mesh to minimize recurrence.  I think her cardiopulmonary risks are low. However she does have some dementia most reliably related to her prior heavy alcohol use. Otherwise seems pretty stable and functional even though she has some short-term memory issues. I had discussion with the patient and especially her husband. She her dementia could get worse with this. But hopefully because it's a quicker case it is not too likely. Usually this is an outpatient surgery but she may need to stay overnight or perhaps longer depending on her mental status. It is sensitive to touch and not reducible. Patient and son are leaning toward surgery.   PREOP - [facility] - ENCOUNTER FOR PREOPERATIVE EXAMINATION FOR GENERAL SURGICAL PROCEDURE (Z01.818)  Current Plans You are being scheduled for surgery- Our schedulers will call you.  You should hear from our office's scheduling department within 5 working days about the location, date, and time of surgery. We try to make accommodations for patient's preferences in scheduling surgery, but sometimes the OR schedule or the surgeon's schedule prevents [place] from making those accommodations.  If you have not heard from our office [phone]) in 5 working days, call the office and ask for your surgeon's nurse.  If you have other questions about your diagnosis, plan, or surgery, call the office and ask for your surgeon's nurse.  Written instructions provided The anatomy & physiology of the abdominal wall was discussed. The pathophysiology of  hernias was discussed. Natural history risks without surgery including progeressive enlargement, pain, incarceration, & strangulation was discussed. Contributors to complications such as smoking, obesity, diabetes, prior surgery, etc were discussed.  I feel the risks of no intervention will lead to serious problems that outweigh the operative risks; therefore, I recommended surgery to reduce and repair the hernia. I explained laparoscopic techniques  with possible need for an open approach. I noted the probable use of mesh to patch and/or buttress the hernia repair  Risks such as bleeding, infection, abscess, need for further treatment, heart attack, death, and other risks were discussed. I noted a good likelihood this will help address the problem. Goals of post-operative recovery were discussed as well. Possibility that this will not correct all symptoms was explained. I stressed the importance of low-impact activity, aggressive pain control, avoiding constipation, & not pushing through pain to minimize risk of post-operative chronic pain or injury. Possibility of reherniation especially with smoking, obesity, diabetes, immunosuppression, and other health conditions was discussed. We will work to minimize complications.  An educational handout further explaining the pathology & treatment options was given as well. Questions were answered. The patient & her son express understanding & wishes to proceed with surgery.  Pt Education - [facility] Hernia Post-Op HCI (Franki Alcaide): discussed with patient and provided information. Pt Education - [facility] Pain Control (Audel Coakley) Pt Education - Pamphlet Given - Laparoscopic Hernia Repair: discussed with patient and provided information. Pt Education - [facility] Mesh education: discussed with patient and provided information.  Jillian Hector, MD, FACS, MASCRS Gastrointestinal and Minimally Invasive Surgery    [address], [place], [place] [address]  [phone] Main / Paging [phone] Fax

## 2019-01-27 ENCOUNTER — Other Ambulatory Visit (HOSPITAL_COMMUNITY): Payer: Medicare Other

## 2019-01-27 ENCOUNTER — Other Ambulatory Visit (HOSPITAL_COMMUNITY)
Admission: RE | Admit: 2019-01-27 | Discharge: 2019-01-27 | Disposition: A | Discharge status: Home / Self Care | Payer: Medicare Other | Source: Ambulatory Visit | Attending: Surgery | Admitting: Surgery

## 2019-01-27 DIAGNOSIS — Z20828 Contact with and (suspected) exposure to other viral communicable diseases: Secondary | ICD-10-CM | POA: Diagnosis not present

## 2019-01-27 DIAGNOSIS — K432 Incisional hernia without obstruction or gangrene: Secondary | ICD-10-CM | POA: Diagnosis not present

## 2019-01-27 DIAGNOSIS — Z01812 Encounter for preprocedural laboratory examination: Secondary | ICD-10-CM | POA: Diagnosis present

## 2019-01-27 DIAGNOSIS — K439 Ventral hernia without obstruction or gangrene: Secondary | ICD-10-CM | POA: Diagnosis not present

## 2019-01-28 ENCOUNTER — Encounter (HOSPITAL_COMMUNITY): Payer: Self-pay

## 2019-01-28 NOTE — Patient Instructions (Addendum)
DUE TO COVID-19 ONLY ONE VISITOR IS ALLOWED TO COME WITH YOU AND STAY IN THE WAITING ROOM ONLY DURING PRE OP AND PROCEDURE. THE ONE VISITOR MAY VISIT WITH YOU IN YOUR PRIVATE ROOM DURING VISITING HOURS ONLY!!   COVID SWAB TESTING COMPLETED ON: Monday, Sept. 21, 2020  (Must self quarantine after testing. Follow instructions on handout.)             Your procedure is scheduled on: Thursday, Sept. 24, 2020   Report to [facility] Main  Entrance    Report to admitting at 1:15 PM   Call this number if you have problems the morning of surgery [phone]   Do not eat food:After Midnight.   May have liquids until 9:15AM day of surgery   CLEAR LIQUID DIET  Foods Allowed                                                                     Foods Excluded  Water, Black Coffee and tea, regular and decaf                             liquids that you cannot  Plain Jell-O in any flavor  (No red)                                           see through such as: Fruit ices (not with fruit pulp)                                     milk, soups, orange juice  Iced Popsicles (No red)                                    All solid food Carbonated beverages, regular and diet                                    Apple juices Sports drinks like Gatorade (No red) Lightly seasoned clear broth or consume(fat free) Sugar, honey syrup  Sample Menu Breakfast                                Lunch                                     Supper Cranberry juice                    Beef broth                            Chicken broth Jell-O  Grape juice                           Apple juice Coffee or tea                        Jell-O                                      Popsicle                                                Coffee or tea                        Coffee or tea   Brush your teeth the morning of surgery.   Do NOT smoke after Midnight   Take these medicines the morning  of surgery with A SIP OF WATER: Escitalopram, Keppra, Loratadine, Lorazepam   May use Flonase day of surgery                               You may not have any metal on your body including hair pins, jewelry, and body piercings             Do not wear make-up, lotions, powders, perfumes/cologne, or deodorant             Do not wear nail polish.  Do not shave  48 hours prior to surgery.                Do not bring valuables to the hospital. [place].   Contacts, dentures or bridgework may not be worn into surgery.    Patients discharged the day of surgery will not be allowed to drive home.   Special Instructions: Bring a copy of your healthcare power of attorney and living will documents         the day of surgery if you haven't scanned them in before.              Please read over the following fact sheets you were given:  Faxed to [facility] Phone: [phone] Fax: [phone] 01/29/2019

## 2019-01-29 ENCOUNTER — Encounter (HOSPITAL_COMMUNITY)
Admission: RE | Admit: 2019-01-29 | Discharge: 2019-01-29 | Disposition: A | Discharge status: Home / Self Care | Payer: Medicare Other | Source: Ambulatory Visit | Attending: Family Medicine | Admitting: Family Medicine

## 2019-01-29 ENCOUNTER — Encounter (HOSPITAL_COMMUNITY): Payer: Self-pay

## 2019-01-29 HISTORY — DX: Alcohol dependence with alcohol-induced persisting dementia: F10.27

## 2019-01-29 HISTORY — DX: Essential (primary) hypertension: I10

## 2019-01-29 HISTORY — DX: Other seasonal allergic rhinitis: J30.2

## 2019-01-29 HISTORY — DX: Personal history of peptic ulcer disease: Z87.11

## 2019-01-29 HISTORY — DX: Ventral hernia without obstruction or gangrene: K43.9

## 2019-01-29 LAB — NOVEL CORONAVIRUS, NAA (HOSP ORDER, SEND-OUT TO REF LAB; TAT 18-24 HRS): SARS-CoV-2, NAA: NOT DETECTED

## 2019-01-29 MED ORDER — BUPIVACAINE LIPOSOME 1.3 % IJ SUSP
20.0000 mL | INTRAMUSCULAR | Status: DC
  Filled 2019-01-29: qty 20

## 2019-01-29 NOTE — Progress Notes (Signed)
SPOKE W/  Coral Ceo     SCREENING SYMPTOMS OF COVID 19:   COUGH--NO  RUNNY NOSE--- NO  SORE THROAT---NO  NASAL CONGESTION----NO  SNEEZING----NO  SHORTNESS OF BREATH---NO  DIFFICULTY BREATHING---NO  TEMP >100.0 -----NO  UNEXPLAINED BODY ACHES------NO  CHILLS -------- NO  HEADACHES ---------NO  LOSS OF SMELL/ TASTE --------NO    HAVE YOU OR ANY FAMILY MEMBER TRAVELLED PAST 14 DAYS OUT OF THE   COUNTY---NO STATE----NO COUNTRY----NO  HAVE YOU OR ANY FAMILY MEMBER BEEN EXPOSED TO ANYONE WITH COVID 19? NO

## 2019-01-29 NOTE — Anesthesia Preprocedure Evaluation (Addendum)
Anesthesia Evaluation  Patient identified by MRN, date of birth, ID band Patient awake    Reviewed: Allergy & Precautions, NPO status , Patient's Chart, lab work & pertinent test results  Airway Mallampati: II  TM Distance: >3 FB Neck ROM: Full    Dental no notable dental hx. (+) Teeth Intact, Dental Advisory Given   Pulmonary neg pulmonary ROS,    Pulmonary exam normal breath sounds clear to auscultation       Cardiovascular Exercise Tolerance: Good hypertension, Pt. on medications Normal cardiovascular exam Rhythm:Regular Rate:Normal  6/17 Echo  Left ventricle: The cavity size was normal. There was mild   concentric hypertrophy. Systolic function was normal. The   estimated ejection fraction was in the range of 55% to 60%. Wall   motion was normal; there were no regional wall motion   abnormalities. Left ventricular diastolic function parameters   were normal.   Neuro/Psych Seizures -,  PSYCHIATRIC DISORDERS Anxiety Dementia CVA    GI/Hepatic Neg liver ROS, PUD,   Endo/Other  Hypothyroidism   Renal/GU   negative genitourinary   Musculoskeletal negative musculoskeletal ROS (+)   Abdominal   Peds  Hematology  (+) anemia ,   Anesthesia Other Findings   Reproductive/Obstetrics                            Anesthesia Physical Anesthesia Plan  ASA: II  Anesthesia Plan: General   Post-op Pain Management:    Induction: Intravenous  PONV Risk Score and Plan: 3 and Treatment may vary due to age or medical condition, Ondansetron and Dexamethasone  Airway Management Planned: Oral ETT  Additional Equipment: None  Intra-op Plan:   Post-operative Plan: Extubation in OR  Informed Consent: I have reviewed the patients History and Physical, chart, labs and discussed the procedure including the risks, benefits and alternatives for the proposed anesthesia with the patient or authorized  representative who has indicated his/her understanding and acceptance.     Dental advisory given  Plan Discussed with: CRNA and Anesthesiologist  Anesthesia Plan Comments: (GA w lidocaine infusion )       Anesthesia Quick Evaluation

## 2019-01-30 ENCOUNTER — Ambulatory Visit (HOSPITAL_COMMUNITY): Payer: Medicare Other

## 2019-01-30 ENCOUNTER — Encounter (HOSPITAL_COMMUNITY)
Admission: RE | Disposition: A | Discharge status: Home / Self Care | Payer: Self-pay | Source: Home / Self Care | Attending: Surgery

## 2019-01-30 ENCOUNTER — Observation Stay (HOSPITAL_COMMUNITY)
Admission: RE | Admit: 2019-01-30 | Discharge: 2019-01-31 | Disposition: A | Discharge status: Home / Self Care | Payer: Medicare Other | Attending: Surgery | Admitting: Surgery

## 2019-01-30 ENCOUNTER — Encounter (HOSPITAL_COMMUNITY): Payer: Self-pay

## 2019-01-30 ENCOUNTER — Other Ambulatory Visit: Payer: Self-pay

## 2019-01-30 DIAGNOSIS — F0390 Unspecified dementia without behavioral disturbance: Secondary | ICD-10-CM | POA: Insufficient documentation

## 2019-01-30 DIAGNOSIS — C73 Malignant neoplasm of thyroid gland: Secondary | ICD-10-CM | POA: Diagnosis not present

## 2019-01-30 DIAGNOSIS — D649 Anemia, unspecified: Secondary | ICD-10-CM | POA: Diagnosis not present

## 2019-01-30 DIAGNOSIS — G40909 Epilepsy, unspecified, not intractable, without status epilepticus: Secondary | ICD-10-CM | POA: Diagnosis not present

## 2019-01-30 DIAGNOSIS — R2681 Unsteadiness on feet: Secondary | ICD-10-CM | POA: Diagnosis present

## 2019-01-30 DIAGNOSIS — R413 Other amnesia: Secondary | ICD-10-CM | POA: Diagnosis present

## 2019-01-30 DIAGNOSIS — Z79899 Other long term (current) drug therapy: Secondary | ICD-10-CM | POA: Insufficient documentation

## 2019-01-30 DIAGNOSIS — F1020 Alcohol dependence, uncomplicated: Secondary | ICD-10-CM | POA: Diagnosis present

## 2019-01-30 DIAGNOSIS — Z8673 Personal history of transient ischemic attack (TIA), and cerebral infarction without residual deficits: Secondary | ICD-10-CM | POA: Diagnosis not present

## 2019-01-30 DIAGNOSIS — E039 Hypothyroidism, unspecified: Secondary | ICD-10-CM | POA: Insufficient documentation

## 2019-01-30 DIAGNOSIS — R569 Unspecified convulsions: Secondary | ICD-10-CM

## 2019-01-30 DIAGNOSIS — F419 Anxiety disorder, unspecified: Secondary | ICD-10-CM | POA: Insufficient documentation

## 2019-01-30 DIAGNOSIS — I10 Essential (primary) hypertension: Secondary | ICD-10-CM | POA: Insufficient documentation

## 2019-01-30 DIAGNOSIS — Z23 Encounter for immunization: Secondary | ICD-10-CM | POA: Diagnosis not present

## 2019-01-30 DIAGNOSIS — K430 Incisional hernia with obstruction, without gangrene: Secondary | ICD-10-CM | POA: Diagnosis present

## 2019-01-30 DIAGNOSIS — Z7951 Long term (current) use of inhaled steroids: Secondary | ICD-10-CM | POA: Insufficient documentation

## 2019-01-30 HISTORY — PX: VENTRAL HERNIA REPAIR: SHX424

## 2019-01-30 HISTORY — DX: Syncope and collapse: R55

## 2019-01-30 HISTORY — DX: Chronic or unspecified gastric ulcer with hemorrhage: K25.4

## 2019-01-30 LAB — CBC WITH DIFFERENTIAL/PLATELET
Abs Immature Granulocytes: 0.02 10*3/uL (ref 0.00–0.07)
Basophils Absolute: 0 10*3/uL (ref 0.0–0.1)
Basophils Relative: 1 %
Eosinophils Absolute: 0.1 10*3/uL (ref 0.0–0.5)
Eosinophils Relative: 2 %
HCT: 41.7 % (ref 36.0–46.0)
Hemoglobin: 13.8 g/dL (ref 12.0–15.0)
Immature Granulocytes: 0 %
Lymphocytes Relative: 30 %
Lymphs Abs: 1.8 10*3/uL (ref 0.7–4.0)
MCH: 30.5 pg (ref 26.0–34.0)
MCHC: 33.1 g/dL (ref 30.0–36.0)
MCV: 92.3 fL (ref 80.0–100.0)
Monocytes Absolute: 0.4 10*3/uL (ref 0.1–1.0)
Monocytes Relative: 7 %
Neutro Abs: 3.6 10*3/uL (ref 1.7–7.7)
Neutrophils Relative %: 60 %
Platelets: 251 10*3/uL (ref 150–400)
RBC: 4.52 MIL/uL (ref 3.87–5.11)
RDW: 12 % (ref 11.5–15.5)
WBC: 6 10*3/uL (ref 4.0–10.5)
nRBC: 0 % (ref 0.0–0.2)

## 2019-01-30 LAB — BASIC METABOLIC PANEL
Anion gap: 11 (ref 5–15)
BUN: 9 mg/dL (ref 8–23)
CO2: 19 mmol/L — ABNORMAL LOW (ref 22–32)
Calcium: 9.6 mg/dL (ref 8.9–10.3)
Chloride: 109 mmol/L (ref 98–111)
Creatinine, Ser: 0.73 mg/dL (ref 0.44–1.00)
GFR calc Af Amer: >60 mL/min (ref >60)
GFR calc non Af Amer: >60 mL/min (ref >60)
Glucose, Bld: 94 mg/dL (ref 70–99)
Potassium: 4.3 mmol/L (ref 3.5–5.1)
Sodium: 139 mmol/L (ref 135–145)

## 2019-01-30 SURGERY — REPAIR, HERNIA, VENTRAL, LAPAROSCOPIC
Anesthesia: General

## 2019-01-30 MED ORDER — PROCHLORPERAZINE EDISYLATE 10 MG/2ML IJ SOLN: 5.0000 mg | Freq: Four times a day (QID) | INTRAMUSCULAR | Status: DC | PRN

## 2019-01-30 MED ORDER — KETAMINE HCL 10 MG/ML IJ SOLN
INTRAMUSCULAR | Status: AC
  Filled 2019-01-30: qty 1

## 2019-01-30 MED ORDER — ACETAMINOPHEN 10 MG/ML IV SOLN: 1000.0000 mg | Freq: Once | INTRAVENOUS | Status: DC | PRN

## 2019-01-30 MED ORDER — FENTANYL CITRATE (PF) 100 MCG/2ML IJ SOLN
INTRAMUSCULAR | Status: AC
  Filled 2019-01-30: qty 2

## 2019-01-30 MED ORDER — SODIUM CHLORIDE 0.9% FLUSH: 3.0000 mL | INTRAVENOUS | Status: DC | PRN

## 2019-01-30 MED ORDER — ONDANSETRON 4 MG PO TBDP: 4.0000 mg | ORAL_TABLET | Freq: Four times a day (QID) | ORAL | Status: DC | PRN

## 2019-01-30 MED ORDER — FLUTICASONE FUROATE 27.5 MCG/SPRAY NA SUSP: 2.0000 | Freq: Every day | NASAL | Status: DC

## 2019-01-30 MED ORDER — KETAMINE HCL 10 MG/ML IJ SOLN
INTRAMUSCULAR | Status: DC | PRN
  Administered 2019-01-30: 25 mg via INTRAVENOUS

## 2019-01-30 MED ORDER — LIDOCAINE 2% (20 MG/ML) 5 ML SYRINGE
INTRAMUSCULAR | Status: AC
  Filled 2019-01-30: qty 5

## 2019-01-30 MED ORDER — SODIUM CHLORIDE 0.9% FLUSH: 3.0000 mL | Freq: Two times a day (BID) | INTRAVENOUS | Status: DC

## 2019-01-30 MED ORDER — LORATADINE 10 MG PO TABS
10.0000 mg | ORAL_TABLET | Freq: Every day | ORAL | Status: DC
  Administered 2019-01-31: 10 mg via ORAL
  Filled 2019-01-30: qty 1

## 2019-01-30 MED ORDER — LACTATED RINGERS IV SOLN: 1000.0000 mL | Freq: Three times a day (TID) | INTRAVENOUS | Status: DC | PRN

## 2019-01-30 MED ORDER — ONDANSETRON HCL 4 MG/2ML IJ SOLN
INTRAMUSCULAR | Status: DC | PRN
  Administered 2019-01-30: 4 mg via INTRAVENOUS

## 2019-01-30 MED ORDER — FLUTICASONE PROPIONATE 50 MCG/ACT NA SUSP
2.0000 | Freq: Every day | NASAL | Status: DC
  Filled 2019-01-30: qty 16

## 2019-01-30 MED ORDER — ZOLPIDEM TARTRATE 5 MG PO TABS: 5.0000 mg | ORAL_TABLET | Freq: Every evening | ORAL | Status: DC | PRN

## 2019-01-30 MED ORDER — ENALAPRILAT 1.25 MG/ML IV SOLN
0.6250 mg | Freq: Four times a day (QID) | INTRAVENOUS | Status: DC | PRN
  Filled 2019-01-30: qty 1

## 2019-01-30 MED ORDER — FENTANYL CITRATE (PF) 100 MCG/2ML IJ SOLN
25.0000 ug | INTRAMUSCULAR | Status: DC | PRN
  Administered 2019-01-30: 25 ug via INTRAVENOUS
  Administered 2019-01-30: 50 ug via INTRAVENOUS

## 2019-01-30 MED ORDER — PSYLLIUM 95 % PO PACK
1.0000 | PACK | Freq: Two times a day (BID) | ORAL | Status: DC
  Administered 2019-01-30: 1 via ORAL
  Filled 2019-01-30: qty 1

## 2019-01-30 MED ORDER — ONDANSETRON HCL 4 MG/2ML IJ SOLN
INTRAMUSCULAR | Status: AC
  Filled 2019-01-30: qty 2

## 2019-01-30 MED ORDER — PNEUMOCOCCAL VAC POLYVALENT 25 MCG/0.5ML IJ INJ
0.5000 mL | INJECTION | INTRAMUSCULAR | Status: AC
  Administered 2019-01-31: 0.5 mL via INTRAMUSCULAR
  Filled 2019-01-30: qty 0.5

## 2019-01-30 MED ORDER — LEVETIRACETAM 500 MG PO TABS
500.0000 mg | ORAL_TABLET | Freq: Two times a day (BID) | ORAL | Status: DC
  Administered 2019-01-30 – 2019-01-31 (×2): 500 mg via ORAL
  Filled 2019-01-30 (×2): qty 1

## 2019-01-30 MED ORDER — LACTATED RINGERS IV BOLUS: 1000.0000 mL | Freq: Three times a day (TID) | INTRAVENOUS | Status: DC | PRN

## 2019-01-30 MED ORDER — DEXAMETHASONE SODIUM PHOSPHATE 10 MG/ML IJ SOLN
INTRAMUSCULAR | Status: DC | PRN
  Administered 2019-01-30: 5 mg via INTRAVENOUS

## 2019-01-30 MED ORDER — LIDOCAINE 20MG/ML (2%) 15 ML SYRINGE OPTIME
INTRAMUSCULAR | Status: DC | PRN
  Administered 2019-01-30: 1.5 mg/kg/h via INTRAVENOUS

## 2019-01-30 MED ORDER — FENTANYL CITRATE (PF) 250 MCG/5ML IJ SOLN
INTRAMUSCULAR | Status: DC | PRN
  Administered 2019-01-30: 50 ug via INTRAVENOUS
  Administered 2019-01-30: 100 ug via INTRAVENOUS
  Administered 2019-01-30: 50 ug via INTRAVENOUS

## 2019-01-30 MED ORDER — BUPIVACAINE HCL (PF) 0.25 % IJ SOLN
INTRAMUSCULAR | Status: AC
  Filled 2019-01-30: qty 60

## 2019-01-30 MED ORDER — LIDOCAINE HCL 2 % IJ SOLN
INTRAMUSCULAR | Status: AC
  Filled 2019-01-30: qty 20

## 2019-01-30 MED ORDER — ACETAMINOPHEN 500 MG PO TABS
1000.0000 mg | ORAL_TABLET | Freq: Three times a day (TID) | ORAL | Status: DC
  Administered 2019-01-30 – 2019-01-31 (×2): 1000 mg via ORAL
  Filled 2019-01-30 (×2): qty 2

## 2019-01-30 MED ORDER — ONDANSETRON HCL 4 MG/2ML IJ SOLN: 4.0000 mg | Freq: Four times a day (QID) | INTRAMUSCULAR | Status: DC | PRN

## 2019-01-30 MED ORDER — ESCITALOPRAM OXALATE 20 MG PO TABS
20.0000 mg | ORAL_TABLET | Freq: Every day | ORAL | Status: DC
  Administered 2019-01-30 – 2019-01-31 (×2): 20 mg via ORAL
  Filled 2019-01-30 (×2): qty 1

## 2019-01-30 MED ORDER — BISACODYL 10 MG RE SUPP: 10.0000 mg | Freq: Every day | RECTAL | Status: DC | PRN

## 2019-01-30 MED ORDER — LORAZEPAM 0.5 MG PO TABS
0.5000 mg | ORAL_TABLET | ORAL | Status: DC
  Administered 2019-01-31: 0.5 mg via ORAL
  Filled 2019-01-30: qty 1

## 2019-01-30 MED ORDER — GUAIFENESIN 100 MG/5ML PO SOLN: 200.0000 mg | ORAL | Status: DC | PRN

## 2019-01-30 MED ORDER — BUPIVACAINE HCL (PF) 0.25 % IJ SOLN
INTRAMUSCULAR | Status: DC | PRN
  Administered 2019-01-30: 40 mL

## 2019-01-30 MED ORDER — SODIUM CHLORIDE 0.9 % IV SOLN: 250.0000 mL | INTRAVENOUS | Status: DC | PRN

## 2019-01-30 MED ORDER — ONDANSETRON HCL 4 MG/2ML IJ SOLN: 4.0000 mg | Freq: Once | INTRAMUSCULAR | Status: DC | PRN

## 2019-01-30 MED ORDER — SUGAMMADEX SODIUM 200 MG/2ML IV SOLN
INTRAVENOUS | Status: DC | PRN
  Administered 2019-01-30: 150 mg via INTRAVENOUS

## 2019-01-30 MED ORDER — ENOXAPARIN SODIUM 40 MG/0.4ML ~~LOC~~ SOLN
40.0000 mg | SUBCUTANEOUS | Status: DC
  Administered 2019-01-31: 08:00:00 40 mg via SUBCUTANEOUS
  Filled 2019-01-30: qty 0.4

## 2019-01-30 MED ORDER — GABAPENTIN 300 MG PO CAPS
300.0000 mg | ORAL_CAPSULE | Freq: Two times a day (BID) | ORAL | Status: DC
  Administered 2019-01-30 – 2019-01-31 (×2): 300 mg via ORAL
  Filled 2019-01-30 (×2): qty 1

## 2019-01-30 MED ORDER — LORAZEPAM 0.5 MG PO TABS: 0.5000 mg | ORAL_TABLET | Freq: Four times a day (QID) | ORAL | Status: DC | PRN

## 2019-01-30 MED ORDER — ROCURONIUM BROMIDE 10 MG/ML (PF) SYRINGE
PREFILLED_SYRINGE | INTRAVENOUS | Status: DC | PRN
  Administered 2019-01-30: 10 mg via INTRAVENOUS
  Administered 2019-01-30: 5 mg via INTRAVENOUS
  Administered 2019-01-30: 50 mg via INTRAVENOUS

## 2019-01-30 MED ORDER — BUPIVACAINE LIPOSOME 1.3 % IJ SUSP
INTRAMUSCULAR | Status: DC | PRN
  Administered 2019-01-30: 20 mL

## 2019-01-30 MED ORDER — SIMETHICONE 80 MG PO CHEW: 40.0000 mg | CHEWABLE_TABLET | Freq: Four times a day (QID) | ORAL | Status: DC | PRN

## 2019-01-30 MED ORDER — DIPHENHYDRAMINE HCL 50 MG/ML IJ SOLN: 12.5000 mg | Freq: Four times a day (QID) | INTRAMUSCULAR | Status: DC | PRN

## 2019-01-30 MED ORDER — POLYETHYLENE GLYCOL 3350 17 G PO PACK: 17.0000 g | PACK | Freq: Every day | ORAL | Status: DC | PRN

## 2019-01-30 MED ORDER — 0.9 % SODIUM CHLORIDE (POUR BTL) OPTIME
TOPICAL | Status: DC | PRN
  Administered 2019-01-30: 1000 mL

## 2019-01-30 MED ORDER — INFLUENZA VAC A&B SA ADJ QUAD 0.5 ML IM PRSY
0.5000 mL | PREFILLED_SYRINGE | INTRAMUSCULAR | Status: AC
  Administered 2019-01-31: 10:00:00 0.5 mL via INTRAMUSCULAR
  Filled 2019-01-30: qty 0.5

## 2019-01-30 MED ORDER — METHOCARBAMOL 500 MG PO TABS
1000.0000 mg | ORAL_TABLET | Freq: Four times a day (QID) | ORAL | Status: DC | PRN
  Administered 2019-01-30 – 2019-01-31 (×2): 1000 mg via ORAL
  Filled 2019-01-30 (×2): qty 2

## 2019-01-30 MED ORDER — TRAMADOL HCL 50 MG PO TABS: 50.0000 mg | ORAL_TABLET | Freq: Four times a day (QID) | ORAL | 0 refills | Status: DC | PRN

## 2019-01-30 MED ORDER — LIP MEDEX EX OINT
1.0000 "application " | TOPICAL_OINTMENT | Freq: Two times a day (BID) | CUTANEOUS | Status: DC
  Administered 2019-01-30: 1 via TOPICAL
  Filled 2019-01-30: qty 7

## 2019-01-30 MED ORDER — LACTATED RINGERS IV SOLN
INTRAVENOUS | Status: DC
  Administered 2019-01-30: 13:00:00 via INTRAVENOUS

## 2019-01-30 MED ORDER — SUGAMMADEX SODIUM 500 MG/5ML IV SOLN
INTRAVENOUS | Status: AC
  Filled 2019-01-30: qty 5

## 2019-01-30 MED ORDER — MAGIC MOUTHWASH
15.0000 mL | Freq: Four times a day (QID) | ORAL | Status: DC | PRN
  Filled 2019-01-30: qty 15

## 2019-01-30 MED ORDER — IBUPROFEN 200 MG PO TABS
400.0000 mg | ORAL_TABLET | Freq: Three times a day (TID) | ORAL | Status: DC | PRN
  Administered 2019-01-31: 400 mg via ORAL
  Filled 2019-01-30: qty 2

## 2019-01-30 MED ORDER — OXYCODONE HCL 5 MG PO TABS
5.0000 mg | ORAL_TABLET | ORAL | Status: DC | PRN
  Administered 2019-01-31: 5 mg via ORAL
  Filled 2019-01-30: qty 1

## 2019-01-30 MED ORDER — METHOCARBAMOL 1000 MG/10ML IJ SOLN
1000.0000 mg | Freq: Four times a day (QID) | INTRAVENOUS | Status: DC | PRN
  Filled 2019-01-30 (×2): qty 10

## 2019-01-30 MED ORDER — LIDOCAINE 2% (20 MG/ML) 5 ML SYRINGE
INTRAMUSCULAR | Status: DC | PRN
  Administered 2019-01-30: 75 mg via INTRAVENOUS

## 2019-01-30 MED ORDER — ACETAMINOPHEN 500 MG PO TABS
1000.0000 mg | ORAL_TABLET | ORAL | Status: AC
  Administered 2019-01-30: 1000 mg via ORAL
  Filled 2019-01-30: qty 2

## 2019-01-30 MED ORDER — DEXAMETHASONE SODIUM PHOSPHATE 10 MG/ML IJ SOLN
INTRAMUSCULAR | Status: AC
  Filled 2019-01-30: qty 1

## 2019-01-30 MED ORDER — PROPOFOL 10 MG/ML IV BOLUS
INTRAVENOUS | Status: DC | PRN
  Administered 2019-01-30: 90 mg via INTRAVENOUS
  Administered 2019-01-30: 30 mg via INTRAVENOUS

## 2019-01-30 MED ORDER — DIPHENHYDRAMINE HCL 12.5 MG/5ML PO ELIX: 12.5000 mg | ORAL_SOLUTION | Freq: Four times a day (QID) | ORAL | Status: DC | PRN

## 2019-01-30 MED ORDER — GABAPENTIN 300 MG PO CAPS
300.0000 mg | ORAL_CAPSULE | ORAL | Status: AC
  Administered 2019-01-30: 300 mg via ORAL
  Filled 2019-01-30: qty 1

## 2019-01-30 MED ORDER — PROCHLORPERAZINE MALEATE 10 MG PO TABS: 10.0000 mg | ORAL_TABLET | Freq: Four times a day (QID) | ORAL | Status: DC | PRN

## 2019-01-30 MED ORDER — ACETAMINOPHEN 500 MG PO TABS: 1000.0000 mg | ORAL_TABLET | Freq: Three times a day (TID) | ORAL | Status: DC | PRN

## 2019-01-30 MED ORDER — STERILE WATER FOR IRRIGATION IR SOLN
Status: DC | PRN
  Administered 2019-01-30: 1000 mL

## 2019-01-30 MED ORDER — METOPROLOL TARTRATE 5 MG/5ML IV SOLN: 5.0000 mg | Freq: Four times a day (QID) | INTRAVENOUS | Status: DC | PRN

## 2019-01-30 MED ORDER — CEFAZOLIN SODIUM-DEXTROSE 2-4 GM/100ML-% IV SOLN
2.0000 g | INTRAVENOUS | Status: AC
  Administered 2019-01-30: 16:00:00 2 g via INTRAVENOUS
  Filled 2019-01-30: qty 100

## 2019-01-30 MED ORDER — HYDROMORPHONE HCL 1 MG/ML IJ SOLN
0.5000 mg | INTRAMUSCULAR | Status: DC | PRN
  Administered 2019-01-30: 1 mg via INTRAVENOUS
  Filled 2019-01-30: qty 1

## 2019-01-30 MED ORDER — ROCURONIUM BROMIDE 10 MG/ML (PF) SYRINGE
PREFILLED_SYRINGE | INTRAVENOUS | Status: AC
  Filled 2019-01-30: qty 10

## 2019-01-30 SURGICAL SUPPLY — 41 items
APPLIER CLIP 5 13 M/L LIGAMAX5 (MISCELLANEOUS)
BINDER ABDOMINAL 12 ML 46-62 (SOFTGOODS) ×3 IMPLANT
CABLE HIGH FREQUENCY MONO STRZ (ELECTRODE) ×3 IMPLANT
CHLORAPREP W/TINT 26 (MISCELLANEOUS) ×3 IMPLANT
CLIP APPLIE 5 13 M/L LIGAMAX5 (MISCELLANEOUS) IMPLANT
CLOSURE WOUND 1/2 X4 (GAUZE/BANDAGES/DRESSINGS) ×2
COVER SURGICAL LIGHT HANDLE (MISCELLANEOUS) ×3 IMPLANT
COVER WAND RF STERILE (DRAPES) ×3 IMPLANT
DECANTER SPIKE VIAL GLASS SM (MISCELLANEOUS) ×3 IMPLANT
DEVICE SECURE STRAP 25 ABSORB (INSTRUMENTS) ×3 IMPLANT
DEVICE TROCAR PUNCTURE CLOSURE (ENDOMECHANICALS) ×3 IMPLANT
DRAPE WARM FLUID 44X44 (DRAPES) ×3 IMPLANT
DRSG TEGADERM 2-3/8X2-3/4 SM (GAUZE/BANDAGES/DRESSINGS) ×9 IMPLANT
DRSG TEGADERM 4X4.75 (GAUZE/BANDAGES/DRESSINGS) ×3 IMPLANT
ELECT REM PT RETURN 15FT ADLT (MISCELLANEOUS) ×3 IMPLANT
GAUZE SPONGE 2X2 8PLY STRL LF (GAUZE/BANDAGES/DRESSINGS) ×2 IMPLANT
GLOVE ECLIPSE 8.0 STRL XLNG CF (GLOVE) ×3 IMPLANT
GLOVE INDICATOR 8.0 STRL GRN (GLOVE) ×3 IMPLANT
GOWN STRL REUS W/TWL XL LVL3 (GOWN DISPOSABLE) ×6 IMPLANT
IRRIG SUCT STRYKERFLOW 2 WTIP (MISCELLANEOUS)
IRRIGATION SUCT STRKRFLW 2 WTP (MISCELLANEOUS) IMPLANT
KIT BASIN OR (CUSTOM PROCEDURE TRAY) ×3 IMPLANT
KIT TURNOVER KIT A (KITS) IMPLANT
MARKER SKIN DUAL TIP RULER LAB (MISCELLANEOUS) ×3 IMPLANT
MESH VENTRALIGHT ST 6X8 (Mesh Specialty) ×2 IMPLANT
MESH VENTRLGHT ELLIPSE 8X6XMFL (Mesh Specialty) ×1 IMPLANT
NEEDLE SPNL 22GX3.5 QUINCKE BK (NEEDLE) ×3 IMPLANT
PAD POSITIONING PINK XL (MISCELLANEOUS) ×3 IMPLANT
SCISSORS LAP 5X35 DISP (ENDOMECHANICALS) ×3 IMPLANT
SET TUBE SMOKE EVAC HIGH FLOW (TUBING) ×3 IMPLANT
SLEEVE ADV FIXATION 5X100MM (TROCAR) ×3 IMPLANT
SPONGE GAUZE 2X2 STER 10/PKG (GAUZE/BANDAGES/DRESSINGS) ×4
STRIP CLOSURE SKIN 1/2X4 (GAUZE/BANDAGES/DRESSINGS) ×4 IMPLANT
SUT MNCRL AB 4-0 PS2 18 (SUTURE) ×3 IMPLANT
SUT PDS AB 1 CT1 27 (SUTURE) ×12 IMPLANT
SUT PROLENE 1 CT 1 30 (SUTURE) ×18 IMPLANT
TOWEL OR 17X26 10 PK STRL BLUE (TOWEL DISPOSABLE) ×3 IMPLANT
TRAY LAPAROSCOPIC (CUSTOM PROCEDURE TRAY) ×3 IMPLANT
TROCAR ADV FIXATION 11X100MM (TROCAR) IMPLANT
TROCAR ADV FIXATION 5X100MM (TROCAR) ×3 IMPLANT
TROCAR BLADELESS OPT 5 100 (ENDOMECHANICALS) ×3 IMPLANT

## 2019-01-30 NOTE — H&P (Signed)
Jillian Harmon DOB: October 24, 1950 Married / Language: English / Race: White Female  Patient Care Team: Kristie Cowman, MD as PCP - General (Family Medicine) Michael Boston, MD as Consulting Physician (General Surgery) Marylynn Pearson, MD as Referring Physician (Internal Medicine)   History of Present Illness Adin Hector MD; 11/11/2018 2:38 PM) The patient is a 68 year old female who presents with an incisional hernia. Note for "Incisional hernia": ` ` ` Patient sent for surgical consultation at the request of Dr Dillard Essex  Chief Complaint: Swelling and right upper quadrant. Question of hernia. ` ` The patient is a woman a history of heavy alcohol use with seizure disorder as well as mild dementia controlled with some intermittent pain and swelling. History of lap cholecystectomy many years ago in [place] area. An ultrasound in 2018 which showed no gallbladder remnant nor any other abnormalities. However had some persistent complaints. Evaluated by her medicine doctor at her facility where she lives. Sent for ultrasound. Concern for possible hernia containing small bowel in the epigastric region. Surgical consultation requested.  Patient comes in today with her son. She lives at [facility] assisted living. However, she thinks she still lives up in [place] it is just visiting. Son notes that she's been living there for almost a year. Patient denies much pain but she does think it is getting larger. It is uncomfortable if it gets bumped or touched. No nausea or vomiting. Denies any heartburn or reflux. No retching. No fall or trauma. She initially did not recall getting a cholecystectomy but when I mentioned to her and she'll the incision she can recall. Denies any other surgery. I don't know how reliable historian she is, but she claims she can walk about 30 minutes. She claims to have about 2 bowel movements a day. She does not smoke tobacco. Not been any  drinking this year, although she claims she occasionally does. Son notes that she is not allowed alcohol at the assisted living.  (Review of systems as stated in this history (HPI) or in the review of systems. Otherwise all other 12 point ROS are negative) ` ` `   Past Surgical History Emeline Gins, [place]; 11/11/2018 2:08 PM) Tonsillectomy  Diagnostic Studies History Emeline Gins, [place]; 11/11/2018 2:08 PM) Colonoscopy never Mammogram >3 years ago Pap Smear >5 years ago  Allergies Emeline Gins, [place]; 11/11/2018 2:10 PM) Zoloft *ANTIDEPRESSANTS* Allergies Reconciled  Medication History Emeline Gins, CMA; 11/11/2018 2:12 PM) Escitalopram Oxalate (20MG  Tablet, Oral) Active. Flonase Sensimist (27.5MCG/SPRAY Suspension, Nasal) Active. levETIRAcetam (500MG  Tablet, Oral) Active. Loratadine (10MG  Tablet, Oral) Active. LORazepam (0.5MG  Tablet, Oral) Active. Medications Reconciled  Pregnancy / Birth History Emeline Gins, [place]; 11/11/2018 2:08 PM) Age at menarche 75 years. Age of menopause >52 Gravida 2 Length (months) of breastfeeding 7-12 Maternal age 19-30 Para 2  Other Problems Emeline Gins, [place]; 11/11/2018 2:08 PM) Seizure Disorder Thyroid Cancer Thyroid Disease     Review of Systems Emeline Gins CMA; 11/11/2018 2:08 PM) General Not Present- Appetite Loss, Chills, Fatigue, Fever, Night Sweats, Weight Gain and Weight Loss. HEENT Present- Seasonal Allergies and Wears glasses/contact lenses. Not Present- Earache, Hearing Loss, Hoarseness, Nose Bleed, Oral Ulcers, Ringing in the Ears, Sinus Pain, Sore Throat, Visual Disturbances and Yellow Eyes. Respiratory Not Present- Bloody sputum, Chronic Cough, Difficulty Breathing, Snoring and Wheezing. Breast Not Present- Breast Mass, Breast Pain, Nipple Discharge and Skin Changes. Cardiovascular Not Present- Chest Pain, Difficulty Breathing Lying Down, Leg Cramps, Palpitations, Rapid Heart  Rate, Shortness of Breath and Swelling  of Extremities. Female Genitourinary Not Present- Frequency, Nocturia, Painful Urination, Pelvic Pain and Urgency. Neurological Present- Seizures. Not Present- Decreased Memory, Fainting, Headaches, Numbness, Tingling, Tremor, Trouble walking and Weakness. Psychiatric Not Present- Anxiety, Bipolar, Change in Sleep Pattern, Depression, Fearful and Frequent crying. Endocrine Not Present- Cold Intolerance, Excessive Hunger, Hair Changes, Heat Intolerance, Hot flashes and New Diabetes.  Vitals Emeline Gins CMA; 11/11/2018 2:09 PM) 11/11/2018 2:09 PM Weight: 141.6 lb Height: 61in Body Surface Area: 1.63 m Body Mass Index: 26.75 kg/m  Temp.: 45F  Pulse: 86 (Regular)  BP: 130/84 (Sitting, Left Arm, Standard)    BP 127/80    Pulse 80    Temp 98.4 F (36.9 C) (Oral)    Resp 16    Ht 5\' 1"  (1.549 m)    Wt 65.8 kg    SpO2 95%    BMI 27.40 kg/m  01/30/2019     Physical Exam Adin Hector MD; 11/11/2018 2:35 PM)  General Mental Status-Alert. General Appearance-Not in acute distress, Not Sickly. Orientation-Oriented X3. Hydration-Well hydrated. Voice-Normal.  Integumentary Global Assessment Upon inspection and palpation of skin surfaces of the - Axillae: non-tender, no inflammation or ulceration, no drainage. and Distribution of scalp and body hair is normal. General Characteristics Temperature - normal warmth is noted.  Head and Neck Head-normocephalic, atraumatic with no lesions or palpable masses. Face Global Assessment - atraumatic, no absence of expression. Neck Global Assessment - no abnormal movements, no bruit auscultated on the right, no bruit auscultated on the left, no decreased range of motion, non-tender. Trachea-midline. Thyroid Gland Characteristics - non-tender.  Eye Eyeball - Left-Extraocular movements intact, No Nystagmus - Left. Eyeball - Right-Extraocular movements intact, No  Nystagmus - Right. Cornea - Left-No Hazy - Left. Cornea - Right-No Hazy - Right. Sclera/Conjunctiva - Left-No scleral icterus, No Discharge - Left. Sclera/Conjunctiva - Right-No scleral icterus, No Discharge - Right. Pupil - Left-Direct reaction to light normal. Pupil - Right-Direct reaction to light normal.  ENMT Ears Pinna - Left - no drainage observed, no generalized tenderness observed. Pinna - Right - no drainage observed, no generalized tenderness observed. Nose and Sinuses External Inspection of the Nose - no destructive lesion observed. Inspection of the nares - Left - quiet respiration. Inspection of the nares - Right - quiet respiration. Mouth and Throat Lips - Upper Lip - no fissures observed, no pallor noted. Lower Lip - no fissures observed, no pallor noted. Nasopharynx - no discharge present. Oral Cavity/Oropharynx - Tongue - no dryness observed. Oral Mucosa - no cyanosis observed. Hypopharynx - no evidence of airway distress observed.  Chest and Lung Exam Inspection Movements - Normal and Symmetrical. Accessory muscles - No use of accessory muscles in breathing. Palpation Palpation of the chest reveals - Non-tender. Auscultation Breath sounds - Normal and Clear.  Cardiovascular Auscultation Rhythm - Regular. Murmurs & Other Heart Sounds - Auscultation of the heart reveals - No Murmurs and No Systolic Clicks.  Abdomen Inspection Inspection of the abdomen reveals - No Visible peristalsis and No Abnormal pulsations. Umbilicus - No Bleeding, No Urine drainage. Palpation/Percussion Palpation and Percussion of the abdomen reveal - Soft, Non Tender, No Rebound tenderness, No Rigidity (guarding) and No Cutaneous hyperesthesia. Note: Abdomen soft. Not severely distended. Mild supraumbilical diastases recti. Port site incisions classic for laparoscopic cholecystectomy.  5 x 5 cm subcutaneous mass in epigastric region right lateral to the epigastric port  site. Sensitive to touch. Consistent with incarcerated hernia. No umbilical or other anterior abdominal wall hernias  Female Genitourinary  Sexual Maturity Tanner 5 - Adult hair pattern. Note: No vaginal bleeding nor discharge  Peripheral Vascular Upper Extremity Inspection - Left - No Cyanotic nailbeds - Left, Not Ischemic. Inspection - Right - No Cyanotic nailbeds - Right, Not Ischemic.  Neurologic Neurologic evaluation reveals -normal attention span and ability to concentrate, able to name objects and repeat phrases. Appropriate fund of knowledge , normal sensation and normal coordination. Mental Status Affect - not angry, not paranoid. Cranial Nerves-Normal Bilaterally. Gait-Normal.  Neuropsychiatric Mental status exam performed with findings of-able to articulate well with normal speech/language, rate, volume and coherence, thought content normal with ability to perform basic computations and apply abstract reasoning and no evidence of hallucinations, delusions, obsessions or homicidal/suicidal ideation. The patient's mood and affect are described as -normal. Judgment and Insight-the patient displays appropriate judgment regarding every day activities. Thought Processes/Cognitive Function-long term memory impaired(Patient think she still is in [place] area. Has been living in assisted living in town for almost a year. Could not recall her prior cholecystectomy first but then remembered.).  Musculoskeletal Global Assessment Spine, Ribs and Pelvis - no instability, subluxation or laxity. Right Upper Extremity - no instability, subluxation or laxity.  Lymphatic Head & Neck  General Head & Neck Lymphatics: Bilateral - Description - No Localized lymphadenopathy. Axillary  General Axillary Region: Bilateral - Description - No Localized lymphadenopathy. Femoral & Inguinal  Generalized Femoral & Inguinal Lymphatics: Left - Description - No Localized  lymphadenopathy. Right - Description - No Localized lymphadenopathy.    Assessment & Plan   INCISIONAL HERNIA, INCARCERATED (K43.0) Impression: Pleasant woman with epigastric 5x5cm mass not reducible near port site incision. Ultrasound notes small bowel within it. Concerning for incarcerated incisional hernia.  Because her small bowel within it and is incarcerated, I do not think this can just be observed. Would plan laparoscopic reduction and underlay repair with mesh to minimize recurrence.  I think her cardiopulmonary risks are low. However she does have some dementia most reliably related to her prior heavy alcohol use. Otherwise seems pretty stable and functional even though she has some short-term memory issues. I had discussion with the patient and especially her husband. She her dementia could get worse with this. But hopefully because it's a quicker case it is not too likely. Usually this is an outpatient surgery but she may need to stay overnight or perhaps longer depending on her mental status. It is sensitive to touch and not reducible. Patient and son are leaning toward surgery.  Ready for surgery   The anatomy & physiology of the abdominal wall was discussed. The pathophysiology of hernias was discussed. Natural history risks without surgery including progeressive enlargement, pain, incarceration, & strangulation was discussed. Contributors to complications such as smoking, obesity, diabetes, prior surgery, etc were discussed.  I feel the risks of no intervention will lead to serious problems that outweigh the operative risks; therefore, I recommended surgery to reduce and repair the hernia. I explained laparoscopic techniques with possible need for an open approach. I noted the probable use of mesh to patch and/or buttress the hernia repair  Risks such as bleeding, infection, abscess, need for further treatment, heart attack, death, and other risks were discussed.  I noted a good likelihood this will help address the problem. Goals of post-operative recovery were discussed as well. Possibility that this will not correct all symptoms was explained. I stressed the importance of low-impact activity, aggressive pain control, avoiding constipation, & not pushing through pain to minimize risk of post-operative chronic  pain or injury. Possibility of reherniation especially with smoking, obesity, diabetes, immunosuppression, and other health conditions was discussed. We will work to minimize complications.  An educational handout further explaining the pathology & treatment options was given as well. Questions were answered. The patient & her son express understanding & wishes to proceed with surgery.   Adin Hector, MD, FACS, MASCRS Gastrointestinal and Minimally Invasive Surgery    [address], [place], [place] [address] [phone] Main / Paging [phone] Fax

## 2019-01-30 NOTE — Anesthesia Procedure Notes (Signed)
Procedure Name: Intubation Date/Time: 01/30/2019 3:41 PM Performed by: Lollie Sails, CRNA Pre-anesthesia Checklist: Patient identified, Emergency Drugs available, Suction available, Patient being monitored and Timeout performed Patient Re-evaluated:Patient Re-evaluated prior to induction Oxygen Delivery Method: Circle system utilized Preoxygenation: Pre-oxygenation with 100% oxygen Induction Type: IV induction Ventilation: Mask ventilation without difficulty Laryngoscope Size: Miller and 3 Grade View: Grade I Tube type: Oral Tube size: 7.5 mm Number of attempts: 1 Airway Equipment and Method: Stylet Placement Confirmation: ETT inserted through vocal cords under direct vision,  positive ETCO2 and breath sounds checked- equal and bilateral Secured at: 22 cm Tube secured with: Tape Dental Injury: Teeth and Oropharynx as per pre-operative assessment

## 2019-01-30 NOTE — Discharge Instructions (Signed)

## 2019-01-30 NOTE — Anesthesia Postprocedure Evaluation (Signed)
Anesthesia Post Note  Patient: Counselling psychologist  Procedure(s) Performed: LAPAROSCOPIC INCARCERATED INCISIONAL HERNIA  REPAIR WITH MESH (N/A )     Patient location during evaluation: PACU Anesthesia Type: General Level of consciousness: awake and alert Pain management: pain level controlled Vital Signs Assessment: post-procedure vital signs reviewed and stable Respiratory status: spontaneous breathing, nonlabored ventilation, respiratory function stable and patient connected to nasal cannula oxygen Cardiovascular status: blood pressure returned to baseline and stable Postop Assessment: no apparent nausea or vomiting Anesthetic complications: no    Last Vitals:  Vitals:   01/30/19 1800 01/30/19 1815  BP: (!) 152/87 116/82  Pulse: 93 95  Resp: 20 16  Temp:  36.9 C  SpO2: 92% 93%    Last Pain:  Vitals:   01/30/19 1815  TempSrc:   PainSc: 4                  Barnet Glasgow

## 2019-01-30 NOTE — Interval H&P Note (Signed)
History and Physical Interval Note:  01/30/2019 3:20 PM  Jillian Harmon  has presented today for surgery, with the diagnosis of [place].  The various methods of treatment have been discussed with the patient and family. After consideration of risks, benefits and other options for treatment, the patient has consented to  Procedure(s): [place] (N/A) as a surgical intervention.  The patient's history has been reviewed, patient examined, no change in status, stable for surgery.  I have reviewed the patient's chart and labs.  Questions were answered to the patient's satisfaction.    I have re-reviewed the the patient's records, history, medications, and allergies.  I have re-examined the patient.  I again discussed intraoperative plans and goals of post-operative recovery.  The patient agrees to proceed.  Jillian Harmon  [birth year] [id number]  Patient Care Team: Kristie Cowman, MD as PCP - General (Family Medicine) Michael Boston, MD as Consulting Physician (General Surgery) Marylynn Pearson, MD as Referring Physician (Internal Medicine) Kathrynn Ducking, MD as Consulting Physician (Neurology)  Patient Active Problem List   Diagnosis Date Noted  . Seizures ([place]) 11/19/2015  . Memory difficulty 11/19/2015  . Alcohol withdrawal seizure ([place]) 10/20/2015  . Syncope and collapse   . Faintness   . Gait instability   . Gastrointestinal hemorrhage associated with gastric ulcer   . Acute blood loss anemia 10/18/2015  . Syncope and collapse 10/18/2015  . Alcohol dependence ([place]) 10/18/2015  . Upper GI bleed 10/18/2015  . Bleeding gastrointestinal   . Symptomatic anemia     Past Medical History:  Diagnosis Date  . Alcohol related seizure ([place])   . 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 Anemia   . Anxiety   . Closed head injury 2015; early 2017   "fell in my house"  . Delirium,  withdrawal, alcoholic ([place]) 0000000  . Depression   . H/O ETOH abuse    "has not had anything to 2 months" (10/2015)  . History of blood transfusion 10/18/2015   "anemia"  . 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 History of gastric ulcer   . History of subdural hematoma 07/2017   right subdural hematoma extending between the leaves of tentorium on the right side.  . Hypertension    per [facility] at [facility]  . Hypoglycemia   . Hypothyroidism   . Memory difficulty 11/19/2015  . Memory loss   . Seasonal allergies   . Seizures ([place]) 11/19/2015  . Stroke [facility]) 2007   "dr's weren't sure if it was a stroke or seizure" (10/19/2015)  . Ventral hernia     Past Surgical History:  Procedure Laterality Date  . CHOLECYSTECTOMY OPEN    . ESOPHAGOGASTRODUODENOSCOPY N/A 10/19/2015   Procedure: ESOPHAGOGASTRODUODENOSCOPY (EGD);  Surgeon: Manus Gunning, MD;  Location: [facility];  Service: Gastroenterology;  Laterality: N/A;  . TONSILLECTOMY      Social History   Socioeconomic History  . Marital status: Widowed    Spouse name: Not on file  . Number of children: 2  . Years of education: Doctorate  . Highest education level: Not on file  Occupational History  . Occupation: N/A  Social Needs  . Financial resource strain: Not on file  . Food insecurity    Worry: Not on file    Inability: Not on file  . Transportation needs    Medical: Not on file  Non-medical: Not on file  Tobacco Use  . Smoking status: Never Smoker  . Smokeless tobacco: Never Used  Substance and Sexual Activity  . Alcohol use: No    Alcohol/week: 0.0 standard drinks    Comment: Previous alcohol use, now none  . Drug use: No  . Sexual activity: Not Currently  Lifestyle  . Physical activity    Days per week: Not on file    Minutes per session: Not on file  . Stress: Not on file  Relationships  . Social Herbalist on phone:  Not on file    Gets together: Not on file    Attends religious service: Not on file    Active member of club or organization: Not on file    Attends meetings of clubs or organizations: Not on file    Relationship status: Not on file  . Intimate partner violence    Fear of current or ex partner: Not on file    Emotionally abused: Not on file    Physically abused: Not on file    Forced sexual activity: Not on file  Other Topics Concern  . Not on file  Social History Narrative   Currently staying w/ her son    Right-handed   Drinks between 1 and 6 cups of black coffee or sodas per day    Family History  Problem Relation Age of Onset  . Other Mother        Hypoglycemia  . Cancer Father   . Cancer Paternal Grandfather   . Seizures Neg Hx     Medications Prior to Admission  Medication Sig Dispense Refill Last Dose  . escitalopram (LEXAPRO) 20 MG tablet Take 20 mg by mouth daily.   01/29/2019 at Unknown time  . fluticasone (FLONASE SENSIMIST) 27.5 MCG/SPRAY nasal spray Place 2 sprays into the nose daily.   01/29/2019 at Unknown time  . levETIRAcetam (KEPPRA) 500 MG tablet Take 500 mg by mouth every 12 (twelve) hours.   01/29/2019 at Unknown time  . loratadine (CLARITIN) 10 MG tablet Take 10 mg by mouth daily.   01/29/2019 at Unknown time  . Multiple Vitamins-Minerals (CENTRUM SILVER PO) Take 1 tablet by mouth daily.   01/29/2019 at Unknown time  . acetaminophen (TYLENOL) 500 MG tablet Take 1,000 mg by mouth every 8 (eight) hours as needed for moderate pain or headache.   Unknown at Unknown time  . guaifenesin (GNP TUSSIN) 100 MG/5ML syrup Take 200 mg by mouth every 4 (four) hours as needed for cough or congestion.   Unknown at Unknown time  . ibuprofen (ADVIL) 200 MG tablet Take 400 mg by mouth every 8 (eight) hours as needed for moderate pain. Not relieved by tylenol (may give as soon as 1 hour after tylenol)   Unknown at Unknown time  . LORazepam (ATIVAN) 0.5 MG tablet Take 0.5 mg by  mouth 3 (three) times daily. [id number] May take 0.5 mg every 6 hours as needed for anxiety (may give an hour before or after scheduled dose)   Unknown at Unknown time    Current Facility-Administered Medications  Medication Dose Route Frequency Provider Last Rate Last Dose  . bupivacaine liposome (EXPAREL) 1.3 % injection 266 mg  20 mL Infiltration On Call to OR Michael Boston, MD      . Derrill Memo ON 01/31/2019] ceFAZolin (ANCEF) IVPB 2g/100 mL premix  2 g Intravenous On Call to OR Michael Boston, MD      . lactated ringers  infusion   Intravenous Continuous Barnet Glasgow, MD 10 mL/hr at 01/30/19 1323       Allergies  Allergen Reactions  . Sertraline Other (See Comments)     Causes hallucinations/paranoid thoughts    BP 127/80   Pulse 80   Temp 98.4 F (36.9 C) (Oral)   Resp 16   Ht 5\' 1"  (1.549 m)   Wt 65.8 kg   SpO2 95%   BMI 27.40 kg/m   Labs: Results for orders placed or performed during the hospital encounter of 01/30/19 (from the past 48 hour(s))  CBC with Differential/Platelet     Status: None   Collection Time: 01/30/19 12:39 PM  Result Value Ref Range   WBC 6.0 4.0 - 10.5 K/uL   RBC 4.52 3.87 - 5.11 MIL/uL   Hemoglobin 13.8 12.0 - 15.0 g/dL   HCT 41.7 36.0 - 46.0 %   MCV 92.3 80.0 - 100.0 fL   MCH 30.5 26.0 - 34.0 pg   MCHC 33.1 30.0 - 36.0 g/dL   RDW 12.0 11.5 - 15.5 %   Platelets 251 150 - 400 K/uL   nRBC 0.0 0.0 - 0.2 %   Neutrophils Relative % 60 %   Neutro Abs 3.6 1.7 - 7.7 K/uL   Lymphocytes Relative 30 %   Lymphs Abs 1.8 0.7 - 4.0 K/uL   Monocytes Relative 7 %   Monocytes Absolute 0.4 0.1 - 1.0 K/uL   Eosinophils Relative 2 %   Eosinophils Absolute 0.1 0.0 - 0.5 K/uL   Basophils Relative 1 %   Basophils Absolute 0.0 0.0 - 0.1 K/uL   Immature Granulocytes 0 %   Abs Immature Granulocytes 0.02 0.00 - 0.07 K/uL    Comment: Performed at [facility], [place] [address] [place], [place] [id number]  Basic metabolic panel     Status:  Abnormal   Collection Time: 01/30/19  1:20 PM  Result Value Ref Range   Sodium 139 135 - 145 mmol/L   Potassium 4.3 3.5 - 5.1 mmol/L   Chloride 109 98 - 111 mmol/L   CO2 19 (L) 22 - 32 mmol/L   Glucose, Bld 94 70 - 99 mg/dL   BUN 9 8 - 23 mg/dL   Creatinine, Ser 0.73 0.44 - 1.00 mg/dL   Calcium 9.6 8.9 - 10.3 mg/dL   GFR calc non Af Amer >60 >60 mL/min   GFR calc Af Amer >60 >60 mL/min   Anion gap 11 5 - 15    Comment: Performed at [facility], [place] [address], [place], [place] [address]    Imaging / Studies: No results found.   Adin Hector, M.D., F.A.C.S. Gastrointestinal and Minimally Invasive Surgery [facility] [address], [place], [place] [address] [phone] Main / Paging  01/30/2019 3:20 PM    Adin Hector

## 2019-01-30 NOTE — Op Note (Signed)
01/30/2019  PATIENT:  Jillian Harmon  68 y.o. female  Patient Care Team: Kristie Cowman, MD as PCP - General (Family Medicine) Michael Boston, MD as Consulting Physician (General Surgery) Marylynn Pearson, MD as Referring Physician (Internal Medicine) Kathrynn Ducking, MD as Consulting Physician (Neurology)  PRE-OPERATIVE DIAGNOSIS:  INCARCERATED  INCISIONAL ABDOMINAL WALL HERNIA  POST-OPERATIVE DIAGNOSIS:  INCARCERATED  INCISIONAL ABDOMINAL WALL HERNIA  PROCEDURE:  LAPAROSCOPIC INCARCERATED INCISIONAL HERNIA  REPAIR WITH MESH  SURGEON:  Adin Hector, MD  ASSISTANT: Nurse   ANESTHESIA:     General  Nerve block provided with liposomal bupivacaine (Experel) mixed with 0.25% bupivacaine as a Bilateral TAP block x 16mL each side at the level of the transverse abdominis & preperitoneal spaces along the flank at the anterior axillary line, from subcostal ridge to iliac crest under laparoscopic guidance   EBL:  Total I/O In: -  Out: 10 [Blood:10]  Per anesthesia record  Delay start of Pharmacological VTE agent (>24hrs) due to surgical blood loss or risk of bleeding:  no  DRAINS: none   SPECIMEN:  No Specimen  DISPOSITION OF SPECIMEN:  N/A  COUNTS:  YES  PLAN OF CARE: Admit for overnight observation  PATIENT DISPOSITION:  PACU - hemodynamically stable.  INDICATION: Pleasant patient has developed a ventral wall abdominal hernia.   Recommendation was made for surgical repair:  The anatomy & physiology of the abdominal wall was discussed. The pathophysiology of hernias was discussed. Natural history risks without surgery including progeressive enlargement, pain, incarceration & strangulation was discussed. Contributors to complications such as smoking, obesity, diabetes, prior surgery, etc were discussed.  I feel the risks of no intervention will lead to serious problems that outweigh the operative risks; therefore, I recommended surgery to reduce and repair the hernia.  I explained laparoscopic techniques with possible need for an open approach. I noted the probable use of mesh to patch and/or buttress the hernia repair.   Risks such as bleeding, infection, abscess, need for further treatment, heart attack, death, and other risks were discussed. I noted a good likelihood this will help address the problem. Goals of post-operative recovery were discussed as well. Possibility that this will not correct all symptoms was explained. I stressed the importance of low-impact activity, aggressive pain control, avoiding constipation, & not pushing through pain to minimize risk of post-operative chronic pain or injury. Possibility of reherniation especially with smoking, obesity, diabetes, immunosuppression, and other health conditions was discussed. We will work to minimize complications.  An educational handout further explaining the pathology & treatment options was given as well. Questions were answered. The patient expresses understanding & wishes to proceed with surgery.   OR FINDINGS: 4 x 3 cm epigastric hernia incarcerated with falciform ligament and greater omentum.  Type of repair: Laparoscopic underlay repair   Placement of mesh: Intraperitoneal underlay repair  Name of mesh: Bard Ventralight dual sided (polypropylene / Seprafilm)  Size of mesh: 20x15cm  Orientation: Transverse  Mesh overlap:  5-7cm   DESCRIPTION:   Informed consent was confirmed. The patient underwent general anaesthesia without difficulty. The patient was positioned appropriately. VTE prevention in place. The patient's abdomen was clipped, prepped, & draped in a sterile fashion. Surgical timeout confirmed our plan.  The patient was positioned in reverse Trendelenburg. Abdominal entry was gained using optical entry technique in the left upper abdomen. Entry was clean. I induced carbon dioxide insufflation. Camera inspection revealed no injury. Extra ports were carefully placed under direct  laparoscopic visualization.  I could see adhesions on the parietal peritoneum under the abdominal wall.    I did laparoscopic lysis of adhesions to expose the entire anterior abdominal wall.  I primarily used focused sharp dissection.  I freed off the falciform ligament and central peritoneum to expose the retrorectus fascia in the supraumbilical region.  That helped reduce the incarcerated fat and omentum out of an obvious epigastric incisional hernia at the site of her prior cholecystectomy.   I made sure hemostasis was good.  I mapped out the region using a needle passer.   To ensure that I would have at least 5 cm radial coverage outside of the hernia defect, I chose a 20x15cm dual sided mesh.  I placed #1 Prolene stitches around the sides and inferior edge about every 5 cm = 8 total.  I rolled the mesh & placed into the peritoneal cavity through the  hernia defect.  I unrolled the mesh and positioned it appropriately.    I secured the mesh to cover up the hernia defect using a laparoscopic suture passer to pass the tails of the Prolene through the abdominal wall & tagged them with clamps for good transfascial suturing.  I started out in four corners to make sure I had the mesh centered under the hernia defect appropriately, and then proceeded to work in quadrants.  I then passed #1 Prolene sutures along the inner right and left subxiphoid subcostal ridge to help tack the mesh.  This allowed the edge of the mesh to be actually above the rib cage for good overlap since this was an epigastric hernia of moderate size.  We evacuated CO2 & desufflated the abdomen.  I tied the fascial stitches down. I closed the fascial defect that I placed the mesh through using #1 PDS interrupted transverse stitches primarily.  I reinsufflated the abdomen. The mesh provided at least circumferential coverage around the entire region of hernia defects.  I secured the mesh centrally with an additional trans fascial stitch in &  out the mesh using #1 PDS under laparoscopic visualization.   I tacked the edges & central part of the mesh to the peritoneum/posterior rectus fascia with SecureStrap absorbable tacks.   I did reinspection. Hemostasis was good. Mesh laid well. I completed a broad field block of local anesthesia at fascial stitch sites & fascial closure areas.    Capnoperitoneum was evacuated. Ports were removed. The skin was closed with Monocryl at the port sites and Steri-Strips on the fascial stitch puncture sites.  Patient is being extubated to go to the recovery room.  I discussed operative findings, updated the patient's status, discussed probable steps to recovery, and gave postoperative recommendations to the The patient's son, Zoeigh Reaume.  Recommendations were made.  Questions were answered.  He expressed understanding & appreciation.  Adin Hector, M.D., F.A.C.S. Gastrointestinal and Minimally Invasive Surgery [facility] [address], [place], [place] [address] [phone] Main / Paging  01/30/2019 5:31 PM

## 2019-01-30 NOTE — Transfer of Care (Signed)
Immediate Anesthesia Transfer of Care Note  Patient: Counselling psychologist  Procedure(s) Performed: LAPAROSCOPIC INCARCERATED INCISIONAL HERNIA  REPAIR WITH MESH (N/A )  Patient Location: PACU  Anesthesia Type:General  Level of Consciousness: awake and patient cooperative  Airway & Oxygen Therapy: Patient Spontanous Breathing and Patient connected to face mask oxygen  Post-op Assessment: Report given to RN and Post -op Vital signs reviewed and stable  Post vital signs: Reviewed and stable  Last Vitals:  Vitals Value Taken Time  BP 145/100 01/30/19 1740  Temp    Pulse 99 01/30/19 1743  Resp 22 01/30/19 1743  SpO2 100 % 01/30/19 1743  Vitals shown include unvalidated device data.  Last Pain:  Vitals:   01/30/19 1740  TempSrc:   PainSc: (P) 0-No pain         Complications: No apparent anesthesia complications

## 2019-01-31 ENCOUNTER — Encounter (HOSPITAL_COMMUNITY): Payer: Self-pay | Admitting: Surgery

## 2019-01-31 DIAGNOSIS — K430 Incisional hernia with obstruction, without gangrene: Secondary | ICD-10-CM | POA: Diagnosis not present

## 2019-01-31 NOTE — Discharge Summary (Signed)
Physician Discharge Summary    Patient ID: Jillian Harmon MRN: [id number] DOB/AGE: 1950-05-24  68 y.o.  Patient Care Team: Kristie Cowman, MD as PCP - General (Family Medicine) Michael Boston, MD as Consulting Physician (General Surgery) Marylynn Pearson, MD as Referring Physician (Internal Medicine) Kathrynn Ducking, MD as Consulting Physician (Neurology)  Admit date: 01/30/2019  Discharge date: 01/31/2019  Hospital Stay = 0 days    Discharge Diagnoses:  Principal Problem:   Incarcerated incisional hernia s/p lap repair w mesh 01/30/2019 Active Problems:   Alcohol dependence ([place])   Gait instability   Seizures ([place])   Memory difficulty   1 Day Post-Op  01/30/2019  POST-OPERATIVE DIAGNOSIS:  INCARCERATED  INCISIONAL ABDOMINAL WALL HERNIA  PROCEDURE:  LAPAROSCOPIC INCARCERATED INCISIONAL HERNIA  REPAIR WITH MESH  SURGEON:  Adin Hector, MD  Consults: None  Hospital Course:   The patient underwent the surgery above.  Postoperatively, the patient gradually mobilized and advanced to a solid diet.  Pain and other symptoms were treated aggressively.    By the time of discharge, the patient was walking well the hallways, eating food, having flatus.  Pain was well-controlled on an oral medications.  Based on meeting discharge criteria and continuing to recover, I felt it was safe for the patient to be discharged from the hospital to further recover with close followup. Postoperative recommendations were discussed in detail.  They are written as well.  Discharged Condition: good  Discharge Exam: Blood pressure 139/88, pulse 72, temperature 97.7 F (36.5 C), temperature source Oral, resp. rate (!) 21, height 5\' 1"  (1.549 m), weight 65.8 kg, SpO2 98 %.  General: Pt awake/alert/oriented x4 in No acute distress Eyes: PERRL, normal EOM.  Sclera clear.  No icterus Neuro: CN II-XII intact w/o focal sensory/motor deficits. Lymph: No head/neck/groin lymphadenopathy  Psych:  No delerium/psychosis/paranoia HENT: Normocephalic, Mucus membranes moist.  No thrush Neck: Supple, No tracheal deviation Chest: Mild anterior chest wall pain at hernia mesh.  O/w No chest wall pain w good excursion CV:  Pulses intact.  Regular rhythm MS: Normal AROM mjr joints.  No obvious deformity Abdomen: Soft.  Nondistended.  Mildly tender at incisions only.  No evidence of peritonitis.  No incarcerated hernias. Ext:  SCDs BLE.  No mjr edema.  No cyanosis Skin: No petechiae / purpura   Disposition:   Follow-up Information    Michael Boston, MD. Schedule an appointment as soon as possible for a visit in 3 weeks.   Specialty: General Surgery Why: To follow up after your operation, To follow up after your hospital stay Contact information: [address] [place] [address] [phone]           Discharge disposition: [place] Not Defined       Discharge Instructions    Call MD for:   Complete by: As directed    FEVER > 101.5 F  (temperatures < 101.5 F are not significant)   Call MD for:  extreme fatigue   Complete by: As directed    Call MD for:  persistant dizziness or light-headedness   Complete by: As directed    Call MD for:  persistant nausea and vomiting   Complete by: As directed    Call MD for:  redness, tenderness, or signs of infection (pain, swelling, redness, odor or green/yellow discharge around incision site)   Complete by: As directed    Call MD for:  severe uncontrolled pain   Complete by: As  directed    Diet - low sodium heart healthy   Complete by: As directed    Start with a bland diet such as soups, liquids, starchy foods, low fat foods, etc. the first few days at home. Gradually advance to a solid, low-fat, high fiber diet by the end of the first week at home.   Add a fiber supplement to your diet (Metamucil, etc) If you feel full, bloated, or constipated, stay on a full liquid or  pureed/blenderized diet for a few days until you feel better and are no longer constipated.   Discharge instructions   Complete by: As directed    See Discharge Instructions If you are not getting better after two weeks or are noticing you are getting worse, contact our office [phone] for further advice.  We may need to adjust your medications, re-evaluate you in the office, send you to the emergency room, or see what other things we can do to help. The clinic staff is available to answer your questions during regular business hours (8:30am-5pm).  Please don't hesitate to call and ask to speak to one of our nurses for clinical concerns.    A surgeon from [facility] is always on call at the hospitals 24 hours/day If you have a medical emergency, go to the nearest emergency room or call 911.   Discharge wound care:   Complete by: As directed    It is good for closed incisions and even open wounds to be washed every day.  Shower every day.  Short baths are fine.  Wash the incisions and wounds clean with soap & water.    You may leave closed incisions open to air if it is dry.   You may cover the incision with clean gauze & replace it after your daily shower for comfort.  STERISTRIPS:  You have skin tapes called Steristrips on your incisions.  Leave them in place, and they will fall off on their own like a scab.  You may trim any edges that curl up with clean scissors.  You may remove them in the shower in a week.   Driving Restrictions   Complete by: As directed    You may drive when: - you are no longer taking narcotic prescription pain medication - you can comfortably wear a seatbelt - you can safely make sudden turns/stops without pain.   Increase activity slowly   Complete by: As directed    Start light daily activities --- self-care, walking, climbing stairs- beginning the day after surgery.  Gradually increase activities as tolerated.  Control your pain to be active.   Stop when you are tired.  Ideally, walk several times a day, eventually an hour a day.   Most people are back to most day-to-day activities in a few weeks.  It takes 4-6 weeks to get back to unrestricted, intense activity. If you can walk 30 minutes without difficulty, it is safe to try more intense activity such as jogging, treadmill, bicycling, low-impact aerobics, swimming, etc. Save the most intensive and strenuous activity for last (Usually 4-8 weeks after surgery) such as sit-ups, heavy lifting, contact sports, etc.  Refrain from any intense heavy lifting or straining until you are off narcotics for pain control.  You will have off days, but things should improve week-by-week. DO NOT PUSH THROUGH PAIN.  Let pain be your guide: If it hurts to do something, don't do it.   Lifting restrictions   Complete by: As directed  If you can walk 30 minutes without difficulty, it is safe to try more intense activity such as jogging, treadmill, bicycling, low-impact aerobics, swimming, etc. Save the most intensive and strenuous activity for last (Usually 4-8 weeks after surgery) such as sit-ups, heavy lifting, contact sports, etc.   Refrain from any intense heavy lifting or straining until you are off narcotics for pain control.  You will have off days, but things should improve week-by-week. DO NOT PUSH THROUGH PAIN.  Let pain be your guide: If it hurts to do something, don't do it.  Pain is your body warning you to avoid that activity for another week until the pain goes down.   May shower / Bathe   Complete by: As directed    May walk up steps   Complete by: As directed    Remove dressing in 72 hours   Complete by: As directed    Make sure all dressings are removed by the third day after surgery.  Leave incisions open to air.  OK to cover incisions with gauze or bandages as desired   Sexual Activity Restrictions   Complete by: As directed    You may have sexual intercourse when it is comfortable. If  it hurts to do something, stop.      Allergies as of 01/31/2019      Reactions   Sertraline Other (See Comments)   Causes hallucinations/paranoid thoughts      Medication List    TAKE these medications   acetaminophen 500 MG tablet Commonly known as: TYLENOL Take 1,000 mg by mouth every 8 (eight) hours as needed for moderate pain or headache.   CENTRUM SILVER PO Take 1 tablet by mouth daily.   escitalopram 20 MG tablet Commonly known as: LEXAPRO Take 20 mg by mouth daily.   Flonase Sensimist 27.5 MCG/SPRAY nasal spray Generic drug: fluticasone Place 2 sprays into the nose daily.   GNP Tussin 100 MG/5ML syrup Generic drug: guaifenesin Take 200 mg by mouth every 4 (four) hours as needed for cough or congestion.   ibuprofen 200 MG tablet Commonly known as: ADVIL Take 400 mg by mouth every 8 (eight) hours as needed for moderate pain. Not relieved by tylenol (may give as soon as 1 hour after tylenol)   levETIRAcetam 500 MG tablet Commonly known as: KEPPRA Take 500 mg by mouth every 12 (twelve) hours.   loratadine 10 MG tablet Commonly known as: CLARITIN Take 10 mg by mouth daily.   LORazepam 0.5 MG tablet Commonly known as: ATIVAN Take 0.5 mg by mouth 3 (three) times daily. [id number] May take 0.5 mg every 6 hours as needed for anxiety (may give an hour before or after scheduled dose)   traMADol 50 MG tablet Commonly known as: ULTRAM Take 1-2 tablets (50-100 mg total) by mouth every 6 (six) hours as needed for moderate pain or severe pain.            Discharge Care Instructions  (From admission, onward)         Start     Ordered   01/30/19 0000  Discharge wound care:    Comments: It is good for closed incisions and even open wounds to be washed every day.  Shower every day.  Short baths are fine.  Wash the incisions and wounds clean with soap & water.    You may leave closed incisions open to air if it is dry.   You may cover the incision with clean  gauze &  replace it after your daily shower for comfort.  STERISTRIPS:  You have skin tapes called Steristrips on your incisions.  Leave them in place, and they will fall off on their own like a scab.  You may trim any edges that curl up with clean scissors.  You may remove them in the shower in a week.   01/30/19 1551          Significant Diagnostic Studies:  Results for orders placed or performed during the hospital encounter of 01/30/19 (from the past 72 hour(s))  CBC with Differential/Platelet     Status: None   Collection Time: 01/30/19 12:39 PM  Result Value Ref Range   WBC 6.0 4.0 - 10.5 K/uL   RBC 4.52 3.87 - 5.11 MIL/uL   Hemoglobin 13.8 12.0 - 15.0 g/dL   HCT 41.7 36.0 - 46.0 %   MCV 92.3 80.0 - 100.0 fL   MCH 30.5 26.0 - 34.0 pg   MCHC 33.1 30.0 - 36.0 g/dL   RDW 12.0 11.5 - 15.5 %   Platelets 251 150 - 400 K/uL   nRBC 0.0 0.0 - 0.2 %   Neutrophils Relative % 60 %   Neutro Abs 3.6 1.7 - 7.7 K/uL   Lymphocytes Relative 30 %   Lymphs Abs 1.8 0.7 - 4.0 K/uL   Monocytes Relative 7 %   Monocytes Absolute 0.4 0.1 - 1.0 K/uL   Eosinophils Relative 2 %   Eosinophils Absolute 0.1 0.0 - 0.5 K/uL   Basophils Relative 1 %   Basophils Absolute 0.0 0.0 - 0.1 K/uL   Immature Granulocytes 0 %   Abs Immature Granulocytes 0.02 0.00 - 0.07 K/uL    Comment: Performed at [facility], [place] [address], [place], [place] [id number]  Basic metabolic panel     Status: Abnormal   Collection Time: 01/30/19  1:20 PM  Result Value Ref Range   Sodium 139 135 - 145 mmol/L   Potassium 4.3 3.5 - 5.1 mmol/L   Chloride 109 98 - 111 mmol/L   CO2 19 (L) 22 - 32 mmol/L   Glucose, Bld 94 70 - 99 mg/dL   BUN 9 8 - 23 mg/dL   Creatinine, Ser 0.73 0.44 - 1.00 mg/dL   Calcium 9.6 8.9 - 10.3 mg/dL   GFR calc non Af Amer >60 >60 mL/min   GFR calc Af Amer >60 >60 mL/min   Anion gap 11 5 - 15    Comment: Performed at [facility], [place] [address] [place], [place]  [address]    No results found.  Past Medical History:  Diagnosis Date  . Alcohol related seizure ([place])   . Alcohol withdrawal seizure ([place]) 10/20/2015  . 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 Anemia   . Anxiety   . Closed head injury 2015; early 2017   "fell in my house"  . Delirium, withdrawal, alcoholic ([place]) 0000000  . Depression   . Gastrointestinal hemorrhage associated with gastric ulcer   . H/O ETOH abuse    "has not had anything to 2 months" (10/2015)  . History of blood transfusion 10/18/2015   "anemia"  . 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 History of gastric ulcer   . History of subdural hematoma 07/2017   right subdural hematoma extending between the leaves of tentorium on the right side.  . Hypertension    per [facility] at [facility]  . Hypoglycemia   .  Hypothyroidism   . Memory difficulty 11/19/2015  . Memory loss   . Seasonal allergies   . Seizures ([place]) 11/19/2015  . Stroke [facility]) 2007   "dr's weren't sure if it was a stroke or seizure" (10/19/2015)  . Syncope and collapse   . Ventral hernia     Past Surgical History:  Procedure Laterality Date  . CHOLECYSTECTOMY OPEN    . ESOPHAGOGASTRODUODENOSCOPY N/A 10/19/2015   Procedure: ESOPHAGOGASTRODUODENOSCOPY (EGD);  Surgeon: Manus Gunning, MD;  Location: [facility];  Service: Gastroenterology;  Laterality: N/A;  . TONSILLECTOMY      Social History   Socioeconomic History  . Marital status: Widowed    Spouse name: Not on file  . Number of children: 2  . Years of education: Doctorate  . Highest education level: Not on file  Occupational History  . Occupation: N/A  Social Needs  . Financial resource strain: Not on file  . Food insecurity    Worry: Not on file    Inability: Not on file  . Transportation needs    Medical: Not on file    Non-medical: Not on file  Tobacco Use  . Smoking status: Never Smoker  . Smokeless  tobacco: Never Used  Substance and Sexual Activity  . Alcohol use: No    Alcohol/week: 0.0 standard drinks    Comment: Previous alcohol use, now none  . Drug use: No  . Sexual activity: Not Currently  Lifestyle  . Physical activity    Days per week: Not on file    Minutes per session: Not on file  . Stress: Not on file  Relationships  . Social Herbalist on phone: Not on file    Gets together: Not on file    Attends religious service: Not on file    Active member of club or organization: Not on file    Attends meetings of clubs or organizations: Not on file    Relationship status: Not on file  . Intimate partner violence    Fear of current or ex partner: Not on file    Emotionally abused: Not on file    Physically abused: Not on file    Forced sexual activity: Not on file  Other Topics Concern  . Not on file  Social History Narrative   Currently staying w/ her son    Right-handed   Drinks between 1 and 6 cups of black coffee or sodas per day    Family History  Problem Relation Age of Onset  . Other Mother        Hypoglycemia  . Cancer Father   . Cancer Paternal Grandfather   . Seizures Neg Hx     Current Facility-Administered Medications  Medication Dose Route Frequency Provider Last Rate Last Dose  . 0.9 %  sodium chloride infusion  250 mL Intravenous PRN Michael Boston, MD      . acetaminophen (TYLENOL) tablet 1,000 mg  1,000 mg Oral Q8H PRN Michael Boston, MD      . acetaminophen (TYLENOL) tablet 1,000 mg  1,000 mg Oral Tor Netters, MD   1,000 mg at 01/31/19 0513  . bisacodyl (DULCOLAX) suppository 10 mg  10 mg Rectal Daily PRN Michael Boston, MD      . diphenhydrAMINE (BENADRYL) 12.5 MG/5ML elixir 12.5 mg  12.5 mg Oral Q6H PRN Michael Boston, MD       Or  . diphenhydrAMINE (BENADRYL) injection 12.5 mg  12.5 mg Intravenous Q6H PRN Michael Boston,  MD      . enalaprilat (VASOTEC) injection 0.625-1.25 mg  0.625-1.25 mg Intravenous Q6H PRN Michael Boston,  MD      . enoxaparin (LOVENOX) injection 40 mg  40 mg Subcutaneous Q24H Julio Storr, Remo Lipps, MD      . escitalopram (LEXAPRO) tablet 20 mg  20 mg Oral Daily Michael Boston, MD   20 mg at 01/30/19 2300  . fluticasone (FLONASE) 50 MCG/ACT nasal spray 2 spray  2 spray Each Nare Daily Michael Boston, MD      . gabapentin (NEURONTIN) capsule 300 mg  300 mg Oral BID Michael Boston, MD   300 mg at 01/30/19 2130  . guaiFENesin (ROBITUSSIN) 100 MG/5ML solution 200 mg  200 mg Oral Q4H PRN Michael Boston, MD      . HYDROmorphone (DILAUDID) injection 0.5-2 mg  0.5-2 mg Intravenous Q2H PRN Michael Boston, MD   1 mg at 01/30/19 2130  . ibuprofen (ADVIL) tablet 400 mg  400 mg Oral Q8H PRN Michael Boston, MD      . influenza vaccine adjuvanted (FLUAD) injection 0.5 mL  0.5 mL Intramuscular Tomorrow-1000 Michael Boston, MD      . lactated ringers bolus 1,000 mL  1,000 mL Intravenous Q8H PRN Marisella Puccio, Remo Lipps, MD      . lactated ringers infusion 1,000 mL  1,000 mL Intravenous Q8H PRN Michael Boston, MD      . levETIRAcetam (KEPPRA) tablet 500 mg  500 mg Oral Gorden Harms, MD   500 mg at 01/30/19 2131  . lip balm (CARMEX) ointment 1 application  1 application Topical BID Michael Boston, MD   1 application at 0000000 2224  . loratadine (CLARITIN) tablet 10 mg  10 mg Oral Daily Michael Boston, MD      . LORazepam (ATIVAN) tablet 0.5 mg  0.5 mg Oral 3 times per day Michael Boston, MD   0.5 mg at 01/31/19 0513  . LORazepam (ATIVAN) tablet 0.5 mg  0.5 mg Oral Q6H PRN Michael Boston, MD      . magic mouthwash  15 mL Oral QID PRN Michael Boston, MD      . methocarbamol (ROBAXIN) 1,000 mg in dextrose 5 % 50 mL IVPB  1,000 mg Intravenous Q6H PRN Michael Boston, MD      . methocarbamol (ROBAXIN) tablet 1,000 mg  1,000 mg Oral Q6H PRN Michael Boston, MD   1,000 mg at 01/31/19 0516  . metoprolol tartrate (LOPRESSOR) injection 5 mg  5 mg Intravenous Q6H PRN Michael Boston, MD      . ondansetron (ZOFRAN-ODT) disintegrating tablet 4 mg  4 mg Oral  Q6H PRN Michael Boston, MD       Or  . ondansetron [facility]) injection 4 mg  4 mg Intravenous Q6H PRN Michael Boston, MD      . oxyCODONE (Oxy IR/ROXICODONE) immediate release tablet 5-10 mg  5-10 mg Oral Q4H PRN Michael Boston, MD   5 mg at 01/31/19 0514  . pneumococcal 23 valent vaccine (PNU-IMMUNE) injection 0.5 mL  0.5 mL Intramuscular Tomorrow-1000 Michael Boston, MD      . polyethylene glycol (MIRALAX / GLYCOLAX) packet 17 g  17 g Oral Daily PRN Michael Boston, MD      . prochlorperazine (COMPAZINE) tablet 10 mg  10 mg Oral Q6H PRN Michael Boston, MD       Or  . prochlorperazine (COMPAZINE) injection 5-10 mg  5-10 mg Intravenous Q6H PRN Michael Boston, MD      . psyllium (HYDROCIL/METAMUCIL) packet 1 packet  1 packet Oral BID Michael Boston, MD   1 packet at 01/30/19 2129  . simethicone (MYLICON) chewable tablet 40 mg  40 mg Oral Q6H PRN Michael Boston, MD      . sodium chloride flush (NS) 0.9 % injection 3 mL  3 mL Intravenous Gorden Harms, MD      . sodium chloride flush (NS) 0.9 % injection 3 mL  3 mL Intravenous PRN Michael Boston, MD      . zolpidem (AMBIEN) tablet 5 mg  5 mg Oral QHS PRN Michael Boston, MD         Allergies  Allergen Reactions  . Sertraline Other (See Comments)     Causes hallucinations/paranoid thoughts    Signed: Morton Peters, MD, FACS, MASCRS Gastrointestinal and Minimally Invasive Surgery    [address], [place], [place] [address] [phone] Main / Paging [phone] Fax   01/31/2019, 7:28 AM

## 2019-01-31 NOTE — Plan of Care (Signed)
Pt was discharged home today. Instructions were reviewed with patient,and her son all questions were answered. Pt was taken to main entrance via wheelchair by NT.

## 2019-02-01 ENCOUNTER — Other Ambulatory Visit (HOSPITAL_COMMUNITY): Payer: Medicare Other

## 2019-05-23 ENCOUNTER — Ambulatory Visit: Payer: No Typology Code available for payment source | Admitting: Family

## 2019-06-03 ENCOUNTER — Ambulatory Visit: Payer: No Typology Code available for payment source | Admitting: Family

## 2020-05-03 ENCOUNTER — Telehealth: Payer: Self-pay | Admitting: Registered Nurse

## 2020-05-03 NOTE — Telephone Encounter (Signed)
Called pt lvm reminding of appt for tomorrow and to call to update insurance information

## 2020-05-04 ENCOUNTER — Ambulatory Visit: Payer: Medicare Other | Admitting: Registered Nurse

## 2020-05-05 ENCOUNTER — Encounter: Payer: Self-pay | Admitting: Registered Nurse

## 2020-10-21 ENCOUNTER — Other Ambulatory Visit: Payer: Self-pay

## 2020-10-21 ENCOUNTER — Emergency Department (HOSPITAL_BASED_OUTPATIENT_CLINIC_OR_DEPARTMENT_OTHER)
Admission: EM | Admit: 2020-10-21 | Discharge: 2020-10-21 | Disposition: A | Discharge status: Home / Self Care | Payer: Medicare Other | Attending: Emergency Medicine | Admitting: Emergency Medicine

## 2020-10-21 ENCOUNTER — Encounter (HOSPITAL_BASED_OUTPATIENT_CLINIC_OR_DEPARTMENT_OTHER): Payer: Self-pay | Admitting: Obstetrics and Gynecology

## 2020-10-21 DIAGNOSIS — Z79899 Other long term (current) drug therapy: Secondary | ICD-10-CM | POA: Diagnosis not present

## 2020-10-21 DIAGNOSIS — E039 Hypothyroidism, unspecified: Secondary | ICD-10-CM | POA: Insufficient documentation

## 2020-10-21 DIAGNOSIS — I10 Essential (primary) hypertension: Secondary | ICD-10-CM | POA: Diagnosis not present

## 2020-10-21 DIAGNOSIS — X58XXXA Exposure to other specified factors, initial encounter: Secondary | ICD-10-CM | POA: Insufficient documentation

## 2020-10-21 DIAGNOSIS — T18108A Unspecified foreign body in esophagus causing other injury, initial encounter: Secondary | ICD-10-CM | POA: Insufficient documentation

## 2020-10-21 NOTE — ED Notes (Signed)
Able to eat graham crackers and drink coke.  Doctor made aware.

## 2020-10-21 NOTE — ED Notes (Signed)
Patient redirected multiple times to stay in the room. Patient has become more agitated as the afternoon has progressed. [facility] has 5 people ahead of patient for transport

## 2020-10-21 NOTE — ED Triage Notes (Signed)
Patient reports to the ER via EMS from [facility] for a food bolus. Patient reportedly had swallowed food and then it got stuck and she has emesis. Unknown if patient able to swallow water, but patient is able to speak and is maintaining airway per EMS

## 2020-10-21 NOTE — ED Notes (Signed)
Coke and graham crackers provided per Dr. Rogene Houston.

## 2020-10-21 NOTE — ED Notes (Signed)
Patient provided a meal and coke at this time. Patient remains stable and awaiting [facility] for transport.

## 2020-10-21 NOTE — ED Notes (Signed)
Pt states she choked on pork chops but she feels better and feels like the food is gone and that she could drink water without any problems

## 2020-10-21 NOTE — ED Notes (Signed)
Pt was able to drink a half cup of water with no issues. Pt is now drinking a Cola

## 2020-10-21 NOTE — Discharge Instructions (Addendum)
Return for any new or worse symptoms with food getting stuck.  I would recommend following up with gastroenterology.  Referral information provided above call and make an appointment for them to take a look to see if there is anything significant that is causing the food to get stuck.

## 2020-10-21 NOTE — ED Notes (Signed)
Attempted to call patient's family x3 without success

## 2020-10-21 NOTE — ED Provider Notes (Signed)
[facility] EMERGENCY DEPT Provider Note   CSN: [id number] Arrival date & time: 10/21/20  1310     History Chief Complaint  Patient presents with   Swallowed Foreign Body    Jillian Harmon is a 70 y.o. female.  Patient is from [facility].  She was eating lunch when crackers got stuck in her esophagus that felt about midsternal area.  She did have emesis following this.  Patient had no voice change no coughing.  While waiting to be seen patient felt as if things went down she was able to swallow water and cola.  Without any difficulties.  The sensation of it being stuck is now gone.  Patient states she had something happen like this a long time ago but not recently.      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   Hypertension    per [facility] at [facility]   Hypoglycemia    Hypothyroidism    Memory difficulty 11/19/2015   Memory loss    Seasonal allergies    Seizures ([place]) 11/19/2015   Stroke [facility]) 2007   "dr's weren't sure if it was a stroke or seizure" (10/19/2015)   Syncope and collapse    Ventral hernia     Patient Active Problem List   Diagnosis Date Noted   Incarcerated incisional hernia s/p lap repair w mesh 01/30/2019 01/30/2019   Seizures ([place])  11/19/2015   Memory difficulty 11/19/2015   Faintness    Gait instability    Acute blood loss anemia 10/18/2015   Alcohol dependence ([place]) 10/18/2015   Symptomatic anemia     Past Surgical History:  Procedure Laterality Date   CHOLECYSTECTOMY OPEN     ESOPHAGOGASTRODUODENOSCOPY N/A 10/19/2015   Procedure: ESOPHAGOGASTRODUODENOSCOPY (EGD);  Surgeon: Manus Gunning, MD;  Location: [facility];  Service: Gastroenterology;  Laterality: N/A;   TONSILLECTOMY     VENTRAL HERNIA REPAIR N/A 01/30/2019   Procedure: LAPAROSCOPIC INCARCERATED INCISIONAL HERNIA  REPAIR WITH MESH;  Surgeon: Michael Boston, MD;  Location: [facility];  Service: General;  Laterality: N/A;     OB History   No obstetric history on file.     Family History  Problem Relation Age of Onset   Other Mother        Hypoglycemia   Cancer Father    Cancer Paternal Grandfather    Seizures Neg Hx     Social History   Tobacco Use   Smoking status: Never   Smokeless tobacco: Never  Vaping Use   Vaping Use: Never used  Substance Use Topics   Alcohol use: No    Alcohol/week: 0.0 standard drinks    Comment: Previous alcohol use, now none   Drug use: No  Home Medications Prior to Admission medications   Medication Sig Start Date End Date Taking? Authorizing Provider  acetaminophen (TYLENOL) 500 MG tablet Take 1,000 mg by mouth every 8 (eight) hours as needed for moderate pain or headache.    [provider]  escitalopram (LEXAPRO) 20 MG tablet Take 20 mg by mouth daily.    [provider]  fluticasone (FLONASE SENSIMIST) 27.5 MCG/SPRAY nasal spray Place 2 sprays into the nose daily.    [provider]  guaifenesin (GNP TUSSIN) 100 MG/5ML syrup Take 200 mg by mouth every 4 (four) hours as needed for cough or congestion.    [provider]  ibuprofen (ADVIL) 200 MG tablet Take 400 mg by mouth every 8 (eight) hours as needed for moderate pain. Not relieved by tylenol (may give as  soon as 1 hour after tylenol)    [provider]  levETIRAcetam (KEPPRA) 500 MG tablet Take 500 mg by mouth every 12 (twelve) hours.    [provider]  loratadine (CLARITIN) 10 MG tablet Take 10 mg by mouth daily.    [provider]  LORazepam (ATIVAN) 0.5 MG tablet Take 0.5 mg by mouth 3 (three) times daily. 2122,4825,0037 May take 0.5 mg every 6 hours as needed for anxiety (may give an hour before or after scheduled dose)    [provider]  Multiple Vitamins-Minerals (CENTRUM SILVER PO) Take 1 tablet by mouth daily.    [provider]  traMADol (ULTRAM) 50 MG tablet Take 1-2 tablets (50-100 mg total) by mouth every 6 (six) hours as needed for moderate pain or severe pain. 01/30/19   Michael Boston, MD    Allergies    Sertraline  Review of Systems   Review of Systems  Constitutional:  Negative for chills and fever.  HENT:  Positive for trouble swallowing. Negative for ear pain and sore throat.   Eyes:  Negative for pain and visual disturbance.  Respiratory:  Negative for cough and shortness of breath.   Cardiovascular:  Negative for chest pain and palpitations.  Gastrointestinal:  Positive for vomiting. Negative for abdominal pain.  Genitourinary:  Negative for dysuria and hematuria.  Musculoskeletal:  Negative for arthralgias and back pain.  Skin:  Negative for color change and rash.  Neurological:  Negative for seizures and syncope.  All other systems reviewed and are negative.  Physical Exam Updated Vital Signs BP 138/79 (BP Location: Left Arm)   Pulse 70   Temp 98.7 F (37.1 C)   Resp 16   SpO2 97%   Physical Exam Vitals and nursing note reviewed.  Constitutional:      General: She is not in acute distress.    Appearance: Normal appearance. She is well-developed.  HENT:     Head: Normocephalic and atraumatic.  Eyes:     Extraocular Movements: Extraocular movements intact.     Conjunctiva/sclera: Conjunctivae normal.      Pupils: Pupils are equal, round, and reactive to light.  Cardiovascular:     Rate and Rhythm: Normal rate and regular rhythm.     Heart sounds: No murmur heard. Pulmonary:     Effort: Pulmonary effort is normal. No respiratory distress.     Breath sounds: Normal breath sounds. No wheezing or rales.  Chest:     Chest wall: No tenderness.  Abdominal:     Palpations: Abdomen is soft.     Tenderness: There is no abdominal tenderness.  Musculoskeletal:     Cervical back: Neck supple.  Skin:  General: Skin is warm and dry.  Neurological:     General: No focal deficit present.     Mental Status: She is alert. Mental status is at baseline.    ED Results / Procedures / Treatments   Labs (all labs ordered are listed, but only abnormal results are displayed) Labs Reviewed - No data to display  EKG None  Radiology No results found.  Procedures Procedures   Medications Ordered in ED Medications - No data to display  ED Course  I have reviewed the triage vital signs and the nursing notes.  Pertinent labs & imaging results that were available during my care of the patient were reviewed by me and considered in my medical decision making (see chart for details).    MDM Rules/Calculators/A&P                          Patient now able to eat and swallow.  We did give her a trial of crackers and soda.  She did fine.  Patient stable for discharge back to [facility].  Would recommend follow-up with gastroenterology for further evaluation. Final Clinical Impression(s) / ED Diagnoses Final diagnoses:  Esophageal foreign body, initial encounter    Rx / DC Orders ED Discharge Orders     None        Fredia Sorrow, MD 10/21/20 [phone]

## 2021-11-06 ENCOUNTER — Other Ambulatory Visit: Payer: Self-pay

## 2021-11-06 ENCOUNTER — Emergency Department (HOSPITAL_COMMUNITY)
Admission: EM | Admit: 2021-11-06 | Discharge: 2021-11-06 | Disposition: A | Discharge status: Home / Self Care | Payer: Medicare Other | Attending: Emergency Medicine | Admitting: Emergency Medicine

## 2021-11-06 ENCOUNTER — Emergency Department (HOSPITAL_COMMUNITY): Payer: Medicare Other

## 2021-11-06 ENCOUNTER — Encounter (HOSPITAL_COMMUNITY): Payer: Self-pay | Admitting: *Deleted

## 2021-11-06 DIAGNOSIS — S0081XA Abrasion of other part of head, initial encounter: Secondary | ICD-10-CM | POA: Diagnosis not present

## 2021-11-06 DIAGNOSIS — S0990XA Unspecified injury of head, initial encounter: Secondary | ICD-10-CM | POA: Diagnosis present

## 2021-11-06 DIAGNOSIS — W19XXXA Unspecified fall, initial encounter: Secondary | ICD-10-CM

## 2021-11-06 DIAGNOSIS — W01198A Fall on same level from slipping, tripping and stumbling with subsequent striking against other object, initial encounter: Secondary | ICD-10-CM | POA: Insufficient documentation

## 2021-11-06 HISTORY — DX: Traumatic subdural hemorrhage with loss of consciousness status unknown, initial encounter: S06.5XAA

## 2021-11-06 LAB — BASIC METABOLIC PANEL
Anion gap: 9 (ref 5–15)
BUN: 16 mg/dL (ref 8–23)
CO2: 24 mmol/L (ref 22–32)
Calcium: 9.5 mg/dL (ref 8.9–10.3)
Chloride: 109 mmol/L (ref 98–111)
Creatinine, Ser: 0.8 mg/dL (ref 0.44–1.00)
GFR, Estimated: >60 mL/min (ref >60)
Glucose, Bld: 89 mg/dL (ref 70–99)
Potassium: 3.9 mmol/L (ref 3.5–5.1)
Sodium: 142 mmol/L (ref 135–145)

## 2021-11-06 LAB — CBC
HCT: 38.5 % (ref 36.0–46.0)
Hemoglobin: 12.5 g/dL (ref 12.0–15.0)
MCH: 31.4 pg (ref 26.0–34.0)
MCHC: 32.5 g/dL (ref 30.0–36.0)
MCV: 96.7 fL (ref 80.0–100.0)
Platelets: 191 10*3/uL (ref 150–400)
RBC: 3.98 MIL/uL (ref 3.87–5.11)
RDW: 12.8 % (ref 11.5–15.5)
WBC: 6 10*3/uL (ref 4.0–10.5)
nRBC: 0 % (ref 0.0–0.2)

## 2021-11-06 MED ORDER — BACITRACIN ZINC 500 UNIT/GM EX OINT
TOPICAL_OINTMENT | CUTANEOUS | Status: AC
  Administered 2021-11-06: 1 via TOPICAL
  Filled 2021-11-06: qty 0.9

## 2021-11-06 NOTE — Discharge Instructions (Signed)
Please keep the abrasions your forehead clean with warm soap and water dab dry and keep the area moist with Vaseline, bacitracin, Neosporin or other.

## 2021-11-06 NOTE — ED Notes (Signed)
Pt had multiple instances of wandering around the triage area and attempting to follow this writer into the nurse's station and into the med room. Pt has been less directable at this point. Pt still waiting on [facility] transport. Pt currently sitting with charge RN at desk eating a popsicle.
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
## 2021-11-06 NOTE — ED Notes (Signed)
[facility] place and gave report to [employer]

## 2021-11-06 NOTE — ED Notes (Signed)
Spoke with Ashok Norris, pts son to make family aware pt is in the ED.

## 2021-11-06 NOTE — ED Notes (Signed)
[facility] has been called to transport pt back to [facility].
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
## 2021-11-06 NOTE — ED Notes (Signed)
Attempted to call [facility], phone service broken and would not allow call to go to option to speak with someone to give update.
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
## 2023-03-25 ENCOUNTER — Emergency Department (HOSPITAL_COMMUNITY)
Admission: EM | Admit: 2023-03-25 | Discharge: 2023-03-27 | Disposition: A | Discharge status: Home / Self Care | Payer: Medicare Other | Attending: Emergency Medicine | Admitting: Emergency Medicine

## 2023-03-25 ENCOUNTER — Other Ambulatory Visit: Payer: Self-pay

## 2023-03-25 ENCOUNTER — Encounter (HOSPITAL_COMMUNITY): Payer: Self-pay | Admitting: Emergency Medicine

## 2023-03-25 ENCOUNTER — Emergency Department (HOSPITAL_COMMUNITY): Payer: Medicare Other

## 2023-03-25 DIAGNOSIS — F0390 Unspecified dementia without behavioral disturbance: Secondary | ICD-10-CM | POA: Diagnosis not present

## 2023-03-25 DIAGNOSIS — Z79899 Other long term (current) drug therapy: Secondary | ICD-10-CM | POA: Insufficient documentation

## 2023-03-25 DIAGNOSIS — R4689 Other symptoms and signs involving appearance and behavior: Secondary | ICD-10-CM

## 2023-03-25 DIAGNOSIS — R456 Violent behavior: Secondary | ICD-10-CM | POA: Diagnosis not present

## 2023-03-25 DIAGNOSIS — R319 Hematuria, unspecified: Secondary | ICD-10-CM | POA: Insufficient documentation

## 2023-03-25 DIAGNOSIS — Z8673 Personal history of transient ischemic attack (TIA), and cerebral infarction without residual deficits: Secondary | ICD-10-CM | POA: Diagnosis not present

## 2023-03-25 DIAGNOSIS — Z1152 Encounter for screening for COVID-19: Secondary | ICD-10-CM | POA: Diagnosis not present

## 2023-03-25 DIAGNOSIS — R110 Nausea: Secondary | ICD-10-CM | POA: Insufficient documentation

## 2023-03-25 DIAGNOSIS — I10 Essential (primary) hypertension: Secondary | ICD-10-CM | POA: Insufficient documentation

## 2023-03-25 DIAGNOSIS — F03918 Unspecified dementia, unspecified severity, with other behavioral disturbance: Secondary | ICD-10-CM | POA: Diagnosis not present

## 2023-03-25 DIAGNOSIS — R1030 Lower abdominal pain, unspecified: Secondary | ICD-10-CM | POA: Insufficient documentation

## 2023-03-25 DIAGNOSIS — F329 Major depressive disorder, single episode, unspecified: Secondary | ICD-10-CM | POA: Diagnosis present

## 2023-03-25 DIAGNOSIS — E039 Hypothyroidism, unspecified: Secondary | ICD-10-CM | POA: Insufficient documentation

## 2023-03-25 LAB — COMPREHENSIVE METABOLIC PANEL
ALT: 21 U/L (ref 0–44)
AST: 25 U/L (ref 15–41)
Albumin: 3.9 g/dL (ref 3.5–5.0)
Alkaline Phosphatase: 44 U/L (ref 38–126)
Anion gap: 9 (ref 5–15)
BUN: 20 mg/dL (ref 8–23)
CO2: 24 mmol/L (ref 22–32)
Calcium: 9.2 mg/dL (ref 8.9–10.3)
Chloride: 106 mmol/L (ref 98–111)
Creatinine, Ser: 0.73 mg/dL (ref 0.44–1.00)
GFR, Estimated: >60 mL/min (ref >60)
Glucose, Bld: 86 mg/dL (ref 70–99)
Potassium: 4.3 mmol/L (ref 3.5–5.1)
Sodium: 139 mmol/L (ref 135–145)
Total Bilirubin: 0.5 mg/dL (ref <1.2)
Total Protein: 7.2 g/dL (ref 6.5–8.1)

## 2023-03-25 LAB — URINALYSIS, W/ REFLEX TO CULTURE (INFECTION SUSPECTED)
Bilirubin Urine: NEGATIVE
Glucose, UA: NEGATIVE mg/dL
Ketones, ur: NEGATIVE mg/dL
Leukocytes,Ua: NEGATIVE
Nitrite: NEGATIVE
Protein, ur: NEGATIVE mg/dL
Specific Gravity, Urine: 1.011 (ref 1.005–1.030)
pH: 7 (ref 5.0–8.0)

## 2023-03-25 LAB — CBC WITH DIFFERENTIAL/PLATELET
Abs Immature Granulocytes: 0.02 10*3/uL (ref 0.00–0.07)
Basophils Absolute: 0.1 10*3/uL (ref 0.0–0.1)
Basophils Relative: 1 %
Eosinophils Absolute: 0.2 10*3/uL (ref 0.0–0.5)
Eosinophils Relative: 3 %
HCT: 39.4 % (ref 36.0–46.0)
Hemoglobin: 12.6 g/dL (ref 12.0–15.0)
Immature Granulocytes: 0 %
Lymphocytes Relative: 22 %
Lymphs Abs: 1.6 10*3/uL (ref 0.7–4.0)
MCH: 30.7 pg (ref 26.0–34.0)
MCHC: 32 g/dL (ref 30.0–36.0)
MCV: 95.9 fL (ref 80.0–100.0)
Monocytes Absolute: 0.5 10*3/uL (ref 0.1–1.0)
Monocytes Relative: 7 %
Neutro Abs: 4.9 10*3/uL (ref 1.7–7.7)
Neutrophils Relative %: 67 %
Platelets: 189 10*3/uL (ref 150–400)
RBC: 4.11 MIL/uL (ref 3.87–5.11)
RDW: 13.4 % (ref 11.5–15.5)
WBC: 7.3 10*3/uL (ref 4.0–10.5)
nRBC: 0 % (ref 0.0–0.2)

## 2023-03-25 LAB — SARS CORONAVIRUS 2 BY RT PCR: SARS Coronavirus 2 by RT PCR: NEGATIVE

## 2023-03-25 MED ORDER — OLANZAPINE 5 MG PO TBDP
5.0000 mg | ORAL_TABLET | Freq: Three times a day (TID) | ORAL | Status: DC | PRN
  Administered 2023-03-25 – 2023-03-26 (×2): 5 mg via ORAL
  Filled 2023-03-25 (×2): qty 1

## 2023-03-25 MED ORDER — ZIPRASIDONE MESYLATE 20 MG IM SOLR: 20.0000 mg | INTRAMUSCULAR | Status: DC | PRN

## 2023-03-25 MED ORDER — LORAZEPAM 1 MG PO TABS
1.0000 mg | ORAL_TABLET | ORAL | Status: AC | PRN
  Administered 2023-03-25: 1 mg via ORAL
  Filled 2023-03-25: qty 1
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
## 2023-03-25 NOTE — ED Notes (Signed)
Psych NP at bedside

## 2023-03-25 NOTE — Discharge Instructions (Addendum)
Follow up with your doctor

## 2023-03-25 NOTE — Consult Note (Signed)
[facility] ED ASSESSMENT    Reason for Consult: Psych Consult Referring Physician: Dr. Jearld Harmon Patient Identification: Jillian Harmon MRN:  [id number] ED Chief Complaint: Dementia with behavioral disturbance [facility])  Diagnosis:  Principal Problem:   Dementia with behavioral disturbance (HCC) Active Problems:   MDD (major depressive disorder)   ED Assessment Time Calculation: Start Time: 1630 Stop Time: 1700 Total Time in Minutes (Assessment Completion): 30   Subjective: Jillian Harmon is a 72 y.o. female patient BIB EMS from [facility], c/o aggressive behavior toward staff and other residents. Staff suspects UTI r/t history. Hx of dementia and anxiety.   HPI: Jillian Harmon, 72 y.o., female patient seen face to face by this provider, consulted with Dr. Jannifer Harmon; and chart reviewed on 03/25/23.  On evaluation Jillian Harmon is sitting up in her bed and appears to be in moderate distress, about talking with her husband and son about her whereabouts, so they can know she is safe.  She is alert, oriented to self and environment, she knows she is in the hospital was not sure what where.  She states that her mood is  good, states that she is ready to leave the hospital, states that she has been here for over 5 hours, and no one has told her what was going on while she was here. Patient is confusing to talk to at times.  It is unclear about how she got down to [place]. She states at one moment her husband and sons dropped her off here, then she says they dont know where she is.  This provider discussed with her that, the hospital and Dr. wanted to get some routine lab work from her, patient able to calm down and relax a little more and told me that she lives with her husband Jillian Harmon, and that they have 2 adult sons Jillian Harmon and Jillian Harmon.  She also states that she feels safe at home and she is a principal of a school, she has been in the educational field for about 24 years.  She  also states that she is 72 years old, currently denies SI/HI/AVH.  When this provider asked if she had any past psychiatric history, she states that she has a history of depression, states that she is effective antidepressant but does not take any medication for her depression anymore. Patient states she has no idea what [address] is, she is not familiar with the name. Patient denies using or ever using any illicit drugs or alcohol. She has normal speech, and appropriate behavior.  Objectively there is no evidence of psychosis/mania or delusional thinking.  She denies suicidal/self-harm/homicidal ideation, psychosis, and paranoia.  Patient is pleasantly confused.  Spoke with Jillian Harmon, medication tech, from [facility], she states she has been monitored patient to the emergency department due to aggressive behavior, states patient's behavior has changed, and they feel that it may be due to a lingering urinary tract infection.  She states that patient has always been aggressive and anxious towards staff, she states but today she has been aggressive, yelling, screaming, and pushing residents and residents family members.  She states that Jillian Harmon is the psychiatric provider who comes in on Wednesdays and adjust medications.  This provider spoke with her about adding an antipsychotic, with a mood stabilizer to manage agitation, combativeness and reduce demented behaviors in a person with dementia. Jillian Harmon was in agreement.  Attempted to get in contact with patient legal guardian and son Jillian Harmon,  no answer left  a HIPAA compliant voicemail.   Past Psychiatric History: depression  Risk to Self or Others: Risk to Self:  No Risk to Others:  Yes due to aggressive behaviors towards others associated with dementia.   Prior Inpatient Therapy: Unknown   Prior Outpatient Therapy: Unknown     [place] Scale:  Flowsheet Row ED from 03/25/2023 in [facility] Emergency Department at [facility] ED from 11/06/2021 in [facility] Emergency Department at [facility] ED from 10/21/2020 in [facility] Emergency Department at [facility]  C-SSRS RISK CATEGORY No Risk No Risk No Risk       AIMS:  , , ,  ,   ASAM:    Substance Abuse:     Past Medical History:  Past Medical History:  Diagnosis Date   Alcohol related seizure (HCC)    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   Alcohol-induced persisting dementia (HCC)    Anemia    Anxiety    Closed head injury 2015; early 2017   "fell in my house"   Delirium, withdrawal, alcoholic (HCC) 2012   Depression    Gastrointestinal hemorrhage associated with gastric ulcer    H/O ETOH abuse    "has not had anything to 2 months" (10/2015)   History of blood transfusion 10/18/2015   "anemia"   History of esophagogastroduodenoscopy (EGD) 07/2013   erosive gastritis, no varices ([facility])   History of gastric ulcer    History of subdural hematoma 07/2017   right subdural hematoma extending between the leaves of tentorium on the right side.   Hypertension    per Gerrit Heck at [facility]   Hypoglycemia    Hypothyroidism    Memory difficulty 11/19/2015   Memory loss    Seasonal allergies    Seizures (HCC) 11/19/2015   Stroke [facility]) 2007   "dr's weren't sure if it was a stroke or seizure" (10/19/2015)   Subdural hematoma (HCC)    Syncope and collapse    Ventral hernia     Past Surgical History:  Procedure Laterality Date   CHOLECYSTECTOMY OPEN     ESOPHAGOGASTRODUODENOSCOPY N/A 10/19/2015   Procedure: ESOPHAGOGASTRODUODENOSCOPY (EGD);  Surgeon: Ruffin Frederick, MD;  Location: [facility] ENDOSCOPY;  Service: Gastroenterology;  Laterality: N/A;   TONSILLECTOMY     VENTRAL HERNIA REPAIR N/A 01/30/2019   Procedure: LAPAROSCOPIC INCARCERATED INCISIONAL HERNIA  REPAIR WITH MESH;  Surgeon: Karie Soda, MD;  Location: [facility] ORS;  Service: General;  Laterality: N/A;   Family History:  Family History  Problem  Relation Age of Onset   Other Mother        Hypoglycemia   Cancer Father    Cancer Paternal Grandfather    Seizures Neg Hx     Social History:  Social History   Substance and Sexual Activity  Alcohol Use No   Alcohol/week: 0.0 standard drinks of alcohol   Comment: Previous alcohol use, now none     Social History   Substance and Sexual Activity  Drug Use No    Social History   Socioeconomic History   Marital status: Widowed    Spouse name: Not on file   Number of children: 2   Years of education: Doctorate   Highest education level: Not on file  Occupational History   Occupation: N/A  Tobacco Use   Smoking status: Never   Smokeless tobacco: Never  Vaping Use   Vaping status: Never Used  Substance and Sexual Activity   Alcohol use: No  Alcohol/week: 0.0 standard drinks of alcohol    Comment: Previous alcohol use, now none   Drug use: No   Sexual activity: Not Currently  Other Topics Concern   Not on file  Social History Narrative   Currently staying w/ her son    Right-handed   Drinks between 1 and 6 cups of black coffee or sodas per day   Social Determinants of Health   Financial Resource Strain: Not on file  Food Insecurity: Not on file  Transportation Needs: Not on file  Physical Activity: Not on file  Stress: Not on file  Social Connections: Not on file    Allergies:   Allergies  Allergen Reactions   Sertraline Other (See Comments)     Causes hallucinations/paranoid thoughts    Labs:  Results for orders placed or performed during the hospital encounter of 03/25/23 (from the past 48 hour(s))  SARS Coronavirus 2 by RT PCR (hospital order, performed in [facility] hospital lab) *cepheid single result test* Anterior Nasal Swab     Status: None   Collection Time: 03/25/23  1:48 PM   Specimen: Anterior Nasal Swab  Result Value Ref Range   SARS Coronavirus 2 by RT PCR NEGATIVE NEGATIVE    Comment: (NOTE) SARS-CoV-2 target nucleic acids are NOT  DETECTED.  The SARS-CoV-2 RNA is generally detectable in upper and lower respiratory specimens during the acute phase of infection. The lowest concentration of SARS-CoV-2 viral copies this assay can detect is 250 copies / mL. A negative result does not preclude SARS-CoV-2 infection and should not be used as the sole basis for treatment or other patient management decisions.  A negative result may occur with improper specimen collection / handling, submission of specimen other than nasopharyngeal swab, presence of viral mutation(s) within the areas targeted by this assay, and inadequate number of viral copies (<250 copies / mL). A negative result must be combined with clinical observations, patient history, and epidemiological information.  Fact Sheet for Patients:   RoadLapTop.co.za  Fact Sheet for Healthcare Providers: http://kim-miller.com/  This test is not yet approved or  cleared by the [place] FDA and has been authorized for detection and/or diagnosis of SARS-CoV-2 by FDA under an Emergency Use Authorization (EUA).  This EUA will remain in effect (meaning this test can be used) for the duration of the COVID-19 declaration under Section 564(b)(1) of the Act, 21 U.S.C. section 360bbb-3(b)(1), unless the authorization is terminated or revoked sooner.  Performed at [facility], [address] [place], [place] [address]   CBC with Differential     Status: None   Collection Time: 03/25/23  2:30 PM  Result Value Ref Range   WBC 7.3 4.0 - 10.5 K/uL   RBC 4.11 3.87 - 5.11 MIL/uL   Hemoglobin 12.6 12.0 - 15.0 g/dL   HCT 65.7 84.6 - 96.2 %   MCV 95.9 80.0 - 100.0 fL   MCH 30.7 26.0 - 34.0 pg   MCHC 32.0 30.0 - 36.0 g/dL   RDW 95.2 84.1 - 32.4 %   Platelets 189 150 - 400 K/uL   nRBC 0.0 0.0 - 0.2 %   Neutrophils Relative % 67 %   Neutro Abs 4.9 1.7 - 7.7 K/uL   Lymphocytes Relative 22 %   Lymphs Abs 1.6  0.7 - 4.0 K/uL   Monocytes Relative 7 %   Monocytes Absolute 0.5 0.1 - 1.0 K/uL   Eosinophils Relative 3 %   Eosinophils Absolute 0.2 0.0 -  0.5 K/uL   Basophils Relative 1 %   Basophils Absolute 0.1 0.0 - 0.1 K/uL   Immature Granulocytes 0 %   Abs Immature Granulocytes 0.02 0.00 - 0.07 K/uL    Comment: Performed at [facility], [address], [place], [place] [address]  Comprehensive metabolic panel     Status: None   Collection Time: 03/25/23  2:30 PM  Result Value Ref Range   Sodium 139 135 - 145 mmol/L   Potassium 4.3 3.5 - 5.1 mmol/L   Chloride 106 98 - 111 mmol/L   CO2 24 22 - 32 mmol/L   Glucose, Bld 86 70 - 99 mg/dL    Comment: Glucose reference range applies only to samples taken after fasting for at least 8 hours.   BUN 20 8 - 23 mg/dL   Creatinine, Ser 6.04 0.44 - 1.00 mg/dL   Calcium 9.2 8.9 - 54.0 mg/dL   Total Protein 7.2 6.5 - 8.1 g/dL   Albumin 3.9 3.5 - 5.0 g/dL   AST 25 15 - 41 U/L   ALT 21 0 - 44 U/L   Alkaline Phosphatase 44 38 - 126 U/L   Total Bilirubin 0.5 <1.2 mg/dL   GFR, Estimated >98 >11 mL/min    Comment: (NOTE) Calculated using the CKD-EPI Creatinine Equation (2021)    Anion gap 9 5 - 15    Comment: Performed at [facility], [address], [place], [place] [address]  Urinalysis, w/ Reflex to Culture (Infection Suspected) -Urine, Clean Catch     Status: Abnormal   Collection Time: 03/25/23  2:46 PM  Result Value Ref Range   Specimen Source URINE, CLEAN CATCH    Color, Urine STRAW (A) YELLOW   APPearance CLEAR CLEAR   Specific Gravity, Urine 1.011 1.005 - 1.030   pH 7.0 5.0 - 8.0   Glucose, UA NEGATIVE NEGATIVE mg/dL   Hgb urine dipstick SMALL (A) NEGATIVE   Bilirubin Urine NEGATIVE NEGATIVE   Ketones, ur NEGATIVE NEGATIVE mg/dL   Protein, ur NEGATIVE NEGATIVE mg/dL   Nitrite NEGATIVE NEGATIVE   Leukocytes,Ua NEGATIVE NEGATIVE   RBC / HPF 21-50 0 - 5 RBC/hpf   WBC, UA 0-5 0 - 5 WBC/hpf    Comment:         Reflex urine culture not performed if WBC <=10, OR if Squamous epithelial cells >5. If Squamous epithelial cells >5 suggest recollection.    Bacteria, UA RARE (A) NONE SEEN   Squamous Epithelial / HPF 0-5 0 - 5 /HPF    Comment: Performed at [facility], [address] [place], [place] [address]    No current facility-administered medications for this encounter.   Current Outpatient Medications  Medication Sig Dispense Refill   acetaminophen (TYLENOL) 500 MG tablet Take 1,000 mg by mouth every 8 (eight) hours as needed for moderate pain or headache.     escitalopram (LEXAPRO) 20 MG tablet Take 20 mg by mouth daily.     fluticasone (FLONASE SENSIMIST) 27.5 MCG/SPRAY nasal spray Place 2 sprays into the nose daily.     guaifenesin (GNP TUSSIN) 100 MG/5ML syrup Take 200 mg by mouth every 4 (four) hours as needed for cough or congestion.     ibuprofen (ADVIL) 200 MG tablet Take 400 mg by mouth every 8 (eight) hours as needed for moderate pain. Not relieved by tylenol (may give as soon as 1 hour after tylenol)     levETIRAcetam (KEPPRA) 500 MG tablet Take 500  mg by mouth every 12 (twelve) hours.     loratadine (CLARITIN) 10 MG tablet Take 10 mg by mouth daily.     LORazepam (ATIVAN) 0.5 MG tablet Take 0.5 mg by mouth 3 (three) times daily. 0500,1200,1700 May take 0.5 mg every 6 hours as needed for anxiety (may give an hour before or after scheduled dose)     Multiple Vitamins-Minerals (CENTRUM SILVER PO) Take 1 tablet by mouth daily.     traMADol (ULTRAM) 50 MG tablet Take 1-2 tablets (50-100 mg total) by mouth every 6 (six) hours as needed for moderate pain or severe pain. 20 tablet 0    Musculoskeletal:  Observed patient resting in bed.    Psychiatric Specialty Exam: Presentation  General Appearance:  Appropriate for Environment  Eye Contact: Good  Speech: Clear and Coherent  Speech Volume: Normal  Handedness: Right   Mood and Affect   Mood: Anxious; Irritable  Affect: Flat; Appropriate   Thought Process  Thought Processes: Coherent  Descriptions of Associations:Loose  Orientation:Partial (patient has dementia)  Thought Content:Scattered; WDL  History of Schizophrenia/Schizoaffective disorder:No data recorded Duration of Psychotic Symptoms:No data recorded Hallucinations:Hallucinations: None  Ideas of Reference:None  Suicidal Thoughts:Suicidal Thoughts: No  Homicidal Thoughts:Homicidal Thoughts: No   Sensorium  Memory: Immediate Poor; Remote Fair; Recent Fair  Judgment: Impaired  Insight: Fair   Chartered certified accountant: Fair  Attention Span: Fair  Recall: [employer] of Knowledge: Fair  Language: Fair   Psychomotor Activity  Psychomotor Activity: Psychomotor Activity: Normal   Assets  Assets: Manufacturing systems engineer; Social Support; Desire for Improvement; Housing    Sleep  Sleep: Sleep: Fair (patient unable to recall how many hours she sleeps at night)   Physical Exam: Physical Exam Vitals and nursing note reviewed. Exam conducted with a chaperone present.  Neurological:     Mental Status: She is alert.  Psychiatric:        Attention and Perception: Attention normal.        Mood and Affect: Affect normal. Mood is anxious.        Speech: Speech normal.        Behavior: Behavior is cooperative.        Thought Content: Thought content normal.        Cognition and Memory: Memory is impaired.        Judgment: Judgment is inappropriate.    Review of Systems  Constitutional: Negative.   Psychiatric/Behavioral:  Positive for memory loss.    Blood pressure (!) 149/82, pulse 81, temperature 98.7 F (37.1 C), temperature source Oral, resp. rate (!) 28, SpO2 97%. There is no height or weight on file to calculate BMI.    Medical Decision Making: Will continue to observe patient overnight and reassess in morning until we are able to get in contact with her  legal guardian Jacari Kolesnikov, and able to talk with him about possibly adding a mood stabilizer and a antipsychotic to help manage agitation, combativeness and reduce demented behaviors in a person with dementia.    Alona Bene, PMHNP 03/25/2023 5:10 PM

## 2023-03-26 DIAGNOSIS — F03918 Unspecified dementia, unspecified severity, with other behavioral disturbance: Secondary | ICD-10-CM | POA: Diagnosis not present

## 2023-03-26 DIAGNOSIS — F329 Major depressive disorder, single episode, unspecified: Secondary | ICD-10-CM | POA: Diagnosis not present

## 2023-03-26 LAB — URINE CULTURE: Culture: <10000 — AB

## 2023-03-26 MED ORDER — OLANZAPINE 5 MG PO TBDP: 2.5000 mg | ORAL_TABLET | Freq: Four times a day (QID) | ORAL | Status: DC | PRN

## 2023-03-26 MED ORDER — DIVALPROEX SODIUM 125 MG PO CSDR
125.0000 mg | DELAYED_RELEASE_CAPSULE | Freq: Three times a day (TID) | ORAL | Status: DC
  Administered 2023-03-26 – 2023-03-27 (×3): 125 mg via ORAL
  Filled 2023-03-26 (×4): qty 1

## 2023-03-26 MED ORDER — DIVALPROEX SODIUM 125 MG PO CSDR
125.0000 mg | DELAYED_RELEASE_CAPSULE | Freq: Two times a day (BID) | ORAL | Status: DC
  Administered 2023-03-26: 125 mg via ORAL
  Filled 2023-03-26: qty 1

## 2023-03-26 MED ORDER — QUETIAPINE FUMARATE 25 MG PO TABS: 12.5000 mg | ORAL_TABLET | Freq: Two times a day (BID) | ORAL | Status: DC | PRN

## 2023-03-26 NOTE — ED Notes (Signed)
3 bags of belongings placed in locker 29.  Patient had a key on a string around her neck.  It was placed in one of the bags.

## 2023-03-26 NOTE — ED Provider Notes (Signed)
Emergency Medicine Observation Re-evaluation Note  Jillian Harmon is a 72 y.o. female, seen on rounds today.  Pt initially presented to the ED for complaints of Aggressive Behavior Currently, the patient is being observed with concern for increased agitation, combativeness in setting of dementia.  Physical Exam  BP 102/71 (BP Location: Right Arm)   Pulse 66   Temp 97.8 F (36.6 C) (Oral)   Resp 16   SpO2 98%  Physical Exam General: NAD Cardiac: RR Lungs: even unlabored Psych: NA  ED Course / MDM  EKG:EKG Interpretation Date/Time:  Sunday March 25 2023 13:43:43 EST Ventricular Rate:  76 PR Interval:  155 QRS Duration:  89 QT Interval:  376 QTC Calculation: 423 R Axis:   51  Text Interpretation: Sinus rhythm Confirmed by Vivi Barrack [phone]) on 03/25/2023 2:06:18 PM  I have reviewed the labs performed to date as well as medications administered while in observation.  Recent changes in the last 24 hours include presented to ED, medically cleared.  Plan  Current plan is for psychiatry reevaluation.    Alvira Monday, MD 03/26/23 [phone]

## 2023-03-26 NOTE — ED Notes (Signed)
Patient oriented to person and place.  Patient disoriented to situation.  Writer asked patient why she was in hospital she stated, "Because they want to check different parts of my body."

## 2023-03-26 NOTE — ED Notes (Addendum)
This RN walked Pt back to TCU with all belongings(Blue purse and grey slip on shoes) in belongings bag.

## 2023-03-26 NOTE — ED Notes (Signed)
Return phone call from son Jillian Harmon who spoke directly to Jillian Harmon. This conversation appeared to calm her somewhat but the minute she got off the phone she began questioning staff as to when she was leaving and that she had been promised that she would be discharged on Sunday and became very angry when informed that the date was Monday the 18th. Jillian Harmon angrily asked to speak with [employer]. She continued to question staff over and over the same questions and would not accept our answers . Pt affect is very flat and mood is very labile but no episodes of physical aggression have been witnessed.

## 2023-03-26 NOTE — ED Notes (Signed)
Pt constantly hollering and pushing call bell button because she is upset that the bed alarm is set on and when she tries to get up is reminded by bed alarm to remain in the bed.PRN Zyprexa given, will continue to monitor and medicate prn.

## 2023-03-26 NOTE — ED Notes (Signed)
Patient upset. Worried about family not knowing she is here.  Patient continues to state, wants to leave.

## 2023-03-26 NOTE — ED Notes (Addendum)
Attempting to push pass sitter who was siting in the door way. Affect appears very angry and cross with staff asking who brought her hereand why. She was informed that due to her aggressive behavior at the SNF she lives she was brought to the ED to r/o her having a UTI. Phone calls placed to both son's [name] and Mardelle Matte with message left on Andy's answer service.

## 2023-03-26 NOTE — BH Assessment (Addendum)
Disposition Note:   Per [facility] provider, Earney Navy, NP, will start patient on two Medications today, monitor overnight and send back to facility tomorrow.

## 2023-03-26 NOTE — Progress Notes (Cosign Needed Addendum)
[facility] Acute Care Psych ED Progress Note  03/26/2023 11:49 AM Jillian Harmon  MRN:  [id number]   Subjective:  Jillian Harmon is a 72 y.o. female patient BIB EMS from [facility], c/o aggressive behavior toward staff and other residents. Staff suspects UTI r/t history. Hx of dementia and anxiety.  Patient was met this morning awake and alert and eating breakfast.  She is able to feed herself.  She gave her full name but does not know the name of the place she came from.  She says she came from home.  She does not know the name of the facility she comes from and the name of the place,  Nursing reports that patient has mood lability changing from being calm and cooperative to gradually confused and wanting to go home.  Constantly she requires.frequent redirecting. Provider called son, Kaiya Morong was called to discuss starting his mother on Low dose Mood stabilizers.  We discussed the use , benefits and side effect of Depakote and Seroquel..  He agrees to start both Medications.  Calls to [facility] was not answered.  Mardelle Matte states he will inform his brother what the plan for the two medications are for.   Low dose Depakote Sprinkle and Olanzapine started. Principal Problem: Dementia with behavioral disturbance (HCC) Diagnosis:  Principal Problem:   Dementia with behavioral disturbance (HCC) Active Problems:   MDD (major depressive disorder)   ED Assessment Time Calculation: Start Time: 1049 Stop Time: 1108 Total Time in Minutes (Assessment Completion): 19   Past Psychiatric History: see initial Psychiatry evaluation note   [place] Scale:  Flowsheet Row ED from 03/25/2023 in [facility] Emergency Department at [facility] ED from 11/06/2021 in [facility] Emergency Department at [facility] ED from 10/21/2020 in [facility] Emergency Department at [facility]  C-SSRS RISK CATEGORY No Risk No Risk No Risk       Past Medical History:  Past Medical History:   Diagnosis Date   Alcohol related seizure (HCC)    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   Alcohol-induced persisting dementia (HCC)    Anemia    Anxiety    Closed head injury 2015; early 2017   "fell in my house"   Delirium, withdrawal, alcoholic (HCC) 2012   Depression    Gastrointestinal hemorrhage associated with gastric ulcer    H/O ETOH abuse    "has not had anything to 2 months" (10/2015)   History of blood transfusion 10/18/2015   "anemia"   History of esophagogastroduodenoscopy (EGD) 07/2013   erosive gastritis, no varices ([facility])   History of gastric ulcer    History of subdural hematoma 07/2017   right subdural hematoma extending between the leaves of tentorium on the right side.   Hypertension    per Gerrit Heck at [facility]   Hypoglycemia    Hypothyroidism    Memory difficulty 11/19/2015   Memory loss    Seasonal allergies    Seizures (HCC) 11/19/2015   Stroke [facility]) 2007   "dr's weren't sure if it was a stroke or seizure" (10/19/2015)   Subdural hematoma (HCC)    Syncope and collapse    Ventral hernia     Past Surgical History:  Procedure Laterality Date   CHOLECYSTECTOMY OPEN     ESOPHAGOGASTRODUODENOSCOPY N/A 10/19/2015   Procedure: ESOPHAGOGASTRODUODENOSCOPY (EGD);  Surgeon: Ruffin Frederick, MD;  Location: [facility] ENDOSCOPY;  Service: Gastroenterology;  Laterality: N/A;   TONSILLECTOMY     VENTRAL HERNIA REPAIR N/A 01/30/2019  Procedure: LAPAROSCOPIC INCARCERATED INCISIONAL HERNIA  REPAIR WITH MESH;  Surgeon: Karie Soda, MD;  Location: [facility] ORS;  Service: General;  Laterality: N/A;   Family History:  Family History  Problem Relation Age of Onset   Other Mother        Hypoglycemia   Cancer Father    Cancer Paternal Grandfather    Seizures Neg Hx    Family Psychiatric  History: see initial evaluation note Social History:  Social History   Substance and Sexual Activity  Alcohol Use No   Alcohol/week: 0.0 standard drinks of  alcohol   Comment: Previous alcohol use, now none     Social History   Substance and Sexual Activity  Drug Use No    Social History   Socioeconomic History   Marital status: Widowed    Spouse name: Not on file   Number of children: 2   Years of education: Doctorate   Highest education level: Not on file  Occupational History   Occupation: N/A  Tobacco Use   Smoking status: Never   Smokeless tobacco: Never  Vaping Use   Vaping status: Never Used  Substance and Sexual Activity   Alcohol use: No    Alcohol/week: 0.0 standard drinks of alcohol    Comment: Previous alcohol use, now none   Drug use: No   Sexual activity: Not Currently  Other Topics Concern   Not on file  Social History Narrative   Currently staying w/ her son    Right-handed   Drinks between 1 and 6 cups of black coffee or sodas per day   Social Determinants of Health   Financial Resource Strain: Not on file  Food Insecurity: Not on file  Transportation Needs: Not on file  Physical Activity: Not on file  Stress: Not on file  Social Connections: Not on file    Sleep: Fair  Appetite:  Good  Current Medications: Current Facility-Administered Medications  Medication Dose Route Frequency Provider Last Rate Last Admin   divalproex (DEPAKOTE SPRINKLE) capsule 125 mg  125 mg Oral TID Dahlia Byes C, NP       OLANZapine zydis (ZYPREXA) disintegrating tablet 5 mg  5 mg Oral Q8H PRN Motley-Mangrum, Jadeka A, PMHNP   5 mg at 03/25/23 2307   And   ziprasidone (GEODON) injection 20 mg  20 mg Intramuscular PRN Motley-Mangrum, Jadeka A, PMHNP       QUEtiapine (SEROQUEL) tablet 12.5 mg  12.5 mg Oral BID PRN Earney Navy, NP       Current Outpatient Medications  Medication Sig Dispense Refill   acetaminophen (TYLENOL) 500 MG tablet Take 1,000 mg by mouth every 8 (eight) hours as needed for moderate pain (pain score 4-6) or headache.     dextromethorphan-guaiFENesin (ROBITUSSIN-DM) 10-100 MG/5ML  liquid Take by mouth every 4 (four) hours as needed for cough.     diazepam (VALIUM) 5 MG tablet Take 5 mg by mouth 3 (three) times daily.     divalproex (DEPAKOTE SPRINKLE) 125 MG capsule Take 125 mg by mouth 3 (three) times daily.     feeding supplement, ENSURE COMPLETE, (ENSURE COMPLETE) LIQD Take 237 mLs by mouth 2 (two) times daily.     levothyroxine (SYNTHROID) 50 MCG tablet Take 50 mcg by mouth daily.     methimazole (TAPAZOLE) 5 MG tablet Take 5 mg by mouth 2 (two) times daily.     mirtazapine (REMERON) 7.5 MG tablet Take 7.5 mg by mouth at bedtime.     simvastatin (  ZOCOR) 40 MG tablet Take 40 mg by mouth daily.     venlafaxine (EFFEXOR) 75 MG tablet Take 75 mg by mouth daily.      Lab Results:  Results for orders placed or performed during the hospital encounter of 03/25/23 (from the past 48 hour(s))  SARS Coronavirus 2 by RT PCR (hospital order, performed in [facility] hospital lab) *cepheid single result test* Anterior Nasal Swab     Status: None   Collection Time: 03/25/23  1:48 PM   Specimen: Anterior Nasal Swab  Result Value Ref Range   SARS Coronavirus 2 by RT PCR NEGATIVE NEGATIVE    Comment: (NOTE) SARS-CoV-2 target nucleic acids are NOT DETECTED.  The SARS-CoV-2 RNA is generally detectable in upper and lower respiratory specimens during the acute phase of infection. The lowest concentration of SARS-CoV-2 viral copies this assay can detect is 250 copies / mL. A negative result does not preclude SARS-CoV-2 infection and should not be used as the sole basis for treatment or other patient management decisions.  A negative result may occur with improper specimen collection / handling, submission of specimen other than nasopharyngeal swab, presence of viral mutation(s) within the areas targeted by this assay, and inadequate number of viral copies (<250 copies / mL). A negative result must be combined with clinical observations, patient history, and epidemiological  information.  Fact Sheet for Patients:   RoadLapTop.co.za  Fact Sheet for Healthcare Providers: http://kim-miller.com/  This test is not yet approved or  cleared by the [place] FDA and has been authorized for detection and/or diagnosis of SARS-CoV-2 by FDA under an Emergency Use Authorization (EUA).  This EUA will remain in effect (meaning this test can be used) for the duration of the COVID-19 declaration under Section 564(b)(1) of the Act, 21 U.S.C. section 360bbb-3(b)(1), unless the authorization is terminated or revoked sooner.  Performed at [facility], [address] [place], [place] [address]   CBC with Differential     Status: None   Collection Time: 03/25/23  2:30 PM  Result Value Ref Range   WBC 7.3 4.0 - 10.5 K/uL   RBC 4.11 3.87 - 5.11 MIL/uL   Hemoglobin 12.6 12.0 - 15.0 g/dL   HCT 21.3 08.6 - 57.8 %   MCV 95.9 80.0 - 100.0 fL   MCH 30.7 26.0 - 34.0 pg   MCHC 32.0 30.0 - 36.0 g/dL   RDW 46.9 62.9 - 52.8 %   Platelets 189 150 - 400 K/uL   nRBC 0.0 0.0 - 0.2 %   Neutrophils Relative % 67 %   Neutro Abs 4.9 1.7 - 7.7 K/uL   Lymphocytes Relative 22 %   Lymphs Abs 1.6 0.7 - 4.0 K/uL   Monocytes Relative 7 %   Monocytes Absolute 0.5 0.1 - 1.0 K/uL   Eosinophils Relative 3 %   Eosinophils Absolute 0.2 0.0 - 0.5 K/uL   Basophils Relative 1 %   Basophils Absolute 0.1 0.0 - 0.1 K/uL   Immature Granulocytes 0 %   Abs Immature Granulocytes 0.02 0.00 - 0.07 K/uL    Comment: Performed at [facility], [address], [place], [place] [address]  Comprehensive metabolic panel     Status: None   Collection Time: 03/25/23  2:30 PM  Result Value Ref Range   Sodium 139 135 - 145 mmol/L   Potassium 4.3 3.5 - 5.1 mmol/L   Chloride 106 98 - 111 mmol/L   CO2 24 22 - 32  mmol/L   Glucose, Bld 86 70 - 99 mg/dL    Comment: Glucose reference range applies only to samples taken after fasting  for at least 8 hours.   BUN 20 8 - 23 mg/dL   Creatinine, Ser 1.61 0.44 - 1.00 mg/dL   Calcium 9.2 8.9 - 09.6 mg/dL   Total Protein 7.2 6.5 - 8.1 g/dL   Albumin 3.9 3.5 - 5.0 g/dL   AST 25 15 - 41 U/L   ALT 21 0 - 44 U/L   Alkaline Phosphatase 44 38 - 126 U/L   Total Bilirubin 0.5 <1.2 mg/dL   GFR, Estimated >04 >54 mL/min    Comment: (NOTE) Calculated using the CKD-EPI Creatinine Equation (2021)    Anion gap 9 5 - 15    Comment: Performed at [facility], [address], [place], [place] [address]  Urinalysis, w/ Reflex to Culture (Infection Suspected) -Urine, Clean Catch     Status: Abnormal   Collection Time: 03/25/23  2:46 PM  Result Value Ref Range   Specimen Source URINE, CLEAN CATCH    Color, Urine STRAW (A) YELLOW   APPearance CLEAR CLEAR   Specific Gravity, Urine 1.011 1.005 - 1.030   pH 7.0 5.0 - 8.0   Glucose, UA NEGATIVE NEGATIVE mg/dL   Hgb urine dipstick SMALL (A) NEGATIVE   Bilirubin Urine NEGATIVE NEGATIVE   Ketones, ur NEGATIVE NEGATIVE mg/dL   Protein, ur NEGATIVE NEGATIVE mg/dL   Nitrite NEGATIVE NEGATIVE   Leukocytes,Ua NEGATIVE NEGATIVE   RBC / HPF 21-50 0 - 5 RBC/hpf   WBC, UA 0-5 0 - 5 WBC/hpf    Comment:        Reflex urine culture not performed if WBC <=10, OR if Squamous epithelial cells >5. If Squamous epithelial cells >5 suggest recollection.    Bacteria, UA RARE (A) NONE SEEN   Squamous Epithelial / HPF 0-5 0 - 5 /HPF    Comment: Performed at [facility] [address] [place], [place] [address]    Blood Alcohol level:  Lab Results  Component Value Date   [facility] <10 04/26/2017   ETH <10 04/25/2017    Physical Findings:  CIWA:    COWS:     Musculoskeletal: Strength & Muscle Tone:  sitting in during interaction Gait & Station:  sitting in bed  Patient leans:  see above.  Psychiatric Specialty Exam:  Presentation  General Appearance:  Casual; Appropriate for Environment  Eye  Contact: Good  Speech: Clear and Coherent  Speech Volume: Normal  Handedness: Right   Mood and Affect  Mood: Anxious; Irritable  Affect: Appropriate   Thought Process  Thought Processes: Coherent  Descriptions of Associations:Loose  Orientation:Partial  Thought Content:Scattered; Illogical  History of Schizophrenia/Schizoaffective disorder:No data recorded Duration of Psychotic Symptoms:No data recorded Hallucinations:Hallucinations: None  Ideas of Reference:None  Suicidal Thoughts:Suicidal Thoughts: No  Homicidal Thoughts:Homicidal Thoughts: No   Sensorium  Memory: Immediate Poor; Recent Poor; Remote Poor  Judgment: Impaired  Insight: Lacking   Executive Functions  Concentration: Fair  Attention Span: Fair  Recall: Poor  Fund of Knowledge: Poor  Language: Fair   Psychomotor Activity  Psychomotor Activity: Psychomotor Activity: Normal   Assets  Assets: Social Support; Housing; Communication Skills   Sleep  Sleep: Sleep: Fair    Physical Exam: Physical Exam Vitals and nursing note reviewed.  Constitutional:      Appearance: Normal appearance.  HENT:     Nose: Nose normal.  Cardiovascular:  Rate and Rhythm: Normal rate and regular rhythm.  Pulmonary:     Effort: Pulmonary effort is normal.  Musculoskeletal:        General: Normal range of motion.  Skin:    General: Skin is dry.  Neurological:     Mental Status: She is alert.     Comments: Oriented to name and situation  Psychiatric:        Attention and Perception: Attention and perception normal.        Mood and Affect: Mood is anxious. Affect is angry.        Speech: Speech normal.        Behavior: Behavior is cooperative.        Thought Content: Thought content normal.        Judgment: Judgment is impulsive.    ROS-Unable to fully engage in meaningful conversation secondary to Dementia.-Memory/Cognitive impairment. Blood pressure 102/71, pulse 66,  temperature 97.8 F (36.6 C), temperature source Oral, resp. rate 16, SpO2 98%. There is no height or weight on file to calculate BMI.  Medical Decision Making: Mood is Labile, patient came in for aggression, confusion and disorientation.  UA is negative.  Permission is gotten from Jackqulyn Livings to start Depakote Sprinkle 125 mg po three a day and Seroquel 12.5 mg po as needed twice a day for mood and agitation. We start these two Medications today, monitor overnight and send back to facility tomorrow.Earney Navy, NP-PMHNP-BC 03/26/2023, 11:49 AM

## 2023-03-26 NOTE — ED Notes (Signed)
Patient approached nurse's station asking, "Do we have to go to the ceremony this morning?  I know they are going to have something."

## 2023-03-26 NOTE — ED Notes (Signed)
Patient alert. Patient confused.  Patient has limited insight. Patient talking about needing to go home.  Patient redirected and reassured.

## 2023-03-26 NOTE — ED Notes (Signed)
Pt frequently trying to get out of the bed. Bed alarm placed and activated.

## 2023-03-26 NOTE — ED Notes (Addendum)
Pt. got mad when rules of department sheet was given to her and she complained of wanting to leave to staff by standing in her room doorway making aggressively motions. Pt kept repeating over she was going leave Sunday and got upset that today is Monday. Pt was ask to move away from the doorway and go sit on her bed several times.  Pt did not comply , so finally 25 minutes later RN redirected pt back in the room to go to bed.

## 2023-03-26 NOTE — ED Notes (Addendum)
Patient is very confused and very repetitive with her questions over and over again and asking to call the police and a lawyer and her sons.

## 2023-03-27 DIAGNOSIS — F329 Major depressive disorder, single episode, unspecified: Secondary | ICD-10-CM | POA: Diagnosis not present

## 2023-03-27 DIAGNOSIS — F03918 Unspecified dementia, unspecified severity, with other behavioral disturbance: Secondary | ICD-10-CM | POA: Diagnosis not present

## 2023-03-27 MED ORDER — DIVALPROEX SODIUM 125 MG PO CSDR: 125.0000 mg | DELAYED_RELEASE_CAPSULE | Freq: Three times a day (TID) | ORAL | 0 refills | Status: DC

## 2023-03-27 MED ORDER — QUETIAPINE FUMARATE 25 MG PO TABS: 12.5000 mg | ORAL_TABLET | Freq: Two times a day (BID) | ORAL | 0 refills | Status: DC | PRN

## 2023-03-27 MED ORDER — QUETIAPINE FUMARATE 25 MG PO TABS: 12.5000 mg | ORAL_TABLET | Freq: Two times a day (BID) | ORAL | Status: DC | PRN

## 2023-03-27 NOTE — Discharge Summary (Signed)
[facility] Psych ED Discharge  03/27/2023 11:37 AM Jillian Harmon  MRN:  [id number]  Principal Problem: Dementia with behavioral disturbance [facility]) Discharge Diagnoses: Principal Problem:   Dementia with behavioral disturbance (HCC) Active Problems:   MDD (major depressive disorder)  Clinical Impression:  Final diagnoses:  Aggressive behavior  Hematuria, unspecified type   Subjective:  Jillian Harmon is a 72 y.o. female patient BIB EMS from [facility], c/o aggressive behavior toward staff and other residents. Staff suspects UTI r/t history. Hx of dementia and anxiety.    Patient was seen this morning awake, alert and oriented to her name and environment.  Patient admits that she is in the hospital but does not know which one.  She reports eating the best breakfast and named what she ate.  She is happy that she is leaving today.  Patient has not needed PRN Seroquel since yesterday it was prescribed.  She is compliant with her Depakote.  She denies SI/HI/AVH. Provider spoke to MS Tamala Bari at [facility] who agrees that patient should come back. With the condition that list of Medication changes made-addition added and FLO2 Form faxed to her then she will finalize the acceptance.  Provider called patient's son and also informed him of pending discharge back to the facility.  Patient will be transferred back to the facility soon as she is ready.   ED Assessment Time Calculation: Start Time: 1108 Stop Time: 1137 Total Time in Minutes (Assessment Completion): 29   Past Psychiatric History: See initial Psychiatry evaluation note  Past Medical History:  Past Medical History:  Diagnosis Date   Alcohol related seizure (HCC)    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   Alcohol-induced persisting dementia (HCC)    Anemia    Anxiety    Closed head injury 2015; early 2017   "fell in my house"   Delirium, withdrawal, alcoholic (HCC) 2012   Depression     Gastrointestinal hemorrhage associated with gastric ulcer    H/O ETOH abuse    "has not had anything to 2 months" (10/2015)   History of blood transfusion 10/18/2015   "anemia"   History of esophagogastroduodenoscopy (EGD) 07/2013   erosive gastritis, no varices ([facility])   History of gastric ulcer    History of subdural hematoma 07/2017   right subdural hematoma extending between the leaves of tentorium on the right side.   Hypertension    per Gerrit Heck at [facility]   Hypoglycemia    Hypothyroidism    Memory difficulty 11/19/2015   Memory loss    Seasonal allergies    Seizures (HCC) 11/19/2015   Stroke [facility]) 2007   "dr's weren't sure if it was a stroke or seizure" (10/19/2015)   Subdural hematoma (HCC)    Syncope and collapse    Ventral hernia     Past Surgical History:  Procedure Laterality Date   CHOLECYSTECTOMY OPEN     ESOPHAGOGASTRODUODENOSCOPY N/A 10/19/2015   Procedure: ESOPHAGOGASTRODUODENOSCOPY (EGD);  Surgeon: Ruffin Frederick, MD;  Location: [facility] ENDOSCOPY;  Service: Gastroenterology;  Laterality: N/A;   TONSILLECTOMY     VENTRAL HERNIA REPAIR N/A 01/30/2019   Procedure: LAPAROSCOPIC INCARCERATED INCISIONAL HERNIA  REPAIR WITH MESH;  Surgeon: Karie Soda, MD;  Location: [facility] ORS;  Service: General;  Laterality: N/A;   Family History:  Family History  Problem Relation Age of Onset   Other Mother        Hypoglycemia   Cancer Father    Cancer Paternal Grandfather  Seizures Neg Hx    Family Psychiatric  History: See initial Psychiatry evaluation note Social History:  Social History   Substance and Sexual Activity  Alcohol Use No   Alcohol/week: 0.0 standard drinks of alcohol   Comment: Previous alcohol use, now none     Social History   Substance and Sexual Activity  Drug Use No    Social History   Socioeconomic History   Marital status: Widowed    Spouse name: Not on file   Number of children: 2   Years of education: Doctorate    Highest education level: Not on file  Occupational History   Occupation: N/A  Tobacco Use   Smoking status: Never   Smokeless tobacco: Never  Vaping Use   Vaping status: Never Used  Substance and Sexual Activity   Alcohol use: No    Alcohol/week: 0.0 standard drinks of alcohol    Comment: Previous alcohol use, now none   Drug use: No   Sexual activity: Not Currently  Other Topics Concern   Not on file  Social History Narrative   Currently staying w/ her son    Right-handed   Drinks between 1 and 6 cups of black coffee or sodas per day   Social Determinants of Health   Financial Resource Strain: Not on file  Food Insecurity: Not on file  Transportation Needs: Not on file  Physical Activity: Not on file  Stress: Not on file  Social Connections: Not on file    Tobacco Cessation:  N/A, patient does not currently use tobacco products  Current Medications: Current Facility-Administered Medications  Medication Dose Route Frequency Provider Last Rate Last Admin   divalproex (DEPAKOTE SPRINKLE) capsule 125 mg  125 mg Oral TID Dahlia Byes C, NP   125 mg at 03/27/23 0825   OLANZapine zydis (ZYPREXA) disintegrating tablet 5 mg  5 mg Oral Q8H PRN Motley-Mangrum, Jadeka A, PMHNP   5 mg at 03/26/23 2040   And   ziprasidone (GEODON) injection 20 mg  20 mg Intramuscular PRN Motley-Mangrum, Jadeka A, PMHNP       QUEtiapine (SEROQUEL) tablet 12.5 mg  12.5 mg Oral Q12H PRN Earney Navy, NP       Current Outpatient Medications  Medication Sig Dispense Refill   acetaminophen (TYLENOL) 500 MG tablet Take 1,000 mg by mouth every 8 (eight) hours as needed for moderate pain (pain score 4-6) or headache.     dextromethorphan-guaiFENesin (ROBITUSSIN-DM) 10-100 MG/5ML liquid Take by mouth every 4 (four) hours as needed for cough.     diazepam (VALIUM) 5 MG tablet Take 5 mg by mouth 3 (three) times daily.     feeding supplement, ENSURE COMPLETE, (ENSURE COMPLETE) LIQD Take 237 mLs by  mouth 2 (two) times daily.     levothyroxine (SYNTHROID) 50 MCG tablet Take 50 mcg by mouth daily.     methimazole (TAPAZOLE) 5 MG tablet Take 5 mg by mouth 2 (two) times daily.     mirtazapine (REMERON) 7.5 MG tablet Take 7.5 mg by mouth at bedtime.     simvastatin (ZOCOR) 40 MG tablet Take 40 mg by mouth daily.     venlafaxine (EFFEXOR) 75 MG tablet Take 75 mg by mouth in the morning.     venlafaxine XR (EFFEXOR-XR) 150 MG 24 hr capsule Take 150 mg by mouth in the morning.     divalproex (DEPAKOTE SPRINKLE) 125 MG capsule Take 1 capsule (125 mg total) by mouth 3 (three) times daily. 90 capsule 0  QUEtiapine (SEROQUEL) 25 MG tablet Take 0.5 tablets (12.5 mg total) by mouth every 12 (twelve) hours as needed (severe agitation). 20 tablet 0   PTA Medications: (Not in a hospital admission)   [place] Scale:  Flowsheet Row ED from 03/25/2023 in [facility] Emergency Department at [facility] ED from 11/06/2021 in [facility] Emergency Department at [facility] ED from 10/21/2020 in [facility] Emergency Department at [facility]  C-SSRS RISK CATEGORY No Risk No Risk No Risk       Musculoskeletal: Strength & Muscle Tone: within normal limits Gait & Station: normal Patient leans: Front  Psychiatric Specialty Exam: Presentation  General Appearance:  Casual; Appropriate for Environment  Eye Contact: Good  Speech: Clear and Coherent  Speech Volume: Normal  Handedness: Right   Mood and Affect  Mood: Anxious; Irritable  Affect: Appropriate   Thought Process  Thought Processes: Coherent  Descriptions of Associations:Loose  Orientation:Partial  Thought Content:Scattered; Illogical  History of Schizophrenia/Schizoaffective disorder:No data recorded Duration of Psychotic Symptoms:No data recorded Hallucinations:Hallucinations: None  Ideas of Reference:None  Suicidal Thoughts:Suicidal Thoughts: No  Homicidal Thoughts:Homicidal Thoughts:  No   Sensorium  Memory: Immediate Poor; Recent Poor; Remote Poor  Judgment: Impaired  Insight: Lacking   Executive Functions  Concentration: Fair  Attention Span: Fair  Recall: Poor  Fund of Knowledge: Poor  Language: Fair   Psychomotor Activity  Psychomotor Activity: Psychomotor Activity: Normal   Assets  Assets: Social Support; Housing; Communication Skills   Sleep  Sleep: Sleep: Fair    Physical Exam: Physical Exam Vitals and nursing note reviewed.  Constitutional:      Appearance: Normal appearance.  HENT:     Nose: Nose normal.  Cardiovascular:     Rate and Rhythm: Normal rate and regular rhythm.  Pulmonary:     Effort: Pulmonary effort is normal.  Musculoskeletal:        General: Normal range of motion.  Skin:    General: Skin is dry.  Neurological:     Mental Status: She is alert and oriented to person, place, and time.  Psychiatric:        Attention and Perception: Attention and perception normal.        Mood and Affect: Mood normal.        Speech: Speech normal.        Behavior: Behavior normal. Behavior is cooperative.        Thought Content: Thought content normal.        Cognition and Memory: Cognition is impaired. Memory is impaired.    ROS-Unable to enggae secondary to Dementia Blood pressure (!) 135/90, pulse 86, temperature (!) 97.4 F (36.3 C), temperature source Oral, resp. rate 20, SpO2 100%. There is no height or weight on file to calculate BMI.   Demographic Factors:  Age 53 or older, Divorced or widowed, and Caucasian  Loss Factors: NA and Memory decline secondary to Dementia.  Historical Factors: NA  Risk Reduction Factors:   Positive social support and Live at Memory care facility  Continued Clinical Symptoms:  More than one psychiatric diagnosis  Cognitive Features That Contribute To Risk:  None    Suicide Risk:  Minimal: No identifiable suicidal ideation.  Patients presenting with no risk  factors but with morbid ruminations; may be classified as minimal risk based on the severity of the depressive symptoms   Follow-up Information     [facility]. Call in 1 day.   Why: To schedule an appointment for follow-up  Contact information: [address] [place] [address] [place] [address] [phone]                Plan Of Care/Follow-up recommendations:  Activity:  as tolerated Diet:  Regular  Medical Decision Making: Patient is seen this morning calm and cooperative.  She engaged in short simple conversations.  Since arrival to the ER she has not had any negative behavior as in Agitation or aggression.  Seroquel low dose is added to her list of Medications.  She was taking Depakote sprinkle before coming to the ER and that is renewed as well.  None of her facility medications are discontinued.  Every Medication addition was reviewed with her sons.  Patient is Psychiatrically cleared. Disposition: Psychiatrically cleared.  Earney Navy, NP-PMHNP-BC 03/27/2023, 11:37 AM

## 2023-03-27 NOTE — ED Provider Notes (Signed)
Emergency Medicine Observation Re-evaluation Note  Jillian Harmon is a 72 y.o. female, seen on rounds today.  Pt initially presented to the ED for complaints of Aggressive Behavior Currently, the patient is being observed with concern for increased agitation, combativeness in setting of dementia.  Physical Exam  BP (!) 135/90 (BP Location: Right Arm)   Pulse 86   Temp (!) 97.4 F (36.3 C) (Oral)   Resp 20   SpO2 100%  Physical Exam General: NAD Cardiac: RR Lungs: even unlabored Psych: NA  ED Course / MDM  EKG:EKG Interpretation Date/Time:  Sunday March 25 2023 13:43:43 EST Ventricular Rate:  76 PR Interval:  155 QRS Duration:  89 QT Interval:  376 QTC Calculation: 423 R Axis:   51  Text Interpretation: Sinus rhythm Abnormal R-wave progression, early transition Confirmed by Drema Pry [phone]) on 03/26/2023 11:23:54 PM  I have reviewed the labs performed to date as well as medications administered while in observation.  Recent changes in the last 24 hours include presented to ED, medically cleared.  Plan  Current plan is for psychiatry reevaluation.      Melene Plan, DO 03/27/23 [phone]

## 2023-03-27 NOTE — ED Notes (Signed)
Patient medication compliant this AM.

## 2023-03-27 NOTE — [place] FL2 (Addendum)
[place] MEDICAID FL2 LEVEL OF CARE FORM     IDENTIFICATION  Patient Name: Jillian Harmon Birthdate: 03-Apr-1951 Sex: female Admission Date (Current Location): 03/25/2023  [facility] and [place] Number:  Producer, television/film/video and Address:  [facility],  [address] [place], [place] [address]      Provider Number: [id number]  Attending Physician Name and Address:  System, Provider Not In  Relative Name and Phone Number:  Addilee, Heckard)  [phone]    Current Level of Care: Hospital Recommended Level of Care:  Assisted Living Facility, Memory Care Prior Approval Number:    Date Approved/Denied:   PASRR Number:    Discharge Plan: Other (Comment)- Assisted Living Facility, Memory Care    Current Diagnoses: Patient Active Problem List   Diagnosis Date Noted   Dementia with behavioral disturbance (HCC) 03/25/2023   MDD (major depressive disorder) 03/25/2023   Incarcerated incisional hernia s/p lap repair w mesh 01/30/2019 01/30/2019   Seizures (HCC) 11/19/2015   Memory difficulty 11/19/2015   Faintness    Gait instability    Acute blood loss anemia 10/18/2015   Alcohol dependence (HCC) 10/18/2015   Symptomatic anemia     Orientation RESPIRATION BLADDER Height & Weight     Self  Normal Incontinent Weight:   Height:     BEHAVIORAL SYMPTOMS/MOOD NEUROLOGICAL BOWEL NUTRITION STATUS      Incontinent Diet (Regular)  AMBULATORY STATUS COMMUNICATION OF NEEDS Skin   Independent Verbally Normal                       Personal Care Assistance Level of Assistance  Bathing, Feeding, Dressing Bathing Assistance: Limited assistance Feeding assistance: Limited assistance Dressing Assistance: Limited assistance     Functional Limitations Info  Sight, Hearing, Speech Sight Info: Adequate Hearing Info: Adequate Speech Info: Adequate    SPECIAL CARE FACTORS FREQUENCY                       Contractures Contractures Info: Not present     Additional Factors Info  Code Status, Allergies Code Status Info: Full Allergies Info: Sertraline           Current Medications (03/27/2023):  This is the current hospital active medication list Current Facility-Administered Medications  Medication Dose Route Frequency Provider Last Rate Last Admin   divalproex (DEPAKOTE SPRINKLE) capsule 125 mg  125 mg Oral TID Dahlia Byes C, NP   125 mg at 03/27/23 0825   OLANZapine zydis (ZYPREXA) disintegrating tablet 5 mg  5 mg Oral Q8H PRN Motley-Mangrum, Jadeka A, PMHNP   5 mg at 03/26/23 2040   And   ziprasidone (GEODON) injection 20 mg  20 mg Intramuscular PRN Motley-Mangrum, Jadeka A, PMHNP       QUEtiapine (SEROQUEL) tablet 12.5 mg  12.5 mg Oral Q12H PRN Earney Navy, NP       Current Outpatient Medications  Medication Sig Dispense Refill   acetaminophen (TYLENOL) 500 MG tablet Take 1,000 mg by mouth every 8 (eight) hours as needed for moderate pain (pain score 4-6) or headache.     dextromethorphan-guaiFENesin (ROBITUSSIN-DM) 10-100 MG/5ML liquid Take by mouth every 4 (four) hours as needed for cough.     diazepam (VALIUM) 5 MG tablet Take 5 mg by mouth 3 (three) times daily.     feeding supplement, ENSURE COMPLETE, (ENSURE COMPLETE) LIQD Take 237 mLs by mouth 2 (two) times daily.     levothyroxine (SYNTHROID) 50 MCG tablet  Take 50 mcg by mouth daily.     methimazole (TAPAZOLE) 5 MG tablet Take 5 mg by mouth 2 (two) times daily.     mirtazapine (REMERON) 7.5 MG tablet Take 7.5 mg by mouth at bedtime.     simvastatin (ZOCOR) 40 MG tablet Take 40 mg by mouth daily.     venlafaxine (EFFEXOR) 75 MG tablet Take 75 mg by mouth in the morning.     venlafaxine XR (EFFEXOR-XR) 150 MG 24 hr capsule Take 150 mg by mouth in the morning.     divalproex (DEPAKOTE SPRINKLE) 125 MG capsule Take 1 capsule (125 mg total) by mouth 3 (three) times daily. 90 capsule 0   QUEtiapine (SEROQUEL) 25 MG tablet Take 0.5 tablets (12.5 mg total) by  mouth every 12 (twelve) hours as needed (severe agitation). 20 tablet 0     Discharge Medications: Please see discharge summary for a list of discharge medications.  Relevant Imaging Results:  Relevant Lab Results:   Additional Information SSN: [id number]  Princella Ion, LCSW

## 2023-03-27 NOTE — Progress Notes (Addendum)
CSW attempted to contact Jillian Harmon twice without success. Will send fax and attempt to obtain report number.   Addend @ 1:20 PM CSW spoke with Jillian Harmon who provided room and report info. Info given to RN via secure chat. CSW to fax FL2 and AVS. Son, [name], to transport back to [place]. Will pick up at 2:30PM.   Son reports he is POA, NOT Legal Guardian. CSW removed LG flag in chart. POA is reflected in emergency contact.

## 2023-03-27 NOTE — ED Notes (Signed)
Patient discharged off  unit to facility per provider. Patient alert, confused, cooperative, no s/s of distress at this time. Patient discharge information and belongings given to family member for facility. Patient ambulatory. Patient off unit in w//c, escorted by NT. Patient transported by family member.

## 2023-04-16 ENCOUNTER — Emergency Department (HOSPITAL_COMMUNITY): Payer: Medicare Other

## 2023-04-16 ENCOUNTER — Inpatient Hospital Stay (HOSPITAL_COMMUNITY)
Admission: EM | Admit: 2023-04-16 | Discharge: 2023-04-20 | DRG: 872 | Disposition: A | Discharge status: Skilled Nursing Facility | Payer: Medicare Other | Source: Skilled Nursing Facility | Attending: Family Medicine | Admitting: Family Medicine

## 2023-04-16 ENCOUNTER — Inpatient Hospital Stay (HOSPITAL_COMMUNITY): Payer: Medicare Other

## 2023-04-16 ENCOUNTER — Other Ambulatory Visit: Payer: Self-pay

## 2023-04-16 DIAGNOSIS — N3001 Acute cystitis with hematuria: Secondary | ICD-10-CM | POA: Diagnosis present

## 2023-04-16 DIAGNOSIS — A419 Sepsis, unspecified organism: Principal | ICD-10-CM | POA: Diagnosis present

## 2023-04-16 DIAGNOSIS — E079 Disorder of thyroid, unspecified: Secondary | ICD-10-CM | POA: Diagnosis present

## 2023-04-16 DIAGNOSIS — Z888 Allergy status to other drugs, medicaments and biological substances status: Secondary | ICD-10-CM

## 2023-04-16 DIAGNOSIS — Z8673 Personal history of transient ischemic attack (TIA), and cerebral infarction without residual deficits: Secondary | ICD-10-CM

## 2023-04-16 DIAGNOSIS — N390 Urinary tract infection, site not specified: Secondary | ICD-10-CM

## 2023-04-16 DIAGNOSIS — Z1612 Extended spectrum beta lactamase (ESBL) resistance: Secondary | ICD-10-CM | POA: Diagnosis present

## 2023-04-16 DIAGNOSIS — D649 Anemia, unspecified: Secondary | ICD-10-CM

## 2023-04-16 DIAGNOSIS — E876 Hypokalemia: Secondary | ICD-10-CM | POA: Diagnosis present

## 2023-04-16 DIAGNOSIS — N179 Acute kidney failure, unspecified: Secondary | ICD-10-CM | POA: Diagnosis present

## 2023-04-16 DIAGNOSIS — I679 Cerebrovascular disease, unspecified: Secondary | ICD-10-CM | POA: Insufficient documentation

## 2023-04-16 DIAGNOSIS — F419 Anxiety disorder, unspecified: Secondary | ICD-10-CM | POA: Diagnosis present

## 2023-04-16 DIAGNOSIS — F02818 Dementia in other diseases classified elsewhere, unspecified severity, with other behavioral disturbance: Secondary | ICD-10-CM | POA: Diagnosis present

## 2023-04-16 DIAGNOSIS — Z79899 Other long term (current) drug therapy: Secondary | ICD-10-CM

## 2023-04-16 DIAGNOSIS — F03918 Unspecified dementia, unspecified severity, with other behavioral disturbance: Secondary | ICD-10-CM | POA: Diagnosis present

## 2023-04-16 DIAGNOSIS — G309 Alzheimer's disease, unspecified: Secondary | ICD-10-CM | POA: Diagnosis present

## 2023-04-16 DIAGNOSIS — Z8711 Personal history of peptic ulcer disease: Secondary | ICD-10-CM | POA: Diagnosis not present

## 2023-04-16 DIAGNOSIS — G40909 Epilepsy, unspecified, not intractable, without status epilepticus: Secondary | ICD-10-CM | POA: Diagnosis present

## 2023-04-16 DIAGNOSIS — R569 Unspecified convulsions: Secondary | ICD-10-CM | POA: Diagnosis not present

## 2023-04-16 DIAGNOSIS — I10 Essential (primary) hypertension: Secondary | ICD-10-CM | POA: Diagnosis present

## 2023-04-16 DIAGNOSIS — A4151 Sepsis due to Escherichia coli [E. coli]: Principal | ICD-10-CM | POA: Diagnosis present

## 2023-04-16 DIAGNOSIS — Z79890 Hormone replacement therapy (postmenopausal): Secondary | ICD-10-CM | POA: Diagnosis not present

## 2023-04-16 DIAGNOSIS — E0590 Thyrotoxicosis, unspecified without thyrotoxic crisis or storm: Secondary | ICD-10-CM | POA: Diagnosis present

## 2023-04-16 DIAGNOSIS — E039 Hypothyroidism, unspecified: Secondary | ICD-10-CM | POA: Diagnosis present

## 2023-04-16 DIAGNOSIS — R739 Hyperglycemia, unspecified: Secondary | ICD-10-CM

## 2023-04-16 LAB — URINALYSIS, W/ REFLEX TO CULTURE (INFECTION SUSPECTED)
Bilirubin Urine: NEGATIVE
Glucose, UA: NEGATIVE mg/dL
Ketones, ur: NEGATIVE mg/dL
Nitrite: NEGATIVE
Protein, ur: 100 mg/dL — AB
Specific Gravity, Urine: 1.025 (ref 1.005–1.030)
Squamous Epithelial / HPF: NONE SEEN /[HPF] (ref 0–5)
pH: 6 (ref 5.0–8.0)

## 2023-04-16 LAB — BLOOD CULTURE ID PANEL (REFLEXED) - BCID2

## 2023-04-16 LAB — CBC WITH DIFFERENTIAL/PLATELET
Abs Immature Granulocytes: 0.07 10*3/uL (ref 0.00–0.07)
Basophils Absolute: 0.1 10*3/uL (ref 0.0–0.1)
Basophils Relative: 0 %
Eosinophils Absolute: 0.1 10*3/uL (ref 0.0–0.5)
Eosinophils Relative: 0 %
HCT: 36.5 % (ref 36.0–46.0)
Hemoglobin: 11.8 g/dL — ABNORMAL LOW (ref 12.0–15.0)
Immature Granulocytes: 1 %
Lymphocytes Relative: 7 %
Lymphs Abs: 1 10*3/uL (ref 0.7–4.0)
MCH: 30.6 pg (ref 26.0–34.0)
MCHC: 32.3 g/dL (ref 30.0–36.0)
MCV: 94.8 fL (ref 80.0–100.0)
Monocytes Absolute: 1.7 10*3/uL — ABNORMAL HIGH (ref 0.1–1.0)
Monocytes Relative: 12 %
Neutro Abs: 10.9 10*3/uL — ABNORMAL HIGH (ref 1.7–7.7)
Neutrophils Relative %: 80 %
Platelets: 221 10*3/uL (ref 150–400)
RBC: 3.85 MIL/uL — ABNORMAL LOW (ref 3.87–5.11)
RDW: 13.3 % (ref 11.5–15.5)
WBC: 13.7 10*3/uL — ABNORMAL HIGH (ref 4.0–10.5)
nRBC: 0 % (ref 0.0–0.2)

## 2023-04-16 LAB — COMPREHENSIVE METABOLIC PANEL
ALT: 30 U/L (ref 0–44)
AST: 37 U/L (ref 15–41)
Albumin: 3.2 g/dL — ABNORMAL LOW (ref 3.5–5.0)
Alkaline Phosphatase: 41 U/L (ref 38–126)
Anion gap: 11 (ref 5–15)
BUN: 20 mg/dL (ref 8–23)
CO2: 18 mmol/L — ABNORMAL LOW (ref 22–32)
Calcium: 8.3 mg/dL — ABNORMAL LOW (ref 8.9–10.3)
Chloride: 106 mmol/L (ref 98–111)
Creatinine, Ser: 1.23 mg/dL — ABNORMAL HIGH (ref 0.44–1.00)
GFR, Estimated: 47 mL/min — ABNORMAL LOW (ref >60)
Glucose, Bld: 142 mg/dL — ABNORMAL HIGH (ref 70–99)
Potassium: 3.6 mmol/L (ref 3.5–5.1)
Sodium: 135 mmol/L (ref 135–145)
Total Bilirubin: 0.7 mg/dL (ref <1.2)
Total Protein: 6.7 g/dL (ref 6.5–8.1)

## 2023-04-16 LAB — PROTIME-INR
INR: 1.3 — ABNORMAL HIGH (ref 0.8–1.2)
Prothrombin Time: 16.4 s — ABNORMAL HIGH (ref 11.4–15.2)

## 2023-04-16 LAB — I-STAT CG4 LACTIC ACID, ED: Lactic Acid, Venous: 1.2 mmol/L (ref 0.5–1.9)

## 2023-04-16 LAB — VALPROIC ACID LEVEL: Valproic Acid Lvl: 21 ug/mL — ABNORMAL LOW (ref 50.0–100.0)

## 2023-04-16 LAB — TSH: TSH: 1.316 u[IU]/mL (ref 0.350–4.500)

## 2023-04-16 LAB — C-REACTIVE PROTEIN: CRP: 14 mg/dL — ABNORMAL HIGH (ref <1.0)

## 2023-04-16 LAB — APTT: aPTT: 32 s (ref 24–36)

## 2023-04-16 LAB — PROCALCITONIN: Procalcitonin: 1.71 ng/mL

## 2023-04-16 LAB — HEMOGLOBIN A1C
Hgb A1c MFr Bld: 5.4 % (ref 4.8–5.6)
Mean Plasma Glucose: 108.28 mg/dL

## 2023-04-16 LAB — T4, FREE: Free T4: 0.86 ng/dL (ref 0.61–1.12)

## 2023-04-16 MED ORDER — QUETIAPINE FUMARATE 25 MG PO TABS
12.5000 mg | ORAL_TABLET | Freq: Two times a day (BID) | ORAL | Status: DC | PRN
  Administered 2023-04-20: 12.5 mg via ORAL
  Filled 2023-04-16: qty 1

## 2023-04-16 MED ORDER — SODIUM CHLORIDE 0.9 % IV SOLN: 2.0000 g | INTRAVENOUS | Status: DC

## 2023-04-16 MED ORDER — ALBUTEROL SULFATE (2.5 MG/3ML) 0.083% IN NEBU: 2.5000 mg | INHALATION_SOLUTION | RESPIRATORY_TRACT | Status: DC | PRN

## 2023-04-16 MED ORDER — HEPARIN SODIUM (PORCINE) 5000 UNIT/ML IJ SOLN
5000.0000 [IU] | Freq: Three times a day (TID) | INTRAMUSCULAR | Status: DC
  Administered 2023-04-16 – 2023-04-20 (×13): 5000 [IU] via SUBCUTANEOUS
  Filled 2023-04-16 (×13): qty 1

## 2023-04-16 MED ORDER — MIRTAZAPINE 15 MG PO TABS
7.5000 mg | ORAL_TABLET | Freq: Every day | ORAL | Status: DC
  Administered 2023-04-16 – 2023-04-19 (×4): 7.5 mg via ORAL
  Filled 2023-04-16 (×4): qty 1

## 2023-04-16 MED ORDER — DIAZEPAM 5 MG PO TABS
5.0000 mg | ORAL_TABLET | Freq: Three times a day (TID) | ORAL | Status: DC
  Administered 2023-04-16 – 2023-04-20 (×13): 5 mg via ORAL
  Filled 2023-04-16 (×13): qty 1

## 2023-04-16 MED ORDER — SODIUM CHLORIDE 0.9 % IV SOLN
1.0000 g | Freq: Two times a day (BID) | INTRAVENOUS | Status: DC
  Administered 2023-04-16 – 2023-04-19 (×6): 1 g via INTRAVENOUS
  Filled 2023-04-16 (×8): qty 20

## 2023-04-16 MED ORDER — DIVALPROEX SODIUM 125 MG PO CSDR
125.0000 mg | DELAYED_RELEASE_CAPSULE | Freq: Three times a day (TID) | ORAL | Status: DC
  Administered 2023-04-16 – 2023-04-20 (×13): 125 mg via ORAL
  Filled 2023-04-16 (×14): qty 1

## 2023-04-16 MED ORDER — SODIUM CHLORIDE 0.9 % IV SOLN
2.0000 g | Freq: Once | INTRAVENOUS | Status: AC
  Administered 2023-04-16: 2 g via INTRAVENOUS
  Filled 2023-04-16: qty 20

## 2023-04-16 MED ORDER — VENLAFAXINE HCL ER 150 MG PO CP24
150.0000 mg | ORAL_CAPSULE | Freq: Every morning | ORAL | Status: DC
  Administered 2023-04-17 – 2023-04-20 (×4): 150 mg via ORAL
  Filled 2023-04-16 (×4): qty 1

## 2023-04-16 MED ORDER — ACETAMINOPHEN 500 MG PO TABS
1000.0000 mg | ORAL_TABLET | Freq: Three times a day (TID) | ORAL | Status: DC | PRN
  Administered 2023-04-17 – 2023-04-18 (×2): 1000 mg via ORAL
  Filled 2023-04-16 (×4): qty 2

## 2023-04-16 MED ORDER — VENLAFAXINE HCL 75 MG PO TABS
75.0000 mg | ORAL_TABLET | Freq: Every morning | ORAL | Status: DC
  Administered 2023-04-17 – 2023-04-20 (×4): 75 mg via ORAL
  Filled 2023-04-16 (×4): qty 1

## 2023-04-16 MED ORDER — QUETIAPINE FUMARATE 25 MG PO TABS
12.5000 mg | ORAL_TABLET | Freq: Two times a day (BID) | ORAL | Status: DC
  Administered 2023-04-16 – 2023-04-20 (×9): 12.5 mg via ORAL
  Filled 2023-04-16 (×9): qty 1

## 2023-04-16 MED ORDER — SODIUM CHLORIDE 0.9 % IV SOLN: INTRAVENOUS | Status: AC

## 2023-04-16 MED ORDER — SIMVASTATIN 20 MG PO TABS
40.0000 mg | ORAL_TABLET | Freq: Every day | ORAL | Status: DC
  Administered 2023-04-16 – 2023-04-20 (×5): 40 mg via ORAL
  Filled 2023-04-16 (×5): qty 2

## 2023-04-16 NOTE — ED Notes (Signed)
Admitted patient to [facility]

## 2023-04-16 NOTE — ED Notes (Signed)
Pt argumentative and confused upon transport to floor, otherwise well appearing.

## 2023-04-16 NOTE — Progress Notes (Signed)
PHARMACY - PHYSICIAN COMMUNICATION CRITICAL VALUE ALERT - BLOOD CULTURE IDENTIFICATION (BCID)  Jillian Harmon is an 72 y.o. female who presented to [facility] on 04/16/2023 with a chief complaint of n/v/dysuria abdominal pain and fever  Assessment:  3 of 3 blood cultures growing ESBL E.coli, source possibly urinary  Name of physician (or Provider) Contacted: Dr. Janalyn Shy  Current antibiotics: ceftriaxone  Changes to prescribed antibiotics recommended:  Recommendations accepted by provider - change to meropenem 1g IV q12h  Results for orders placed or performed during the hospital encounter of 04/16/23  Blood Culture ID Panel (Reflexed) (Collected: 04/16/2023  5:55 AM)  Result Value Ref Range   Enterococcus faecalis NOT DETECTED NOT DETECTED   Enterococcus Faecium NOT DETECTED NOT DETECTED   Listeria monocytogenes NOT DETECTED NOT DETECTED   Staphylococcus species NOT DETECTED NOT DETECTED   Staphylococcus aureus (BCID) NOT DETECTED NOT DETECTED   Staphylococcus epidermidis NOT DETECTED NOT DETECTED   Staphylococcus lugdunensis NOT DETECTED NOT DETECTED   Streptococcus species NOT DETECTED NOT DETECTED   Streptococcus agalactiae NOT DETECTED NOT DETECTED   Streptococcus pneumoniae NOT DETECTED NOT DETECTED   Streptococcus pyogenes NOT DETECTED NOT DETECTED   A.calcoaceticus-baumannii NOT DETECTED NOT DETECTED   Bacteroides fragilis NOT DETECTED NOT DETECTED   Enterobacterales DETECTED (A) NOT DETECTED   Enterobacter cloacae complex NOT DETECTED NOT DETECTED   Escherichia coli DETECTED (A) NOT DETECTED   Klebsiella aerogenes NOT DETECTED NOT DETECTED   Klebsiella oxytoca NOT DETECTED NOT DETECTED   Klebsiella pneumoniae NOT DETECTED NOT DETECTED   Proteus species NOT DETECTED NOT DETECTED   Salmonella species NOT DETECTED NOT DETECTED   Serratia marcescens NOT DETECTED NOT DETECTED   Haemophilus influenzae NOT DETECTED NOT DETECTED   Neisseria meningitidis NOT DETECTED  NOT DETECTED   Pseudomonas aeruginosa NOT DETECTED NOT DETECTED   Stenotrophomonas maltophilia NOT DETECTED NOT DETECTED   Candida albicans NOT DETECTED NOT DETECTED   Candida auris NOT DETECTED NOT DETECTED   Candida glabrata NOT DETECTED NOT DETECTED   Candida krusei NOT DETECTED NOT DETECTED   Candida parapsilosis NOT DETECTED NOT DETECTED   Candida tropicalis NOT DETECTED NOT DETECTED   Cryptococcus neoformans/gattii NOT DETECTED NOT DETECTED   CTX-M ESBL DETECTED (A) NOT DETECTED   Carbapenem resistance IMP NOT DETECTED NOT DETECTED   Carbapenem resistance KPC NOT DETECTED NOT DETECTED   Carbapenem resistance NDM NOT DETECTED NOT DETECTED   Carbapenem resist OXA 48 LIKE NOT DETECTED NOT DETECTED   Carbapenem resistance VIM NOT DETECTED NOT DETECTED    Loralee Pacas, PharmD, BCPS 04/16/2023  8:33 PM

## 2023-04-16 NOTE — ED Provider Notes (Signed)
[place] EMERGENCY DEPARTMENT AT [facility], [place] Provider Note   CSN: [id number] Arrival date & time: 04/16/23  0535     History  Chief Complaint  Patient presents with   painful urination    Jillian Harmon is a 72 y.o. female.  The history is provided by the patient and the nursing home. The history is limited by the condition of the patient (Dementia).  She has history of hypertension, seizures, stroke, dementia and was sent here from a skilled nursing facility where she was reported to have complaining of not feeling well today.  Patient actually states that she has not felt well for a week.  She endorses chills, sweats but denies fever.  There has been minimal cough.  She denies nausea or vomiting.  She does endorse dysuria and urinary urgency and frequency.  Skilled nursing facility does report episode of vomiting today and fever to 100.5.   Home Medications Prior to Admission medications   Medication Sig Start Date End Date Taking? Authorizing Provider  acetaminophen (TYLENOL) 500 MG tablet Take 1,000 mg by mouth every 8 (eight) hours as needed for moderate pain (pain score 4-6) or headache.    [provider]  dextromethorphan-guaiFENesin (ROBITUSSIN-DM) 10-100 MG/5ML liquid Take by mouth every 4 (four) hours as needed for cough.    [provider]  diazepam (VALIUM) 5 MG tablet Take 5 mg by mouth 3 (three) times daily.    [provider]  divalproex (DEPAKOTE SPRINKLE) 125 MG capsule Take 1 capsule (125 mg total) by mouth 3 (three) times daily. 03/27/23 04/26/23  Earney Navy, NP  feeding supplement, ENSURE COMPLETE, (ENSURE COMPLETE) LIQD Take 237 mLs by mouth 2 (two) times daily.    [provider]  levothyroxine (SYNTHROID) 50 MCG tablet Take 50 mcg by mouth daily.    [provider]  methimazole (TAPAZOLE) 5 MG tablet Take 5 mg by mouth 2 (two) times daily.    [provider]  mirtazapine  (REMERON) 7.5 MG tablet Take 7.5 mg by mouth at bedtime.    [provider]  QUEtiapine (SEROQUEL) 25 MG tablet Take 0.5 tablets (12.5 mg total) by mouth every 12 (twelve) hours as needed (severe agitation). 03/27/23 04/26/23  Earney Navy, NP  simvastatin (ZOCOR) 40 MG tablet Take 40 mg by mouth daily.    [provider]  venlafaxine (EFFEXOR) 75 MG tablet Take 75 mg by mouth in the morning.    [provider]  venlafaxine XR (EFFEXOR-XR) 150 MG 24 hr capsule Take 150 mg by mouth in the morning.    [provider]      Allergies    Sertraline    Review of Systems   Review of Systems  Unable to perform ROS: Dementia    Physical Exam Updated Vital Signs BP 110/67   Pulse 100   Temp (!) 102.5 F (39.2 C) (Axillary)   Resp 20   Ht 5\' 1"  (1.549 m)   Wt 60 kg   SpO2 95%   BMI 24.99 kg/m  Physical Exam Vitals and nursing note reviewed.   72 year old female, resting comfortably and in no acute distress. Vital signs are significant for elevated temperature. Oxygen saturation is 95%, which is normal.  She is nontoxic in appearance. Head is normocephalic and atraumatic. PERRLA, EOMI. Oropharynx is clear. Neck is nontender and supple without adenopathy or JVD. Back is nontender and there is no CVA tenderness. Lungs are clear without rales, wheezes,  or rhonchi. Chest is nontender. Heart has regular rate and rhythm with 2/6 systolic ejection murmur best heard at the upper left sternal border. Abdomen is soft, flat, nontender. Extremities have no cyanosis or edema, full range of motion is present. Skin is warm and dry without rash. Neurologic: Awake and alert, moves all extremities equally.  ED Results / Procedures / Treatments   Labs (all labs ordered are listed, but only abnormal results are displayed) Labs Reviewed  COMPREHENSIVE METABOLIC PANEL - Abnormal; Notable for the following components:      Result Value   CO2 18 (*)    Glucose,  Bld 142 (*)    Creatinine, Ser 1.23 (*)    Calcium 8.3 (*)    Albumin 3.2 (*)    GFR, Estimated 47 (*)    All other components within normal limits  CBC WITH DIFFERENTIAL/PLATELET - Abnormal; Notable for the following components:   WBC 13.7 (*)    RBC 3.85 (*)    Hemoglobin 11.8 (*)    Neutro Abs 10.9 (*)    Monocytes Absolute 1.7 (*)    All other components within normal limits  PROTIME-INR - Abnormal; Notable for the following components:   Prothrombin Time 16.4 (*)    INR 1.3 (*)    All other components within normal limits  URINALYSIS, W/ REFLEX TO CULTURE (INFECTION SUSPECTED) - Abnormal; Notable for the following components:   APPearance HAZY (*)    Hgb urine dipstick LARGE (*)    Protein, ur 100 (*)    Leukocytes,Ua MODERATE (*)    Bacteria, UA MANY (*)    All other components within normal limits  CULTURE, BLOOD (ROUTINE X 2)  CULTURE, BLOOD (ROUTINE X 2)  URINE CULTURE  APTT  I-STAT CG4 LACTIC ACID, ED    EKG EKG Interpretation Date/Time:  Monday April 16 2023 05:59:36 EST Ventricular Rate:  98 PR Interval:  102 QRS Duration:  77 QT Interval:  328 QTC Calculation: 419 R Axis:   48  Text Interpretation: Sinus tachycardia Atrial premature complexes Short PR interval Abnormal R-wave progression, early transition When compared with ECG of 03/25/2023, No significant change was found Confirmed by Dione Booze ([id number]) on 04/16/2023 6:12:26 AM  Radiology DG Chest Port 1 View  Result Date: 04/16/2023 CLINICAL DATA:  72 year old female with possible sepsis. EXAM: PORTABLE CHEST 1 VIEW COMPARISON:  Chest x-ray 03/25/2023. FINDINGS: Lung volumes are low. No consolidative airspace disease. No pleural effusions. No pneumothorax. No pulmonary nodule or mass noted. Pulmonary vasculature and the cardiomediastinal silhouette are within normal limits. IMPRESSION: 1. Low lung volumes without radiographic evidence of acute cardiopulmonary disease. Electronically Signed   By:  Trudie Reed M.D.   On: 04/16/2023 06:33    Procedures Procedures  Cardiac monitor shows normal sinus rhythm, per my interpretation.  Medications Ordered in ED Medications  cefTRIAXone (ROCEPHIN) 2 g in sodium chloride 0.9 % 100 mL IVPB (has no administration in time range)    ED Course/ Medical Decision Making/ A&P                                 Medical Decision Making Amount and/or Complexity of Data Reviewed Labs: ordered. Radiology: ordered. ECG/medicine tests: ordered.  Risk Decision regarding hospitalization.   Fever, rule out sepsis.  History suggest that most likely source is UTI, but will need chest x-ray to rule out pneumonia.  I have ordered the evolving sepsis pathway  tests.  I have reviewed her laboratory test, my interpretation is urinary tract infection (urinalysis with 21-50 WBCs and many bacteria), normal lactic acid level, elevated creatinine which is new compared with 03/25/2023, mild leukocytosis which is nonspecific, mild anemia, elevated random glucose.  I have ordered a dose of ceftriaxone for her urinary tract infection, she will need to be admitted.  I have paged the internal medicine teaching service to admit the patient.  CRITICAL CARE Performed by: Dione Booze Total critical care time: 40 minutes Critical care time was exclusive of separately billable procedures and treating other patients. Critical care was necessary to treat or prevent imminent or life-threatening deterioration. Critical care was time spent personally by me on the following activities: development of treatment plan with patient and/or surrogate as well as nursing, discussions with consultants, evaluation of patient's response to treatment, examination of patient, obtaining history from patient or surrogate, ordering and performing treatments and interventions, ordering and review of laboratory studies, ordering and review of radiographic studies, pulse oximetry and re-evaluation of  patient's condition.  Final Clinical Impression(s) / ED Diagnoses Final diagnoses:  Sepsis due to undetermined organism [facility])  Urinary tract infection without hematuria, site unspecified  Acute kidney injury (nontraumatic) (HCC)  Normochromic normocytic anemia  Elevated random blood glucose level    Rx / DC Orders ED Discharge Orders     None         Dione Booze, MD 04/16/23 [phone]

## 2023-04-16 NOTE — ED Triage Notes (Signed)
Pt BIBGEMS from [place] [facility], pt feeling unwell all day today per staff. Had an episode of N/V prior to EMS arrival. Fever 100.5 and 650 mg Tylenol given. Pt reports pan with urination and lower abdominal pain. Diminished breath sounds on 2 LPM [place] to bring oxygen to 98%.   HX Dementia  CBG 139 ETCO2 29 114/68 100 hr

## 2023-04-16 NOTE — H&P (Signed)
History and Physical    Jillian Harmon NWG:[id number] DOB: [birth year] DOA: 04/16/2023  PCP: Knox Royalty, MD  Patient coming from: [facility]  I have personally briefly reviewed patient's old medical records in [facility]  Chief Complaint:  n/v/dysuria , fever 100.5,  HPI: Jillian Harmon is a 72 y.o. 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 Patient states she has had dysuria and abdominal pain on and off x one month , however over the last 2 days she has note increase dysuria, blood in urine as well as increase lower abdominal pain and flank pain.  She also notes generally feeling unwell. She unable to say whether or not she has had fever or chills.  She however does endorse sore throat /neck pain.    ED Course:  In ED patient vitals  temp 102.5, hr 101 , rr 20(30)  sat 95% on 2L Labs wbc 13.7, hgb 11.8 (12.6), ptl 221 Inr 1.3 Na 135, K 3.6, CL 106, bicarb 18, cr 1.23 ( 0.73) EKG: NSR, PAC  Cxr: IMPRESSION: 1. Low lung volumes without radiographic evidence of acute cardiopulmonary disease.  UA:+bacteria,  wbc 21-50 Tx ctx 2gram Review of Systems: As per HPI otherwise 10 point review of systems negative.   Past Medical History:  Diagnosis Date   Alcohol related seizure (HCC)    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   Alcohol-induced persisting dementia (HCC)    Anemia    Anxiety    Closed head injury 2015; early 2017   "fell in my house"   Delirium, withdrawal, alcoholic (HCC) 2012   Depression    Gastrointestinal hemorrhage associated with gastric ulcer    H/O ETOH abuse    "has not had anything to 2 months" (10/2015)   History of blood transfusion 10/18/2015   "anemia"   History of  esophagogastroduodenoscopy (EGD) 07/2013   erosive gastritis, no varices ([facility])   History of gastric ulcer    History of subdural hematoma 07/2017   right subdural hematoma extending between the leaves of tentorium on the right side.   Hypertension    per Gerrit Heck at [facility]   Hypoglycemia    Hypothyroidism    Memory difficulty 11/19/2015   Memory loss    Seasonal allergies    Seizures (HCC) 11/19/2015   Stroke [facility]) 2007   "dr's weren't sure if it was a stroke or seizure" (10/19/2015)   Subdural hematoma (HCC)    Syncope and collapse    Ventral hernia     Past Surgical History:  Procedure Laterality Date   CHOLECYSTECTOMY OPEN     ESOPHAGOGASTRODUODENOSCOPY N/A 10/19/2015   Procedure: ESOPHAGOGASTRODUODENOSCOPY (EGD);  Surgeon: Ruffin Frederick, MD;  Location: [facility] ENDOSCOPY;  Service: Gastroenterology;  Laterality: N/A;   TONSILLECTOMY     VENTRAL HERNIA REPAIR N/A 01/30/2019   Procedure: LAPAROSCOPIC INCARCERATED INCISIONAL HERNIA  REPAIR WITH MESH;  Surgeon: Karie Soda, MD;  Location: [facility] ORS;  Service: General;  Laterality: N/A;     reports that she has never smoked. She has never used smokeless tobacco. She reports that she does not drink alcohol and does not use drugs.  Allergies  Allergen Reactions   Sertraline Other (See Comments)     Causes hallucinations/paranoid thoughts    Family History  Problem Relation  Age of Onset   Other Mother        Hypoglycemia   Cancer Father    Cancer Paternal Grandfather    Seizures Neg Hx     Prior to Admission medications   Medication Sig Start Date End Date Taking? Authorizing Provider  acetaminophen (TYLENOL) 500 MG tablet Take 1,000 mg by mouth every 8 (eight) hours as needed for moderate pain (pain score 4-6) or headache.    [provider]  dextromethorphan-guaiFENesin (ROBITUSSIN-DM) 10-100 MG/5ML liquid Take by mouth every 4 (four) hours as needed for cough.    [provider]   diazepam (VALIUM) 5 MG tablet Take 5 mg by mouth 3 (three) times daily.    [provider]  divalproex (DEPAKOTE SPRINKLE) 125 MG capsule Take 1 capsule (125 mg total) by mouth 3 (three) times daily. 03/27/23 04/26/23  Earney Navy, NP  feeding supplement, ENSURE COMPLETE, (ENSURE COMPLETE) LIQD Take 237 mLs by mouth 2 (two) times daily.    [provider]  levothyroxine (SYNTHROID) 50 MCG tablet Take 50 mcg by mouth daily.    [provider]  methimazole (TAPAZOLE) 5 MG tablet Take 5 mg by mouth 2 (two) times daily.    [provider]  mirtazapine (REMERON) 7.5 MG tablet Take 7.5 mg by mouth at bedtime.    [provider]  QUEtiapine (SEROQUEL) 25 MG tablet Take 0.5 tablets (12.5 mg total) by mouth every 12 (twelve) hours as needed (severe agitation). 03/27/23 04/26/23  Earney Navy, NP  simvastatin (ZOCOR) 40 MG tablet Take 40 mg by mouth daily.    [provider]  venlafaxine (EFFEXOR) 75 MG tablet Take 75 mg by mouth in the morning.    [provider]  venlafaxine XR (EFFEXOR-XR) 150 MG 24 hr capsule Take 150 mg by mouth in the morning.    [provider]    Physical Exam: Vitals:   04/16/23 0630 04/16/23 0645 04/16/23 0700 04/16/23 0730  BP: 103/67 97/64 109/70 107/64  Pulse: 93 94 90 88  Resp: (!) 32 (!) 28 (!) 22 (!) 24  Temp:      TempSrc:      SpO2: 92% 93% 94% 94%  Weight:      Height:        Constitutional: NAD, calm, comfortable Vitals:   04/16/23 0630 04/16/23 0645 04/16/23 0700 04/16/23 0730  BP: 103/67 97/64 109/70 107/64  Pulse: 93 94 90 88  Resp: (!) 32 (!) 28 (!) 22 (!) 24  Temp:      TempSrc:      SpO2: 92% 93% 94% 94%  Weight:      Height:       Eyes: PERRL, lids and conjunctivae normal ENMT: Mucous membranes are moist. Posterior pharynx clear of any exudate or lesions.Normal dentition.  Neck: normal, supple, no masses, no thyromegaly Respiratory: clear to auscultation  bilaterally, no wheezing, no crackles. Normal respiratory effort. No accessory muscle use.  Cardiovascular: Regular rate and rhythm, no murmurs / rubs / gallops. No extremity edema. 2+ pedal pulses. Abdomen: no tenderness, no masses palpated. No hepatosplenomegaly. Bowel sounds positive.  Musculoskeletal: no clubbing / cyanosis. No joint deformity upper and lower extremities. Good ROM, no contractures. Normal muscle tone.  Skin: no rashes, lesions, ulcers. No induration Neurologic: CN 2-12 grossly intact. Sensation intact, Strength 5/5 in all 4.  Psychiatric: Normal judgment and insight. Alert and oriented x 3. Normal mood.    Labs on Admission: I have personally reviewed  following labs and imaging studies  CBC: Recent Labs  Lab 04/16/23 0555  WBC 13.7*  NEUTROABS 10.9*  HGB 11.8*  HCT 36.5  MCV 94.8  PLT 221   Basic Metabolic Panel: Recent Labs  Lab 04/16/23 0555  NA 135  K 3.6  CL 106  CO2 18*  GLUCOSE 142*  BUN 20  CREATININE 1.23*  CALCIUM 8.3*   GFR: Estimated Creatinine Clearance: 34.9 mL/min (A) (by C-G formula based on SCr of 1.23 mg/dL (H)). Liver Function Tests: Recent Labs  Lab 04/16/23 0555  AST 37  ALT 30  ALKPHOS 41  BILITOT 0.7  PROT 6.7  ALBUMIN 3.2*   No results for input(s): "LIPASE", "AMYLASE" in the last 168 hours. No results for input(s): "AMMONIA" in the last 168 hours. Coagulation Profile: Recent Labs  Lab 04/16/23 0555  INR 1.3*   Cardiac Enzymes: No results for input(s): "CKTOTAL", "CKMB", "CKMBINDEX", "TROPONINI" in the last 168 hours. BNP (last 3 results) No results for input(s): "PROBNP" in the last 8760 hours. HbA1C: No results for input(s): "HGBA1C" in the last 72 hours. CBG: No results for input(s): "GLUCAP" in the last 168 hours. Lipid Profile: No results for input(s): "CHOL", "HDL", "LDLCALC", "TRIG", "CHOLHDL", "LDLDIRECT" in the last 72 hours. Thyroid Function Tests: No results for input(s): "TSH", "T4TOTAL",  "FREET4", "T3FREE", "THYROIDAB" in the last 72 hours. Anemia Panel: No results for input(s): "VITAMINB12", "FOLATE", "FERRITIN", "TIBC", "IRON", "RETICCTPCT" in the last 72 hours. Urine analysis:    Component Value Date/Time   COLORURINE YELLOW 04/16/2023 0700   APPEARANCEUR HAZY (A) 04/16/2023 0700   LABSPEC 1.025 04/16/2023 0700   PHURINE 6.0 04/16/2023 0700   GLUCOSEU NEGATIVE 04/16/2023 0700   HGBUR LARGE (A) 04/16/2023 0700   BILIRUBINUR NEGATIVE 04/16/2023 0700   KETONESUR NEGATIVE 04/16/2023 0700   PROTEINUR 100 (A) 04/16/2023 0700   NITRITE NEGATIVE 04/16/2023 0700   LEUKOCYTESUR MODERATE (A) 04/16/2023 0700    Radiological Exams on Admission: DG Chest Port 1 View  Result Date: 04/16/2023 CLINICAL DATA:  72 year old female with possible sepsis. EXAM: PORTABLE CHEST 1 VIEW COMPARISON:  Chest x-ray 03/25/2023. FINDINGS: Lung volumes are low. No consolidative airspace disease. No pleural effusions. No pneumothorax. No pulmonary nodule or mass noted. Pulmonary vasculature and the cardiomediastinal silhouette are within normal limits. IMPRESSION: 1. Low lung volumes without radiographic evidence of acute cardiopulmonary disease. Electronically Signed   By: Trudie Reed M.D.   On: 04/16/2023 06:33    EKG: Independently reviewed. See above  Assessment/Plan  UTI with sepsis -admit to med tele  - continue with ceftriaxone  -f/u on urine culture  - continue ivfs   AKI  -in setting of uti sepsis  -presumed due to volume  -however to be complete will f/u with renal u/s to r/o ascending infection  -hold all nephrotoxic medications  - continue with ivfs -f/u on labs  Hx of Dementia  -multifactorial EtOH related on basline alzhiemer's -hx of agitation  -continue on respiridone   Hx of  Seizure d/o  -followed by neurology  -continue on keppra   Hx of Thyroid disease -check tsh , t3/t4 -patient has both methimazole and synthroid on dispense list -per pharmacy on this  regimen since 11/2022  -will consult endocrine for further assistance   Hx of CVA -continue on secondary ppx   Hx of PUD - continue ppo   DVT prophylaxis: heparin Code Status: full Family Communication: none at bedside Disposition Plan: patient  expected to be admitted greater than 2  midnights  Consults called: n/a Admission status: progressive care   Lurline Del MD [facility]  If 7PM-7AM, please contact night-coverage [email or web address] Password [email or web address]  04/16/2023, 8:01 AM

## 2023-04-16 NOTE — Progress Notes (Addendum)
ESBL E. coli bacteremia: Acute cystitis with sepsis: Pharmacy reported that micro lab 3 out of 2 blood cultures growing ESBL E. coli. - Per pharmacy recommendation changing ceftriaxone to meropenem 1 g IV twice daily. -Pending blood cultures sensitivity panel. -Pending urine culture.  Per chart review patient has been admitted for sepsis in the setting of UTI and management of AKI.

## 2023-04-17 ENCOUNTER — Encounter (HOSPITAL_COMMUNITY): Payer: Self-pay | Admitting: Internal Medicine

## 2023-04-17 DIAGNOSIS — I10 Essential (primary) hypertension: Secondary | ICD-10-CM | POA: Insufficient documentation

## 2023-04-17 DIAGNOSIS — F03918 Unspecified dementia, unspecified severity, with other behavioral disturbance: Secondary | ICD-10-CM

## 2023-04-17 DIAGNOSIS — A419 Sepsis, unspecified organism: Secondary | ICD-10-CM | POA: Diagnosis not present

## 2023-04-17 DIAGNOSIS — I679 Cerebrovascular disease, unspecified: Secondary | ICD-10-CM | POA: Insufficient documentation

## 2023-04-17 DIAGNOSIS — R569 Unspecified convulsions: Secondary | ICD-10-CM

## 2023-04-17 DIAGNOSIS — E0590 Thyrotoxicosis, unspecified without thyrotoxic crisis or storm: Secondary | ICD-10-CM | POA: Insufficient documentation

## 2023-04-17 DIAGNOSIS — E876 Hypokalemia: Secondary | ICD-10-CM | POA: Insufficient documentation

## 2023-04-17 LAB — CBC
HCT: 35.5 % — ABNORMAL LOW (ref 36.0–46.0)
Hemoglobin: 11.7 g/dL — ABNORMAL LOW (ref 12.0–15.0)
MCH: 31 pg (ref 26.0–34.0)
MCHC: 33 g/dL (ref 30.0–36.0)
MCV: 93.9 fL (ref 80.0–100.0)
Platelets: 159 10*3/uL (ref 150–400)
RBC: 3.78 MIL/uL — ABNORMAL LOW (ref 3.87–5.11)
RDW: 13.5 % (ref 11.5–15.5)
WBC: 12.1 10*3/uL — ABNORMAL HIGH (ref 4.0–10.5)
nRBC: 0 % (ref 0.0–0.2)

## 2023-04-17 LAB — COMPREHENSIVE METABOLIC PANEL
ALT: 24 U/L (ref 0–44)
AST: 29 U/L (ref 15–41)
Albumin: 2.6 g/dL — ABNORMAL LOW (ref 3.5–5.0)
Alkaline Phosphatase: 40 U/L (ref 38–126)
Anion gap: 9 (ref 5–15)
BUN: 15 mg/dL (ref 8–23)
CO2: 20 mmol/L — ABNORMAL LOW (ref 22–32)
Calcium: 7.8 mg/dL — ABNORMAL LOW (ref 8.9–10.3)
Chloride: 108 mmol/L (ref 98–111)
Creatinine, Ser: 1.17 mg/dL — ABNORMAL HIGH (ref 0.44–1.00)
GFR, Estimated: 50 mL/min — ABNORMAL LOW (ref >60)
Glucose, Bld: 107 mg/dL — ABNORMAL HIGH (ref 70–99)
Potassium: 3.3 mmol/L — ABNORMAL LOW (ref 3.5–5.1)
Sodium: 137 mmol/L (ref 135–145)
Total Bilirubin: 0.6 mg/dL (ref <1.2)
Total Protein: 6.4 g/dL — ABNORMAL LOW (ref 6.5–8.1)

## 2023-04-17 LAB — T3: T3, Total: 68 ng/dL — ABNORMAL LOW (ref 71–180)

## 2023-04-17 LAB — GLUCOSE, CAPILLARY: Glucose-Capillary: 101 mg/dL — ABNORMAL HIGH (ref 70–99)

## 2023-04-17 LAB — MRSA NEXT GEN BY PCR, NASAL: MRSA by PCR Next Gen: NOT DETECTED

## 2023-04-17 MED ORDER — METHIMAZOLE 5 MG PO TABS
5.0000 mg | ORAL_TABLET | Freq: Two times a day (BID) | ORAL | Status: DC
  Administered 2023-04-17 – 2023-04-20 (×7): 5 mg via ORAL
  Filled 2023-04-17 (×8): qty 1

## 2023-04-17 MED ORDER — POTASSIUM CHLORIDE CRYS ER 20 MEQ PO TBCR
40.0000 meq | EXTENDED_RELEASE_TABLET | Freq: Once | ORAL | Status: AC
  Administered 2023-04-17: 40 meq via ORAL
  Filled 2023-04-17: qty 2

## 2023-04-17 NOTE — Assessment & Plan Note (Signed)
No seizures here - Continue Depakote, Valium

## 2023-04-17 NOTE — Assessment & Plan Note (Addendum)
TSH normal, free T4 normal, T3 slightly low.  Appears to be on LT4 supplement and methimazole.  I see no Endocrinology follow up notes since 2021, unsure if they are outside [facility] system.   - Stop LT4 - Continue methimazole

## 2023-04-17 NOTE — Assessment & Plan Note (Signed)
Resolved with alcohol cessation

## 2023-04-17 NOTE — Hospital Course (Signed)
72 y.o. F with dementia, lives in LTC, hx seizures, hx CVA, HTN, and PUD who presented with fever, dysuria and found to have UTI and ESBL E coli bacteremia.

## 2023-04-17 NOTE — Assessment & Plan Note (Signed)
At baseline the patient is oriented to self only, she appears to be that way now as well. - Continue venlafaxine, Seroquel, mirtazapine

## 2023-04-17 NOTE — Assessment & Plan Note (Signed)
Treated and resolved 

## 2023-04-17 NOTE — TOC Initial Note (Signed)
Transition of Care [facility]) - Initial/Assessment Note    Patient Details  Name: Jillian Harmon MRN: [id number] Date of Birth: 23-Dec-1950  [facility]) CM/SW Contact:    Darriona Dehaas A [place], Theresia Majors Phone Number: 04/17/2023, 10:29 AM  Clinical Narrative:                  CSW contacted pt's son to complete assessment at pt is not oriented. He stated that she is from [facility] and has been a resident for some time. He stated that he could provide transportation at discharge.   CSW contacted Olivia Mackie at [facility] and she confirmed pt is a resident and can return once medically stable.    TOC will continue to follow.   Expected Discharge Plan: Memory Care Barriers to Discharge: Continued Medical Work up   Patient Goals and CMS Choice            Expected Discharge Plan and Services       Living arrangements for the past 2 months: Assisted Living Facility                                      Prior Living Arrangements/Services Living arrangements for the past 2 months: Assisted Living Facility Lives with:: Facility Resident          Need for Family Participation in Patient Care: Yes (Comment) Care giver support system in place?: Yes (comment) (Son, Psychologist, prison and probation services)      Activities of Daily Living   ADL Screening (condition at time of admission) Independently performs ADLs?: Yes (appropriate for developmental age) Is the patient deaf or have difficulty hearing?: No Does the patient have difficulty seeing, even when wearing glasses/contacts?: No Does the patient have difficulty concentrating, remembering, or making decisions?: No  Permission Sought/Granted                  Emotional Assessment Appearance:: Appears stated age Attitude/Demeanor/Rapport: Unable to Assess Affect (typically observed): Unable to Assess Orientation: : Oriented to Self Alcohol / Substance Use: Not Applicable Psych Involvement: No (comment)  Admission  diagnosis:  Acute kidney injury (nontraumatic) (HCC) [N17.9] Sepsis secondary to UTI (HCC) [A41.9, N39.0] Normochromic normocytic anemia [D64.9] Elevated random blood glucose level [R73.9] Sepsis due to undetermined organism (HCC) [A41.9] Urinary tract infection without hematuria, site unspecified [N39.0] Patient Active Problem List   Diagnosis Date Noted   Sepsis secondary to UTI (HCC) 04/16/2023   Dementia with behavioral disturbance (HCC) 03/25/2023   MDD (major depressive disorder) 03/25/2023   Incarcerated incisional hernia s/p lap repair w mesh 01/30/2019 01/30/2019   Seizures (HCC) 11/19/2015   Memory difficulty 11/19/2015   Faintness    Gait instability    Acute blood loss anemia 10/18/2015   Alcohol dependence (HCC) 10/18/2015   Symptomatic anemia    PCP:  Knox Royalty, MD Pharmacy:   [id number] [place], [place] - [address] [place] [address] Phone: [phone] Fax: [phone]  [id number] [place], [place] - [address] [facility] AT [facility] [address] [place] [address] Phone: [phone] Fax: [phone]     Social Determinants of Health (SDOH) Social History: SDOH Screenings   Food Insecurity: No Food Insecurity (04/16/2023)  Housing: Low Risk  (04/16/2023)  Transportation Needs: No Transportation Needs (04/16/2023)  Utilities: Not At Risk (04/16/2023)  Tobacco Use: Low Risk  (03/25/2023)   SDOH Interventions:  Readmission Risk Interventions     No data to display

## 2023-04-17 NOTE — Assessment & Plan Note (Signed)
P/w tachycardia, fever, and AKI in setting of ESBL bacteremia due to UTI. - Continue meropenem -Follow cultures and sensitivities

## 2023-04-17 NOTE — Progress Notes (Signed)
  Progress Note   Patient: Jillian Harmon VWU:[id number] DOB: March 02, 1951 DOA: 04/16/2023     1 DOS: the patient was seen and examined on 04/17/2023 at 11:08 AM      Brief hospital course: 72 y.o. F with dementia, lives in LTC, hx seizures, hx CVA, HTN, and PUD who presented with fever, dysuria and found to have UTI and ESBL E coli bacteremia.     Assessment and Plan: * Sepsis secondary to ESBL E coli bacteremia due to UTI (HCC) P/w tachycardia, fever, and AKI in setting of ESBL bacteremia due to UTI. - Continue meropenem -Follow cultures and sensitivities    Hyperthyroidism TSH normal, free T4 normal, T3 slightly low.  Appears to be on LT4 supplement and methimazole.  I see no Endocrinology follow up notes since 2021, unsure if they are outside [facility] system.   - Stop LT4 - Continue methimazole  Essential hypertension Resolved with alcohol cessation  Cerebrovascular disease Not on aspirin due to peptic ulcer disease. - Continue simvastatin  Hypokalemia - Supplement potassium  Dementia with behavioral disturbance (HCC) At baseline the patient is oriented to self only, she appears to be that way now as well. - Continue venlafaxine, Seroquel, mirtazapine  Seizures (HCC) - Continue Depakote, Valium          Subjective: Patient has generalized fatigue, but no focal abdominal pain, flank pain, urinary complaints.  She is however an unreliable historian.  Nursing report no concerns.     Physical Exam: BP 128/67 (BP Location: Left Arm)   Pulse (!) 102   Temp 99.3 F (37.4 C) (Oral)   Resp 18   Ht 5\' 1"  (1.549 m)   Wt 66.4 kg   SpO2 91%   BMI 27.66 kg/m   Elderly adult female, lying in bed, makes eye contact, responds to questions Tachycardic, regular, no peripheral edema, no JVD Respiratory rate normal, lungs clear without rales or wheezes Abdomen without guarding, rigidity, or rebound, soft throughout Attentive to questions, but oriented  to self only.  Reports that she would like to go home because she missed her high school graduation.  Face metric, speech fluent, moves upper extremities and lower extremities with 4+/5 strength symmetrically.    Data Reviewed: Patient metabolic panel shows creatinine down to 1.1, potassium down to 3.3 CBC shows improving white count Procalcitonin 1.7 Blood cultures growing gram-negative rods, PCR shows ESBL E. coli INR 1.3 TSH normal Renal ultrasound normal    Family Communication: Son Ivin Booty by phone    Disposition: Status is: Inpatient Patient was admitted with sepsis due to E. coli ESBL bacteremia, due to UTI  If she continues to defervesce appropriately, and her culture is sensitive to oral agents, hopefully discharge to her assisted living facility tomorrow on orals        Author: Alberteen Sam, MD 04/17/2023 12:55 PM  For on call review [email or web address].

## 2023-04-17 NOTE — Assessment & Plan Note (Signed)
Not on aspirin due to peptic ulcer disease. - Continue simvastatin

## 2023-04-18 DIAGNOSIS — E0590 Thyrotoxicosis, unspecified without thyrotoxic crisis or storm: Secondary | ICD-10-CM

## 2023-04-18 DIAGNOSIS — F03918 Unspecified dementia, unspecified severity, with other behavioral disturbance: Secondary | ICD-10-CM | POA: Diagnosis not present

## 2023-04-18 DIAGNOSIS — I679 Cerebrovascular disease, unspecified: Secondary | ICD-10-CM | POA: Diagnosis not present

## 2023-04-18 DIAGNOSIS — A419 Sepsis, unspecified organism: Secondary | ICD-10-CM | POA: Diagnosis not present

## 2023-04-18 DIAGNOSIS — I10 Essential (primary) hypertension: Secondary | ICD-10-CM | POA: Diagnosis not present

## 2023-04-18 LAB — CBC
HCT: 41.7 % (ref 36.0–46.0)
Hemoglobin: 13.6 g/dL (ref 12.0–15.0)
MCH: 30.7 pg (ref 26.0–34.0)
MCHC: 32.6 g/dL (ref 30.0–36.0)
MCV: 94.1 fL (ref 80.0–100.0)
Platelets: 156 10*3/uL (ref 150–400)
RBC: 4.43 MIL/uL (ref 3.87–5.11)
RDW: 13.7 % (ref 11.5–15.5)
WBC: 7.6 10*3/uL (ref 4.0–10.5)
nRBC: 0 % (ref 0.0–0.2)

## 2023-04-18 LAB — URINE CULTURE: Culture: >=100000 — AB

## 2023-04-18 LAB — COMPREHENSIVE METABOLIC PANEL
ALT: 40 U/L (ref 0–44)
AST: 57 U/L — ABNORMAL HIGH (ref 15–41)
Albumin: 3 g/dL — ABNORMAL LOW (ref 3.5–5.0)
Alkaline Phosphatase: 46 U/L (ref 38–126)
Anion gap: 12 (ref 5–15)
BUN: 18 mg/dL (ref 8–23)
CO2: 19 mmol/L — ABNORMAL LOW (ref 22–32)
Calcium: 8.7 mg/dL — ABNORMAL LOW (ref 8.9–10.3)
Chloride: 104 mmol/L (ref 98–111)
Creatinine, Ser: 1.16 mg/dL — ABNORMAL HIGH (ref 0.44–1.00)
GFR, Estimated: 50 mL/min — ABNORMAL LOW (ref >60)
Glucose, Bld: 85 mg/dL (ref 70–99)
Potassium: 4.4 mmol/L (ref 3.5–5.1)
Sodium: 135 mmol/L (ref 135–145)
Total Bilirubin: 0.7 mg/dL (ref <1.2)
Total Protein: 7.3 g/dL (ref 6.5–8.1)

## 2023-04-18 LAB — CULTURE, BLOOD (ROUTINE X 2): Special Requests: ADEQUATE

## 2023-04-18 NOTE — Evaluation (Signed)
Physical Therapy Evaluation Patient Details Name: Jillian Harmon MRN: [id number] DOB: August 01, 1950 Today's Date: 04/18/2023  History of Present Illness  Pt is a 72 y/o female admitted 12/9 through the ED from LTC wth complaint of N/V/dysuria, abdominal pain and feveer of 100.5  Work up includes sepsis UTI.  PMHx; alcohol related seizure and dementia, CHI, withdrawal delirium, ETOH abuse, HTN, Stroke, SDH,  Clinical Impression  Pt admitted with/for the above complaints, but generally sepsis.  Pt is likely not baseline mobility, needing some strengthening overall and needing min to light moderate assist for basic mobility/gait.  Pt currently limited functionally due to the problems listed. ( See problems list.)   Pt will benefit from PT to maximize function and safety in order to get ready for next venue listed below.         If plan is discharge home, recommend the following: A little help with walking and/or transfers;A lot of help with bathing/dressing/bathroom;Assistance with cooking/housework;Direct supervision/assist for medications management;Direct supervision/assist for financial management   Can travel by private vehicle   No    Equipment Recommendations None recommended by PT (like all available from SNF)  Recommendations for Other Services       Functional Status Assessment Patient has had a recent decline in their functional status and demonstrates the ability to make significant improvements in function in a reasonable and predictable amount of time.     Precautions / Restrictions Precautions Precautions: Fall      Mobility  Bed Mobility Overal bed mobility: Needs Assistance Bed Mobility: Supine to Sit, Sit to Supine     Supine to sit: Min assist Sit to supine: Min assist   General bed mobility comments: pt sat up in the bed, but needed min/mod cues and min assist to help initiate legs moving  toward EOB as well as providing a little stability while  scooting..    Transfers Overall transfer level: Needs assistance Equipment used: None Transfers: Sit to/from Stand Sit to Stand: Min assist, Mod assist (dependent on the height of the surface.)           General transfer comment: cues for hand placement, assist forward more than boost.    Ambulation/Gait Ambulation/Gait assistance: Min assist Gait Distance (Feet): 15 Feet (to bathroom, then washing at sink before another 30 feet in hall and back to the bed. with HHA  and truncal stability assist.) Assistive device: 1 person hand held assist Gait Pattern/deviations: Step-through pattern   Gait velocity interpretation: <1.31 ft/sec, indicative of household ambulator   General Gait Details: pt unsteady overall, with short steppage gait and guarded at best..  Fall mats produce further instability during turns to back up to the bed.  Stairs            Wheelchair Mobility     Tilt Bed    Modified Rankin (Stroke Patients Only)       Balance Overall balance assessment: History of Falls, Needs assistance Sitting-balance support: No upper extremity supported, Single extremity supported, Feet supported Sitting balance-Leahy Scale: Fair     Standing balance support: Bilateral upper extremity supported, Single extremity supported, No upper extremity supported Standing balance-Leahy Scale: Fair Standing balance comment: pt needing 1 hand on stationary surface for safety/stability during washing her hands at the sink and with reaching for and drying hands at the sink.  Pertinent Vitals/Pain Pain Assessment Pain Assessment: Faces Faces Pain Scale: No hurt Pain Intervention(s): Monitored during session    Home Living Family/patient expects to be discharged to:: Skilled nursing facility                   Additional Comments: likely assisted with ADL's and HHA for mobility.    Prior Function Prior Level of Function : Needs  assist                     Extremity/Trunk Assessment   Upper Extremity Assessment Upper Extremity Assessment: Generalized weakness;Overall [facility] for tasks assessed    Lower Extremity Assessment Lower Extremity Assessment: Generalized weakness;Overall [facility] for tasks assessed    Cervical / Trunk Assessment Cervical / Trunk Assessment: Normal  Communication   Communication Communication: No apparent difficulties Cueing Techniques: Verbal cues  Cognition Arousal: Alert Behavior During Therapy: Flat affect Overall Cognitive Status: History of cognitive impairments - at baseline                                          General Comments General comments (skin integrity, edema, etc.): VSS on RA    Exercises     Assessment/Plan    PT Assessment Patient needs continued PT services  PT Problem List Decreased strength;Decreased activity tolerance;Decreased balance;Decreased coordination;Decreased cognition;Decreased knowledge of use of DME;Decreased knowledge of precautions       PT Treatment Interventions DME instruction;Gait training;Functional mobility training;Therapeutic activities;Balance training;Patient/family education    PT Goals (Current goals can be found in the Care Plan section)  Acute Rehab PT Goals Patient Stated Goal: pt didn't participate PT Goal Formulation: Patient unable to participate in goal setting Time For Goal Achievement: 05/02/23 Potential to Achieve Goals: Fair    Frequency Min 1X/week     Co-evaluation               AM-PAC PT "6 Clicks" Mobility  Outcome Measure Help needed turning from your back to your side while in a flat bed without using bedrails?: A Little Help needed moving from lying on your back to sitting on the side of a flat bed without using bedrails?: A Little Help needed moving to and from a bed to a chair (including a wheelchair)?: A Lot Help needed standing up from a chair using your arms (e.g.,  wheelchair or bedside chair)?: A Little Help needed to walk in hospital room?: A Little Help needed climbing 3-5 steps with a railing? : A Little 6 Click Score: 17    End of Session   Activity Tolerance: Patient tolerated treatment well;Patient limited by fatigue Patient left: in bed;with call bell/phone within reach;with bed alarm set;with chair alarm set Nurse Communication: Mobility status PT Visit Diagnosis: Unsteadiness on feet (R26.81);Other abnormalities of gait and mobility (R26.89);Difficulty in walking, not elsewhere classified (R26.2);Ataxic gait (R26.0);Muscle weakness (generalized) (M62.81)    Time: 1610-9604 PT Time Calculation (min) (ACUTE ONLY): 18 min   Charges:   PT Evaluation $PT Eval Moderate Complexity: 1 Mod   PT General Charges $$ ACUTE PT VISIT: 1 Visit         04/18/2023  Jacinto Halim., PT Acute Rehabilitation Services [phone]  (office)  Eliseo Gum Rosy Estabrook 04/18/2023, 4:32 PM

## 2023-04-18 NOTE — Evaluation (Signed)
Occupational Therapy Evaluation Patient Details Name: Jillian Harmon MRN: [id number] DOB: 1951-02-07 Today's Date: 04/18/2023   History of Present Illness Pt is a 72 y/o female admitted 12/9 through the ED from LTC wth complaint of N/V/dysuria, abdominal pain and feveer of 100.5  Work up includes sepsis UTI.  PMHx; alcohol related seizure and dementia, CHI, withdrawal delirium, ETOH abuse, HTN, Stroke, SDH,   Clinical Impression   PTA, pt lived at [facility] and likely received assist with ADL, however, pt poor historian and unable to report. Pt needing grossly min A for balance EOB and OOB this session during static and dynamic mobility. Pt needing cues for sequencing intermittently. Will continue to follow acutely and Patient will benefit from continued inpatient follow up therapy, <3 hours/day       If plan is discharge home, recommend the following: A little help with walking and/or transfers;A little help with bathing/dressing/bathroom;A lot of help with bathing/dressing/bathroom;Assistance with cooking/housework;Direct supervision/assist for medications management;Direct supervision/assist for financial management;Assist for transportation;Help with stairs or ramp for entrance    Functional Status Assessment  Patient has had a recent decline in their functional status and demonstrates the ability to make significant improvements in function in a reasonable and predictable amount of time.  Equipment Recommendations  None recommended by OT    Recommendations for Other Services       Precautions / Restrictions Precautions Precautions: Fall Restrictions Weight Bearing Restrictions: No      Mobility Bed Mobility Overal bed mobility: Needs Assistance Bed Mobility: Supine to Sit, Sit to Supine     Supine to sit: Min assist Sit to supine: Min assist   General bed mobility comments: pt sat up in the bed, but needed min/mod cues and min assist to help initiate  legs moving  toward EOB as well as providing a little stability while scooting..    Transfers Overall transfer level: Needs assistance Equipment used: None Transfers: Sit to/from Stand Sit to Stand: Min assist, Mod assist (dependent on the height of the surface.)           General transfer comment: cues for hand placement, assist forward more than boost.      Balance Overall balance assessment: History of Falls, Needs assistance Sitting-balance support: No upper extremity supported, Single extremity supported, Feet supported Sitting balance-Leahy Scale: Poor Sitting balance - Comments: min A for sitting balance EOB with feet on floor   Standing balance support: Bilateral upper extremity supported, Single extremity supported, No upper extremity supported Standing balance-Leahy Scale: Poor Standing balance comment: min external support during bilateral UE tasks in static standing                           ADL either performed or assessed with clinical judgement   ADL Overall ADL's : Needs assistance/impaired Eating/Feeding: Set up;Bed level   Grooming: Minimal assistance;Oral care;Standing Grooming Details (indicate cue type and reason): Min A for balance and 1 cue for sequence Upper Body Bathing: Minimal assistance;Sitting   Lower Body Bathing: Moderate assistance;Sit to/from stand   Upper Body Dressing : Minimal assistance;Sitting   Lower Body Dressing: Moderate assistance;Sit to/from stand   Toilet Transfer: Minimal assistance;Ambulation Toilet Transfer Details (indicate cue type and reason): 1 person HHA         Functional mobility during ADLs: Minimal assistance General ADL Comments: 1 person HHA     Vision Baseline Vision/History: 1 Wears glasses (per pt report) Patient Visual Report: No  change from baseline Additional Comments: unable to read signage in room from bed     Perception Perception: Not tested       Praxis Praxis: Not tested        Pertinent Vitals/Pain Pain Assessment Pain Assessment: Faces Faces Pain Scale: Hurts a little bit Pain Location: stomach Pain Descriptors / Indicators: Aching, Guarding Pain Intervention(s): Limited activity within patient's tolerance, Monitored during session     Extremity/Trunk Assessment Upper Extremity Assessment Upper Extremity Assessment: Generalized weakness   Lower Extremity Assessment Lower Extremity Assessment: Defer to PT evaluation   Cervical / Trunk Assessment Cervical / Trunk Assessment: Normal   Communication Communication Communication: No apparent difficulties Cueing Techniques: Verbal cues   Cognition Arousal: Alert Behavior During Therapy: Flat affect Overall Cognitive Status: History of cognitive impairments - at baseline                                 General Comments: Pt following basic commands, oriented to self; aware she is not at "home". believes she lives in a town home with her husband and children but per chart, from [facility].     General Comments  VSS on RA. HR in 80s    Exercises     Shoulder Instructions      Home Living Family/patient expects to be discharged to:: Skilled nursing facility [facility])                                 Additional Comments: likely assisted with ADL's and HHA for mobility.      Prior Functioning/Environment Prior Level of Function : Needs assist                        OT Problem List: Decreased strength;Decreased activity tolerance;Impaired balance (sitting and/or standing);Decreased cognition;Decreased safety awareness;Decreased knowledge of use of DME or AE      OT Treatment/Interventions: Self-care/ADL training;Therapeutic exercise;DME and/or AE instruction;Balance training;Patient/family education;Therapeutic activities;Cognitive remediation/compensation    OT Goals(Current goals can be found in the care plan section) Acute Rehab OT  Goals Patient Stated Goal: go home OT Goal Formulation: With patient Time For Goal Achievement: 05/02/23 Potential to Achieve Goals: Good  OT Frequency: Min 1X/week    Co-evaluation              AM-PAC OT "6 Clicks" Daily Activity     Outcome Measure Help from another person eating meals?: A Little Help from another person taking care of personal grooming?: A Little Help from another person toileting, which includes using toliet, bedpan, or urinal?: A Lot Help from another person bathing (including washing, rinsing, drying)?: A Lot Help from another person to put on and taking off regular upper body clothing?: A Little Help from another person to put on and taking off regular lower body clothing?: A Lot 6 Click Score: 15   End of Session Equipment Utilized During Treatment: Gait belt Nurse Communication: Mobility status  Activity Tolerance: Patient tolerated treatment well Patient left: in bed;with call bell/phone within reach;with bed alarm set  OT Visit Diagnosis: Unsteadiness on feet (R26.81);Muscle weakness (generalized) (M62.81);Other symptoms and signs involving cognitive function                Time: 1646-1700 OT Time Calculation (min): 14 min Charges:  OT General Charges $OT Visit: 1 Visit OT Evaluation $  OT Eval Moderate Complexity: 1 Mod  Jillian Harmon, OTR/L [facility] Acute Rehabilitation Office: [phone]   Jillian Harmon 04/18/2023, 5:24 PM

## 2023-04-18 NOTE — Progress Notes (Signed)
  Progress Note   Patient: Jillian Harmon UJW:[id number] DOB: 1950/07/06 DOA: 04/16/2023     2 DOS: the patient was seen and examined on 04/18/2023 at 12:23 PM      Brief hospital course: 72 y.o. F with dementia, lives in LTC, hx seizures, hx CVA, HTN, and PUD who presented with fever, dysuria and found to have UTI and ESBL E coli bacteremia.     Assessment and Plan: * Sepsis secondary to ESBL E coli bacteremia due to UTI (HCC) P/w tachycardia, fever, and AKI in setting of ESBL bacteremia due to UTI.  Sensitivities returned, this ESBL has no oral options - Continue meropenem     Hyperthyroidism TSH normal, free T4 normal, T3 slightly low.  Appears to be on LT4 supplement and methimazole.  I see no Endocrinology follow up notes since 2021, unsure if they are outside [facility].   - Stop LT4 - Continue methimazole  Essential hypertension Resolved with alcohol cessation  Cerebrovascular disease Not on aspirin due to peptic ulcer disease. - Continue simvastatin  Hypokalemia Supplemented and resolved  Dementia with behavioral disturbance (HCC) At baseline the patient is oriented to self only, she appears to be that way now as well. - Continue venlafaxine, Seroquel, mirtazapine  Seizures (HCC) No seizures here - Continue Depakote, Valium          Subjective: Patient still feels tired, generalized malaise, weak.  Had a fever to 103 yesterday afternoon again.  No confusion, no respiratory symptoms.  She reports some left-sided abdominal pain.     Physical Exam: BP 119/72 (BP Location: Left Arm)   Pulse 80   Temp 98.9 F (37.2 C) (Oral)   Resp 18   Ht 5\' 1"  (1.549 m)   Wt 67.3 kg   SpO2 97%   BMI 28.03 kg/m   Elderly adult female, lying in bed, appears weak and tired RRR, no murmurs, no peripheral edema Respiratory normal, lungs clear without rales or wheezes Abdomen soft, no distention, she has tenderness to palpation deeply in the  left, no rigidity or rebound Attention normal, affect appropriate, judgment insight appear impaired but at baseline, she has generalized weakness but symmetric strength, face symmetric, extraocular movements intact, speech fluent      Data Reviewed: Discussed with infectious diseases team Basic metabolic panel shows normal potassium, creatinine, and sodium AST is 57 Blood culture grew shows ESBL, susceptible to Zosyn and meropenem  Family Communication: Son by phone   Disposition: Status is: Inpatient Was admitted with ESBL bacteremia due to UTI  She seems to be improving clinically but she has some recurrent fever.  We will obtain imaging of the abdomen to make sure that shows no intra-abdominal infection given her recurrent fever  If negative, likely discharge to SNF for ALF with [facility] RN for 7 days of meropenem       Author: Alberteen Sam, MD 04/18/2023 2:48 PM  For on call review [email or web address].

## 2023-04-19 ENCOUNTER — Inpatient Hospital Stay (HOSPITAL_COMMUNITY): Payer: Medicare Other

## 2023-04-19 DIAGNOSIS — R569 Unspecified convulsions: Secondary | ICD-10-CM | POA: Diagnosis not present

## 2023-04-19 DIAGNOSIS — E876 Hypokalemia: Secondary | ICD-10-CM | POA: Diagnosis not present

## 2023-04-19 DIAGNOSIS — I679 Cerebrovascular disease, unspecified: Secondary | ICD-10-CM | POA: Diagnosis not present

## 2023-04-19 DIAGNOSIS — A419 Sepsis, unspecified organism: Secondary | ICD-10-CM | POA: Diagnosis not present

## 2023-04-19 LAB — CBC
HCT: 37.2 % (ref 36.0–46.0)
Hemoglobin: 12.3 g/dL (ref 12.0–15.0)
MCH: 30.2 pg (ref 26.0–34.0)
MCHC: 33.1 g/dL (ref 30.0–36.0)
MCV: 91.4 fL (ref 80.0–100.0)
Platelets: 181 10*3/uL (ref 150–400)
RBC: 4.07 MIL/uL (ref 3.87–5.11)
RDW: 13.7 % (ref 11.5–15.5)
WBC: 5.9 10*3/uL (ref 4.0–10.5)
nRBC: 0 % (ref 0.0–0.2)

## 2023-04-19 LAB — BASIC METABOLIC PANEL
Anion gap: 11 (ref 5–15)
BUN: 15 mg/dL (ref 8–23)
CO2: 20 mmol/L — ABNORMAL LOW (ref 22–32)
Calcium: 8.2 mg/dL — ABNORMAL LOW (ref 8.9–10.3)
Chloride: 101 mmol/L (ref 98–111)
Creatinine, Ser: 0.87 mg/dL (ref 0.44–1.00)
GFR, Estimated: >60 mL/min (ref >60)
Glucose, Bld: 93 mg/dL (ref 70–99)
Potassium: 3.7 mmol/L (ref 3.5–5.1)
Sodium: 132 mmol/L — ABNORMAL LOW (ref 135–145)

## 2023-04-19 MED ORDER — SODIUM CHLORIDE 0.9 % IV SOLN
1.0000 g | Freq: Three times a day (TID) | INTRAVENOUS | Status: DC
  Administered 2023-04-19 – 2023-04-20 (×2): 1 g via INTRAVENOUS
  Filled 2023-04-19 (×3): qty 20

## 2023-04-19 MED ORDER — IOHEXOL 350 MG/ML SOLN
75.0000 mL | Freq: Once | INTRAVENOUS | Status: AC | PRN
  Administered 2023-04-19: 75 mL via INTRAVENOUS

## 2023-04-19 NOTE — Plan of Care (Signed)
  Problem: Activity: Goal: Risk for activity intolerance will decrease Outcome: Progressing   Problem: Nutrition: Goal: Adequate nutrition will be maintained Outcome: Progressing   Problem: Elimination: Goal: Will not experience complications related to bowel motility Outcome: Progressing   

## 2023-04-19 NOTE — Plan of Care (Signed)

## 2023-04-19 NOTE — TOC Progression Note (Signed)
Transition of Care [facility]) - Progression Note    Patient Details  Name: Jillian Harmon MRN: [id number] Date of Birth: 1950-07-22  Transition of Care [facility] CM/SW Contact  Perseus Westall A [place], [place] Phone Number: 04/19/2023, 3:24 PM  Clinical Narrative:     CSW contacted pt's son, [name], he stated that pt would be agreeable to SNF bed offer at [facility]. CSW updated facility, bed available when pt stable for DC. EDD 04/20/23.    TOC will continue to follow.    Expected Discharge Plan: Memory Care Barriers to Discharge: Continued Medical Work up  Expected Discharge Plan and Services       Living arrangements for the past 2 months: Assisted Living Facility                                       Social Determinants of Health (SDOH) Interventions SDOH Screenings   Food Insecurity: No Food Insecurity (04/16/2023)  Housing: Low Risk  (04/16/2023)  Transportation Needs: No Transportation Needs (04/16/2023)  Utilities: Not At Risk (04/16/2023)  Tobacco Use: Low Risk  (03/25/2023)    Readmission Risk Interventions     No data to display

## 2023-04-19 NOTE — Progress Notes (Signed)
  Progress Note   Patient: Jillian Harmon UYQ:[id number] DOB: 1950/05/24 DOA: 04/16/2023     3 DOS: the patient was seen and examined on 04/19/2023 at 11:20AM      Brief hospital course: 72 y.o. F with dementia, lives in LTC, hx seizures, hx CVA, HTN, and PUD who presented with fever, dysuria and found to have UTI and ESBL E coli bacteremia.     Assessment and Plan: * Sepsis secondary to ESBL E coli bacteremia due to UTI [facility]) CT shows only signs of pyelonephritis, consistent with left upper quadrant pain -Continue meropenem, day 3 of 7     Hyperthyroidism -Continue methimazole  Cerebrovascular disease -Continue simvastatin  Dementia with behavioral disturbance (HCC) - Continue venlafaxine, Seroquel, mirtazapine  Seizures (HCC) - Continue Depakote, Valium          Subjective: Patient is irritable about still being in the hospital, she still has left-sided abdominal pain, but no fever in the last 24 hours, no vomiting, no decreased mentation, no respiratory distress.     Physical Exam: BP 119/76 (BP Location: Left Arm)   Pulse 84   Temp 98.5 F (36.9 C) (Oral)   Resp 16   Ht 5\' 1"  (1.549 m)   Wt 67.3 kg   SpO2 95%   BMI 28.03 kg/m   Elderly adult female, lying in bed, interactive and appropriate RRR, no murmurs, no peripheral edema Respiratory rate normal, lungs clear without rales or wheezes Abdomen with some mild tenderness on the left, no rigidity or rebound Attention normal, affect irritable, judgment and insight appear impaired but at baseline, speech fluent, face symmetric, moves upper extremities with generalized weakness but symmetric strength     Data Reviewed: White blood cell count down to normal Hemoglobin improved to normal Creatinine down to 0.8  Family Communication: Son by phone    Disposition: Status is: Inpatient The patient was admitted with ESBL bacteremia  This is not susceptible to oral agents, show she will  need 7 days of IV antibiotics.  Skilled nursing is needed        Author: Alberteen Sam, MD 04/19/2023 2:52 PM  For on call review [email or web address].

## 2023-04-19 NOTE — [place] FL2 (Signed)
[place] MEDICAID FL2 LEVEL OF CARE FORM     IDENTIFICATION  Patient Name: Jillian Harmon Birthdate: November 01, 1950 Sex: female Admission Date (Current Location): 04/16/2023  [facility] and [place] Number:  Producer, television/film/video and Address:  [facility], [address] [place], [place] [address]      Provider Number: [id number]  Attending Physician Name and Address:  Alberteen Sam, *  Relative Name and Phone Number:  Elois, Barkdoll)  [phone]    Current Level of Care: Hospital Recommended Level of Care: Skilled Nursing Facility Prior Approval Number:    Date Approved/Denied:   PASRR Number: [id number]  Discharge Plan: SNF    Current Diagnoses: Patient Active Problem List   Diagnosis Date Noted   Hypokalemia 04/17/2023   Cerebrovascular disease 04/17/2023   Essential hypertension 04/17/2023   Hyperthyroidism 04/17/2023   Sepsis secondary to ESBL E coli bacteremia due to UTI (HCC) 04/16/2023   Dementia with behavioral disturbance (HCC) 03/25/2023   MDD (major depressive disorder) 03/25/2023   Incarcerated incisional hernia s/p lap repair w mesh 01/30/2019 01/30/2019   Seizures (HCC) 11/19/2015   Memory difficulty 11/19/2015   Faintness    Gait instability    Acute blood loss anemia 10/18/2015   Alcohol dependence (HCC) 10/18/2015   Symptomatic anemia     Orientation RESPIRATION BLADDER Height & Weight     Self  Normal Incontinent Weight: 148 lb 5.9 oz (67.3 kg) Height:  5\' 1"  (154.9 cm)  BEHAVIORAL SYMPTOMS/MOOD NEUROLOGICAL BOWEL NUTRITION STATUS    Convulsions/Seizures Continent Diet (see DC summary)  AMBULATORY STATUS COMMUNICATION OF NEEDS Skin   Limited Assist Verbally Normal                       Personal Care Assistance Level of Assistance  Bathing, Feeding, Dressing Bathing Assistance: Maximum assistance Feeding assistance: Limited assistance Dressing Assistance: Maximum assistance      Functional Limitations Info  Sight, Hearing, Speech Sight Info: Adequate Hearing Info: Adequate Speech Info: Adequate    SPECIAL CARE FACTORS FREQUENCY  PT (By licensed PT), OT (By licensed OT)     PT Frequency: 5x/week OT Frequency: 5x/week            Contractures Contractures Info: Not present    Additional Factors Info  Code Status, Allergies Code Status Info: FULL Allergies Info: Sertraline           Current Medications (04/19/2023):  This is the current hospital active medication list Current Facility-Administered Medications  Medication Dose Route Frequency Provider Last Rate Last Admin   acetaminophen (TYLENOL) tablet 1,000 mg  1,000 mg Oral Q8H PRN Skip Mayer A, MD   1,000 mg at 04/18/23 0752   albuterol (PROVENTIL) (2.5 MG/3ML) 0.083% nebulizer solution 2.5 mg  2.5 mg Nebulization Q2H PRN Lurline Del, MD       diazepam (VALIUM) tablet 5 mg  5 mg Oral TID Skip Mayer A, MD   5 mg at 04/19/23 1001   divalproex (DEPAKOTE SPRINKLE) capsule 125 mg  125 mg Oral TID Skip Mayer A, MD   125 mg at 04/19/23 1001   heparin injection 5,000 Units  5,000 Units Subcutaneous Q8H Skip Mayer A, MD   5,000 Units at 04/19/23 0624   meropenem (MERREM) 1 g in sodium chloride 0.9 % 100 mL IVPB  1 g Intravenous Q12H Sundil, Subrina, MD 200 mL/hr at 04/19/23 1005 1 g at 04/19/23 1005   methimazole (TAPAZOLE) tablet 5  mg  5 mg Oral BID Alberteen Sam, MD   5 mg at 04/19/23 1001   mirtazapine (REMERON) tablet 7.5 mg  7.5 mg Oral QHS Skip Mayer A, MD   7.5 mg at 04/18/23 2101   QUEtiapine (SEROQUEL) tablet 12.5 mg  12.5 mg Oral Q12H PRN Lurline Del, MD       QUEtiapine (SEROQUEL) tablet 12.5 mg  12.5 mg Oral BID Skip Mayer A, MD   12.5 mg at 04/19/23 1001   simvastatin (ZOCOR) tablet 40 mg  40 mg Oral Daily Skip Mayer A, MD   40 mg at 04/19/23 1001   venlafaxine (EFFEXOR) tablet 75 mg  75 mg Oral q AM Lurline Del, MD   75 mg at 04/19/23 1610   venlafaxine XR (EFFEXOR-XR) 24 hr capsule 150 mg  150 mg Oral q AM Lurline Del, MD   150 mg at 04/19/23 9604     Discharge Medications: Please see discharge summary for a list of discharge medications.  Relevant Imaging Results:  Relevant Lab Results:   Additional Information SSN: [id number] IV ABX until 12/16  Baron Parmelee A [place], [place]

## 2023-04-20 DIAGNOSIS — N390 Urinary tract infection, site not specified: Secondary | ICD-10-CM | POA: Diagnosis not present

## 2023-04-20 DIAGNOSIS — A419 Sepsis, unspecified organism: Secondary | ICD-10-CM | POA: Diagnosis not present

## 2023-04-20 MED ORDER — DIAZEPAM 5 MG PO TABS: 5.0000 mg | ORAL_TABLET | Freq: Three times a day (TID) | ORAL | 0 refills | Status: AC

## 2023-04-20 MED ORDER — SODIUM CHLORIDE 0.9% FLUSH: 10.0000 mL | INTRAVENOUS | Status: DC | PRN

## 2023-04-20 MED ORDER — SODIUM CHLORIDE 0.9% FLUSH
10.0000 mL | Freq: Two times a day (BID) | INTRAVENOUS | Status: DC
  Administered 2023-04-20: 10 mL

## 2023-04-20 MED ORDER — ERTAPENEM IV (FOR PTA / DISCHARGE USE ONLY): 1.0000 g | INTRAVENOUS | 0 refills | Status: AC

## 2023-04-20 MED ORDER — SODIUM CHLORIDE 0.9 % IV SOLN
1.0000 g | INTRAVENOUS | Status: DC
  Administered 2023-04-20: 1 g via INTRAVENOUS
  Filled 2023-04-20: qty 1000

## 2023-04-20 NOTE — Care Management Important Message (Signed)
Important Message  Patient Details  Name: Jillian Harmon MRN: [id number] Date of Birth: [birth year]   Important Message Given:  Yes - Medicare IM     Dorena Bodo 04/20/2023, 3:29 PM

## 2023-04-20 NOTE — Progress Notes (Signed)
Occupational Therapy Treatment Patient Details Name: Jillian Harmon MRN: [id number] DOB: [birth year] Today's Date: 04/20/2023   History of present illness Pt is a 72 y/o female admitted 12/9 through the ED from LTC wth complaint of N/V/dysuria, abdominal pain and feveer of 100.5  Work up includes sepsis UTI.  PMHx; alcohol related seizure and dementia, CHI, withdrawal delirium, ETOH abuse, HTN, Stroke, SDH,   OT comments  Pt. Seen for skilled OT treatment.  Able to complete bed mobility, seated grooming tasks, and side steps for back to bed with min a and cues for sequencing throughout.  Will cont. With acute OT POC while pt. Here.        If plan is discharge home, recommend the following:  A little help with walking and/or transfers;A little help with bathing/dressing/bathroom;A lot of help with bathing/dressing/bathroom;Assistance with cooking/housework;Direct supervision/assist for medications management;Direct supervision/assist for financial management;Assist for transportation;Help with stairs or ramp for entrance   Equipment Recommendations  None recommended by OT    Recommendations for Other Services      Precautions / Restrictions Precautions Precautions: Fall Restrictions Weight Bearing Restrictions Per Provider Order: No       Mobility Bed Mobility Overal bed mobility: Needs Assistance Bed Mobility: Supine to Sit, Sit to Supine     Supine to sit: Min assist, HOB elevated, Used rails Sit to supine: Min assist   General bed mobility comments: cues to scoot to eob with feet on the floor once up and sitting, for back to bed pt. in supine but required more physical assistance to get towards center of bed and manage iv line    Transfers Overall transfer level: Needs assistance Equipment used: Rolling walker (2 wheels) Transfers: Sit to/from Stand Sit to Stand: Min assist           General transfer comment: pt. stood eob and sat without warning. when  asked if she felt like that was going to happen she said yes. pt. states "it seemed like an easy sit" physical guidance and max cues for side stepping towards hob     Balance                                           ADL either performed or assessed with clinical judgement   ADL Overall ADL's : Needs assistance/impaired     Grooming: Oral care;Wash/dry face;Wash/dry hands;Sitting Grooming Details (indicate cue type and reason): no seated lob noted, pt. able to complete familiar tasks without cueing                             Functional mobility during ADLs: Minimal assistance General ADL Comments: side step towards hob x 6 steps with physical and verbal support for step initiation and completing towards hob    Extremity/Trunk Assessment              Vision       Perception     Praxis      Cognition Arousal: Alert Behavior During Therapy: Flat affect Overall Cognitive Status: History of cognitive impairments - at baseline                                 General Comments: Cont. to talk about home and wanting to "do whatever  i have to do to get home" states she lives in [place]        Exercises      Shoulder Instructions       General Comments      Pertinent Vitals/ Pain       Pain Assessment Pain Assessment: Faces Faces Pain Scale: Hurts a little bit Pain Location: stomach Pain Descriptors / Indicators: Aching, Guarding  Home Living                                          Prior Functioning/Environment              Frequency  Min 1X/week        Progress Toward Goals  OT Goals(current goals can now be found in the care plan section)  Progress towards OT goals: Progressing toward goals     Plan      Co-evaluation                 AM-PAC OT "6 Clicks" Daily Activity     Outcome Measure   Help from another person eating meals?: A Little Help from another person  taking care of personal grooming?: A Little Help from another person toileting, which includes using toliet, bedpan, or urinal?: A Lot Help from another person bathing (including washing, rinsing, drying)?: A Lot Help from another person to put on and taking off regular upper body clothing?: A Little Help from another person to put on and taking off regular lower body clothing?: A Lot 6 Click Score: 15    End of Session Equipment Utilized During Treatment: Rolling walker (2 wheels)  OT Visit Diagnosis: Unsteadiness on feet (R26.81);Muscle weakness (generalized) (M62.81);Other symptoms and signs involving cognitive function   Activity Tolerance Patient tolerated treatment well   Patient Left in bed;with call bell/phone within reach;with bed alarm set   Nurse Communication Other (comment) (alerted need for new pure wik placment at end of session)        Time: 9604-5409 OT Time Calculation (min): 15 min  Charges: OT General Charges $OT Visit: 1 Visit OT Treatments $Self Care/Home Management : 8-22 mins  Jillian Harmon, COTA/L Acute Rehabilitation [phone]   Jillian Harmon-COTA/L 04/20/2023, 9:47 AM

## 2023-04-20 NOTE — Progress Notes (Signed)
Tried calling [facility] to give report 3 times and wasn't successful.

## 2023-04-20 NOTE — Discharge Summary (Signed)
Physician Discharge Summary   Patient: Jillian Harmon MRN: [id number] DOB: May 16, 1950  Admit date:     04/16/2023  Discharge date: 04/20/23  Discharge Physician: Alberteen Sam   PCP: Knox Royalty, MD     Recommendations at discharge:  Follow up with PCP Dr. Yetta Barre within 1 week of discharge from SNF SNF: Please obtain BMP, CBC/D, ESR and CRP at conclusion of ertapenem  Stop levothyroxine, continue methimazole, check TSH, T3, fT4 in 4 weeks, refer back to Endocrinology Dr. Kathreen Cosier     Discharge Diagnoses: Principal Problem:   Sepsis secondary to ESBL E coli bacteremia due to UTI [facility]) Active Problems:   Seizures (HCC)   Dementia with behavioral disturbance (HCC)   Hypokalemia   Cerebrovascular disease   Essential hypertension   Hyperthyroidism      Hospital Course: 72 y.o. F with dementia, lives in LTC, hx seizures, hx CVA, HTN, and PUD who presented with fever, dysuria and found to have UTI and ESBL E coli bacteremia.       * Sepsis secondary to ESBL E coli bacteremia due to UTI (HCC) P/w tachycardia, fever, and AKI, blood and urine cultures grew ESBL, sensitive to carbapenems only.  Fever dissipated, repeat blood cultures no growth at 48 hours, due to abdominal discomfort in the left, CT abdomen and pelvis with contrast was obtained that showed some pyelonephritis of the left kidney, no abscess, no other concerning findings.  Discharged to complete 7 days of ertapenem.    Hyperthyroidism TSH normal, free T4 normal, T3 slightly low.    Appears to be on both LT4 supplement and methimazole.    I see no Endocrinology follow up notes since 2021, unsure if they are outside [facility] system.    Recommend to stop levothyroxine at this time, continue methimazole.  Please reestablish care with endocrinology as soon as able, and repeat TSH, T3, free T4 in 4 weeks.             The [facility] Controlled Substances Registry was reviewed  for this patient prior to discharge.  Consultants: None Procedures performed: CT abdomen  Disposition: Skilled nursing facility Diet recommendation:  Cardiac diet  DISCHARGE MEDICATION: Allergies as of 04/20/2023       Reactions   Sertraline Other (See Comments), Anxiety   Causes hallucinations/paranoid thoughts Other Reaction(s): Confusion (intolerance), Mental Status Changes        Medication List     STOP taking these medications    levothyroxine 50 MCG tablet Commonly known as: SYNTHROID       TAKE these medications    acetaminophen 500 MG tablet Commonly known as: TYLENOL Take 1,000 mg by mouth every 8 (eight) hours as needed for moderate pain (pain score 4-6) or headache.   dextromethorphan-guaiFENesin 10-100 MG/5ML liquid Commonly known as: ROBITUSSIN-DM Take 10 mLs by mouth every 4 (four) hours as needed for cough.   diazepam 5 MG tablet Commonly known as: VALIUM Take 1 tablet (5 mg total) by mouth 3 (three) times daily.   divalproex 125 MG capsule Commonly known as: DEPAKOTE SPRINKLE Take 1 capsule (125 mg total) by mouth 3 (three) times daily.   ertapenem IVPB Commonly known as: INVANZ Inject 1 g into the vein daily for 4 days. Indication:  ESBL pyelo First Dose: Yes Last Day of Therapy:  04/24/23 Labs - Once weekly:  CBC/D and BMP, Labs - Once weekly: ESR and CRP Method of administration: Mini-Bag Plus / Gravity Pull midline at the completion of IV  antibiotic therapy Method of administration may be changed at the discretion of home infusion pharmacist based upon assessment of the patient and/or caregiver's ability to self-administer the medication ordered.   feeding supplement (ENSURE COMPLETE) Liqd Take 237 mLs by mouth 2 (two) times daily.   methimazole 5 MG tablet Commonly known as: TAPAZOLE Take 5 mg by mouth 2 (two) times daily.   mirtazapine 7.5 MG tablet Commonly known as: REMERON Take 7.5 mg by mouth at bedtime.   QUEtiapine 25  MG tablet Commonly known as: SEROQUEL Take 12.5 mg by mouth 2 (two) times daily.   QUEtiapine 25 MG tablet Commonly known as: SEROQUEL Take 0.5 tablets (12.5 mg total) by mouth every 12 (twelve) hours as needed (severe agitation).   simvastatin 40 MG tablet Commonly known as: ZOCOR Take 40 mg by mouth daily.   venlafaxine 75 MG tablet Commonly known as: EFFEXOR Take 75 mg by mouth in the morning.   venlafaxine XR 150 MG 24 hr capsule Commonly known as: EFFEXOR-XR Take 150 mg by mouth in the morning.               Home Infusion Instuctions  (From admission, onward)           Start     Ordered   04/20/23 0000  Home infusion instructions       Comments: SNF  Question:  Instructions  Answer:  Flushing of vascular access device: 0.9% NaCl pre/post medication administration and prn patency; Heparin 100 u/ml, 5ml for implanted ports and Heparin 10u/ml, 5ml for all other central venous catheters.   04/20/23 0912             Discharge Instructions     Home infusion instructions   Complete by: As directed    SNF   Instructions: Flushing of vascular access device: 0.9% NaCl pre/post medication administration and prn patency; Heparin 100 u/ml, 5ml for implanted ports and Heparin 10u/ml, 5ml for all other central venous catheters.   Increase activity slowly   Complete by: As directed        Discharge Exam: Filed Weights   04/17/23 0500 04/18/23 0358 04/20/23 0548  Weight: 66.4 kg 67.3 kg 67.5 kg    General: Pt is alert, awake, not in acute distress, lying in bed, eating breakfast Cardiovascular: RRR, nl S1-S2, no murmurs appreciated.   No LE edema.   Respiratory: Normal respiratory rate and rhythm.  CTAB without rales or wheezes. Abdominal: Abdomen soft and non-tender.  No distension or HSM.   Neuro/Psych: Strength symmetric in upper and lower extremities.  Judgment and insight appear impaired but at baseline.   Condition at discharge: fair  The results  of significant diagnostics from this hospitalization (including imaging, microbiology, ancillary and laboratory) are listed below for reference.   Imaging Studies: CT ABDOMEN PELVIS W CONTRAST Result Date: 04/19/2023 CLINICAL DATA:  Urinary tract infection, recurrent/complicated. Sepsis. EXAM: CT ABDOMEN AND PELVIS WITH CONTRAST TECHNIQUE: Multidetector CT imaging of the abdomen and pelvis was performed using the standard protocol following bolus administration of intravenous contrast. RADIATION DOSE REDUCTION: This exam was performed according to the departmental dose-optimization program which includes automated exposure control, adjustment of the mA and/or kV according to patient size and/or use of iterative reconstruction technique. CONTRAST:  75mL OMNIPAQUE IOHEXOL 350 MG/ML SOLN COMPARISON:  Renal ultrasound 04/16/2023 FINDINGS: Lower chest: Mild bronchiectasis at both lung bases. Mild volume loss at both lung bases. Trace left pleural fluid. Hepatobiliary: Cholecystectomy. Normal appearance of the liver. No  focal liver lesion. No significant biliary dilatation. Pancreas: Unremarkable. No pancreatic ductal dilatation or surrounding inflammatory changes. Spleen: Normal in size without focal abnormality. Adrenals/Urinary Tract: Normal adrenal glands. Edematous tissue involving the posterior aspect of the left kidney upper pole. Mild left perinephric stranding. Negative for hydronephrosis. Small bilateral renal cysts. No suspicious renal lesion. No significant edema or inflammatory changes involving the right kidney. Urinary bladder is moderately distended with a small amount of gas. Gas could be iatrogenic. No significant ureter dilatation. Stomach/Bowel: Normal appearance of the stomach. No evidence for bowel obstruction. No focal bowel inflammation. Vascular/Lymphatic: Minimal atherosclerotic disease in the abdominal aorta. Negative for an abdominal aortic aneurysm. No significant abdominal or pelvic  lymph node enlargement. Reproductive: Uterus and bilateral adnexa are unremarkable. Other: Negative for free fluid. Negative for free air. Supraumbilical ventral hernia at the midline contains fat. The fascial defect associated with this ventral hernia measures 2.1 cm in width and 1.6 cm in the craniocaudal dimension. Small amount of gas in the left anterior abdominal subcutaneous tissues is likely from recent injection sites. Musculoskeletal: Disc space narrowing at L5-S1. Mild anterior wedge deformity involving the L1 vertebral body. Suspect this is chronic and could be related to a fracture and/or prominent Schmorl's node in this area. IMPRESSION: 1. Edematous tissue involving the left kidney upper pole with mild left perinephric stranding. Findings are compatible with pyelonephritis. 2. Small amount of gas in the urinary bladder. This could be iatrogenic. 3. Supraumbilical ventral hernia contains fat. 4. Mild bronchiectasis at both lung bases. Trace left pleural fluid. Electronically Signed   By: Richarda Overlie M.D.   On: 04/19/2023 14:03   US RENAL Result Date: 04/16/2023 CLINICAL DATA:  Acute kidney injury. EXAM: RENAL / URINARY TRACT ULTRASOUND COMPLETE COMPARISON:  Abdominal ultrasound dated March 05, 2017. FINDINGS: Right Kidney: Renal measurements: 9.9 x 5.2 x 5.4 cm = volume: 144 mL. Echogenicity within normal limits. No mass or hydronephrosis visualized. Left Kidney: Renal measurements: 12.1 x 6.2 x 6.8 cm = volume: 266 mL. Echogenicity within normal limits. No mass or hydronephrosis visualized. Bladder: Appears normal for degree of bladder distention. Other: None. IMPRESSION: 1. Normal renal ultrasound. Electronically Signed   By: Obie Dredge M.D.   On: 04/16/2023 10:09   DG Chest Port 1 View Result Date: 04/16/2023 CLINICAL DATA:  72 year old female with possible sepsis. EXAM: PORTABLE CHEST 1 VIEW COMPARISON:  Chest x-ray 03/25/2023. FINDINGS: Lung volumes are low. No consolidative airspace  disease. No pleural effusions. No pneumothorax. No pulmonary nodule or mass noted. Pulmonary vasculature and the cardiomediastinal silhouette are within normal limits. IMPRESSION: 1. Low lung volumes without radiographic evidence of acute cardiopulmonary disease. Electronically Signed   By: Trudie Reed M.D.   On: 04/16/2023 06:33   CT Head Wo Contrast Result Date: 03/25/2023 CLINICAL DATA:  Mental status change, unknown cause EXAM: CT HEAD WITHOUT CONTRAST TECHNIQUE: Contiguous axial images were obtained from the base of the skull through the vertex without intravenous contrast. RADIATION DOSE REDUCTION: This exam was performed according to the departmental dose-optimization program which includes automated exposure control, adjustment of the mA and/or kV according to patient size and/or use of iterative reconstruction technique. COMPARISON:  CT head 11/06/2021. FINDINGS: Brain: Stable remote right thalamic infarct. Similar cerebral atrophy. Similar patchy white matter hypodensities, compatible with chronic microvascular ischemic disease. No evidence of acute large vascular territory infarct, acute hemorrhage, mass lesion, midline shift or hydrocephalus. Vascular: No hyperdense vessel identified. Calcific atherosclerosis. Skull: No acute fracture. Sinuses/Orbits: Clear sinuses.  No acute orbital findings. Other: No mastoid effusions. IMPRESSION: Stable head CT.  No evidence of acute intracranial abnormality. Electronically Signed   By: Feliberto Harts M.D.   On: 03/25/2023 18:28   DG Chest Portable 1 View Result Date: 03/25/2023 CLINICAL DATA:  Hypoxia. Anxiety. Possible urinary tract infection. EXAM: PORTABLE CHEST 1 VIEW COMPARISON:  One-view chest x-ray 07/21/2017 FINDINGS: The heart size and mediastinal contours are within normal limits. Both lungs are clear. The visualized skeletal structures are unremarkable. IMPRESSION: Negative one-view chest x-ray. Electronically Signed   By: Marin Roberts M.D.   On: 03/25/2023 13:58    Microbiology: Results for orders placed or performed during the hospital encounter of 04/16/23  Culture, blood (Routine x 2)     Status: Abnormal   Collection Time: 04/16/23  5:55 AM   Specimen: BLOOD  Result Value Ref Range Status   Specimen Description BLOOD LEFT ARM  Final   Special Requests   Final    BOTTLES DRAWN AEROBIC AND ANAEROBIC Blood Culture adequate volume   Culture  Setup Time   Final    GRAM NEGATIVE RODS IN BOTH AEROBIC AND ANAEROBIC BOTTLES CRITICAL RESULT CALLED TO, READ BACK BY AND VERIFIED WITH: Neta Mends [phone] @2027  FH Performed at [facility], [address] [place], [place] [address]    Culture (A)  Final    ESCHERICHIA COLI Confirmed Extended Spectrum Beta-Lactamase Producer (ESBL).  In bloodstream infections from ESBL organisms, carbapenems are preferred over piperacillin/tazobactam. They are shown to have a lower risk of mortality.    Report Status 04/18/2023 FINAL  Final   Organism ID, Bacteria ESCHERICHIA COLI  Final      Susceptibility   Escherichia coli - MIC*    AMPICILLIN >=32 RESISTANT Resistant     CEFEPIME 16 RESISTANT Resistant     CEFTAZIDIME RESISTANT Resistant     CEFTRIAXONE >=64 RESISTANT Resistant     CIPROFLOXACIN >=4 RESISTANT Resistant     GENTAMICIN >=16 RESISTANT Resistant     IMIPENEM <=0.25 SENSITIVE Sensitive     TRIMETH/SULFA >=320 RESISTANT Resistant     AMPICILLIN/SULBACTAM >=32 RESISTANT Resistant     PIP/TAZO 8 SENSITIVE Sensitive ug/mL    * ESCHERICHIA COLI  Blood Culture ID Panel (Reflexed)     Status: Abnormal   Collection Time: 04/16/23  5:55 AM  Result Value Ref Range Status   Enterococcus faecalis NOT DETECTED NOT DETECTED Final   Enterococcus Faecium NOT DETECTED NOT DETECTED Final   Listeria monocytogenes NOT DETECTED NOT DETECTED Final   Staphylococcus species NOT DETECTED NOT DETECTED Final   Staphylococcus aureus (BCID) NOT DETECTED NOT DETECTED  Final   Staphylococcus epidermidis NOT DETECTED NOT DETECTED Final   Staphylococcus lugdunensis NOT DETECTED NOT DETECTED Final   Streptococcus species NOT DETECTED NOT DETECTED Final   Streptococcus agalactiae NOT DETECTED NOT DETECTED Final   Streptococcus pneumoniae NOT DETECTED NOT DETECTED Final   Streptococcus pyogenes NOT DETECTED NOT DETECTED Final   A.calcoaceticus-baumannii NOT DETECTED NOT DETECTED Final   Bacteroides fragilis NOT DETECTED NOT DETECTED Final   Enterobacterales DETECTED (A) NOT DETECTED Final    Comment: Enterobacterales represent a large order of gram negative bacteria, not a single organism. CRITICAL RESULT CALLED TO, READ BACK BY AND VERIFIED WITH: PHARMD EClinton Sawyer [id number] @2027  FH    Enterobacter cloacae complex NOT DETECTED NOT DETECTED Final   Escherichia coli DETECTED (A) NOT DETECTED Final    Comment: CRITICAL RESULT CALLED TO, READ BACK BY AND  VERIFIED WITH: PHARMD EClinton Sawyer [phone] @2027  FH    Klebsiella aerogenes NOT DETECTED NOT DETECTED Final   Klebsiella oxytoca NOT DETECTED NOT DETECTED Final   Klebsiella pneumoniae NOT DETECTED NOT DETECTED Final   Proteus species NOT DETECTED NOT DETECTED Final   Salmonella species NOT DETECTED NOT DETECTED Final   Serratia marcescens NOT DETECTED NOT DETECTED Final   Haemophilus influenzae NOT DETECTED NOT DETECTED Final   Neisseria meningitidis NOT DETECTED NOT DETECTED Final   Pseudomonas aeruginosa NOT DETECTED NOT DETECTED Final   Stenotrophomonas maltophilia NOT DETECTED NOT DETECTED Final   Candida albicans NOT DETECTED NOT DETECTED Final   Candida auris NOT DETECTED NOT DETECTED Final   Candida glabrata NOT DETECTED NOT DETECTED Final   Candida krusei NOT DETECTED NOT DETECTED Final   Candida parapsilosis NOT DETECTED NOT DETECTED Final   Candida tropicalis NOT DETECTED NOT DETECTED Final   Cryptococcus neoformans/gattii NOT DETECTED NOT DETECTED Final   CTX-M ESBL DETECTED (A) NOT DETECTED  Final    Comment: CRITICAL RESULT CALLED TO, READ BACK BY AND VERIFIED WITH: PHARMD EClinton Sawyer [phone] @2027  FH (NOTE) Extended spectrum beta-lactamase detected. Recommend a carbapenem as initial therapy.      Carbapenem resistance IMP NOT DETECTED NOT DETECTED Final   Carbapenem resistance KPC NOT DETECTED NOT DETECTED Final   Carbapenem resistance NDM NOT DETECTED NOT DETECTED Final   Carbapenem resist OXA 48 LIKE NOT DETECTED NOT DETECTED Final   Carbapenem resistance VIM NOT DETECTED NOT DETECTED Final    Comment: Performed at [facility], [address] [place], [place] [address]  Culture, blood (Routine x 2)     Status: Abnormal   Collection Time: 04/16/23  5:56 AM   Specimen: BLOOD RIGHT ARM  Result Value Ref Range Status   Specimen Description BLOOD RIGHT ARM  Final   Special Requests   Final    BOTTLES DRAWN AEROBIC ONLY Blood Culture results may not be optimal due to an inadequate volume of blood received in culture bottles   Culture  Setup Time   Final    GRAM NEGATIVE RODS AEROBIC BOTTLE ONLY CRITICAL RESULT CALLED TO, READ BACK BY AND VERIFIED WITH: PHARMD EClinton Sawyer [phone] @2027  FH    Culture (A)  Final    ESCHERICHIA COLI SUSCEPTIBILITIES PERFORMED ON PREVIOUS CULTURE WITHIN THE LAST 5 DAYS. Performed at [facility], [address], [place], [place] [address]    Report Status 04/18/2023 FINAL  Final  Urine Culture     Status: Abnormal   Collection Time: 04/16/23  7:00 AM   Specimen: Urine, Random  Result Value Ref Range Status   Specimen Description URINE, RANDOM  Final   Special Requests   Final    NONE Reflexed from [id number] Performed at [facility], [address] [place], [place] [address]    Culture >=100,000 COLONIES/mL ESCHERICHIA COLI (A)  Final   Report Status 04/18/2023 FINAL  Final   Organism ID, Bacteria ESCHERICHIA COLI (A)  Final      Susceptibility   Escherichia coli - MIC*    AMPICILLIN >=32 RESISTANT Resistant      CEFAZOLIN >=64 RESISTANT Resistant     CEFEPIME 16 RESISTANT Resistant     CEFTRIAXONE >=64 RESISTANT Resistant     CIPROFLOXACIN >=4 RESISTANT Resistant     GENTAMICIN >=16 RESISTANT Resistant     IMIPENEM <=0.25 SENSITIVE Sensitive     NITROFURANTOIN <=16 SENSITIVE Sensitive     TRIMETH/SULFA >=320  RESISTANT Resistant     AMPICILLIN/SULBACTAM >=32 RESISTANT Resistant     PIP/TAZO 8 SENSITIVE Sensitive ug/mL    * >=100,000 COLONIES/mL ESCHERICHIA COLI  MRSA Next Gen by PCR, Nasal     Status: None   Collection Time: 04/17/23 12:46 PM   Specimen: Nasal Mucosa; Nasal Swab  Result Value Ref Range Status   MRSA by PCR Next Gen NOT DETECTED NOT DETECTED Final    Comment: (NOTE) The GeneXpert MRSA Assay (FDA approved for NASAL specimens only), is one component of a comprehensive MRSA colonization surveillance program. It is not intended to diagnose MRSA infection nor to guide or monitor treatment for MRSA infections. Test performance is not FDA approved in patients less than 63 years old. Performed at [facility], [address] [place], [place] [address]   Culture, blood (Routine X 2) w Reflex to ID Panel     Status: None (Preliminary result)   Collection Time: 04/18/23  8:48 AM   Specimen: BLOOD LEFT HAND  Result Value Ref Range Status   Specimen Description BLOOD LEFT HAND  Final   Special Requests   Final    BOTTLES DRAWN AEROBIC AND ANAEROBIC Blood Culture results may not be optimal due to an inadequate volume of blood received in culture bottles   Culture   Final    NO GROWTH < 24 HOURS Performed at [facility], [address], [place], [place] [address]    Report Status PENDING  Incomplete  Culture, blood (Routine X 2) w Reflex to ID Panel     Status: None (Preliminary result)   Collection Time: 04/18/23  8:48 AM   Specimen: BLOOD LEFT HAND  Result Value Ref Range Status   Specimen Description BLOOD LEFT HAND  Final   Special Requests   Final    BOTTLES  DRAWN AEROBIC AND ANAEROBIC Blood Culture results may not be optimal due to an inadequate volume of blood received in culture bottles   Culture   Final    NO GROWTH < 24 HOURS Performed at [facility], [address], [place], [place] [address]    Report Status PENDING  Incomplete    Labs: CBC: Recent Labs  Lab 04/16/23 0555 04/17/23 0540 04/18/23 0633 04/19/23 1006  WBC 13.7* 12.1* 7.6 5.9  NEUTROABS 10.9*  --   --   --   HGB 11.8* 11.7* 13.6 12.3  HCT 36.5 35.5* 41.7 37.2  MCV 94.8 93.9 94.1 91.4  PLT 221 159 156 181   Basic Metabolic Panel: Recent Labs  Lab 04/16/23 0555 04/17/23 0540 04/18/23 0633 04/19/23 1006  NA 135 137 135 132*  K 3.6 3.3* 4.4 3.7  CL 106 108 104 101  CO2 18* 20* 19* 20*  GLUCOSE 142* 107* 85 93  BUN 20 15 18 15   CREATININE 1.23* 1.17* 1.16* 0.87  CALCIUM 8.3* 7.8* 8.7* 8.2*   Liver Function Tests: Recent Labs  Lab 04/16/23 0555 04/17/23 0540 04/18/23 0633  AST 37 29 57*  ALT 30 24 40  ALKPHOS 41 40 46  BILITOT 0.7 0.6 0.7  PROT 6.7 6.4* 7.3  ALBUMIN 3.2* 2.6* 3.0*   CBG: Recent Labs  Lab 04/17/23 2334  GLUCAP 101*    Discharge time spent: approximately 35 minutes spent on discharge counseling, evaluation of patient on day of discharge, and coordination of discharge planning with nursing, social work, pharmacy and case management  Signed: Alberteen Sam, MD [facility] 04/20/2023

## 2023-04-20 NOTE — TOC Transition Note (Signed)
Transition of Care [facility]) - Discharge Note   Patient Details  Name: Jillian Harmon MRN: [id number] Date of Birth: Sep 13, 1950  [facility] of [facility]) CM/SW Contact:  Cyana Shook A [place], LCSWA Phone Number: 04/20/2023, 1:22 PM   Clinical Narrative:     Patient will DC to: [facility]  Anticipated DC date: 04/20/23  Family notified: Sherlynn Stalls  Transport by: Sharin Mons      Per MD patient ready for DC to [facility]. RN, patient, patient's family, and facility notified of DC. Discharge Summary and FL2 sent to facility. RN to call report prior to discharge [phone]). DC packet on chart. Ambulance transport requested for patient.     CSW will sign off for now as social work intervention is no longer needed. Please consult [place] again if new needs arise.   Final next level of care: Skilled Nursing Facility Barriers to Discharge: Barriers Resolved   Patient Goals and CMS Choice            Discharge Placement              Patient chooses bed at: [facility] Patient to be transferred to facility by: [facility] Name of family member notified: Sherlynn Stalls Patient and family notified of of transfer: 04/20/23  Discharge Plan and Services Additional resources added to the After Visit Summary for                                       Social Drivers of Health (SDOH) Interventions SDOH Screenings   Food Insecurity: No Food Insecurity (04/16/2023)  Housing: Low Risk  (04/16/2023)  Transportation Needs: No Transportation Needs (04/16/2023)  Utilities: Not At Risk (04/16/2023)  Tobacco Use: Low Risk  (03/25/2023)     Readmission Risk Interventions     No data to display

## 2023-04-20 NOTE — Progress Notes (Signed)
PHARMACY CONSULT NOTE FOR:  OUTPATIENT  PARENTERAL ANTIBIOTIC THERAPY (OPAT)  Informational as planned discharge to SNF  Indication: ESBL pyelo Regimen: Ertapenem 1g IV every 24 hours End date: 04/24/23  IV antibiotic discharge orders are pended. To discharging provider:  please sign these orders via discharge navigator,  Select New Orders & click on the button choice - Manage This Unsigned Work.     Thank you for allowing pharmacy to be a part of this patient's care.  Georgina Pillion, PharmD, BCPS, BCIDP Infectious Diseases Clinical Pharmacist 04/20/2023 8:41 AM   **Pharmacist phone directory can now be found on [email or web address] ([email or web address]).  Listed under [facility].

## 2023-04-23 LAB — CULTURE, BLOOD (ROUTINE X 2)
Culture: NO GROWTH
Culture: NO GROWTH

## 2023-05-10 ENCOUNTER — Encounter (HOSPITAL_COMMUNITY): Payer: Self-pay

## 2023-05-10 ENCOUNTER — Emergency Department (HOSPITAL_COMMUNITY)
Admission: EM | Admit: 2023-05-10 | Discharge: 2023-05-11 | Disposition: A | Discharge status: Home / Self Care | Payer: Medicare Other | Attending: Emergency Medicine | Admitting: Emergency Medicine

## 2023-05-10 ENCOUNTER — Emergency Department (HOSPITAL_COMMUNITY): Payer: Medicare Other

## 2023-05-10 DIAGNOSIS — E039 Hypothyroidism, unspecified: Secondary | ICD-10-CM | POA: Insufficient documentation

## 2023-05-10 DIAGNOSIS — Z79899 Other long term (current) drug therapy: Secondary | ICD-10-CM | POA: Insufficient documentation

## 2023-05-10 DIAGNOSIS — W19XXXA Unspecified fall, initial encounter: Secondary | ICD-10-CM | POA: Insufficient documentation

## 2023-05-10 DIAGNOSIS — Y9364 Activity, baseball: Secondary | ICD-10-CM | POA: Insufficient documentation

## 2023-05-10 DIAGNOSIS — I10 Essential (primary) hypertension: Secondary | ICD-10-CM | POA: Diagnosis not present

## 2023-05-10 DIAGNOSIS — F0390 Unspecified dementia without behavioral disturbance: Secondary | ICD-10-CM | POA: Insufficient documentation

## 2023-05-10 DIAGNOSIS — S0101XA Laceration without foreign body of scalp, initial encounter: Secondary | ICD-10-CM | POA: Insufficient documentation

## 2023-05-10 MED ORDER — ACETAMINOPHEN 500 MG PO TABS
1000.0000 mg | ORAL_TABLET | Freq: Once | ORAL | Status: AC
  Administered 2023-05-10: 1000 mg via ORAL
  Filled 2023-05-10: qty 2

## 2023-05-10 NOTE — Discharge Instructions (Signed)
 We evaluated Jillian Harmon for her fall.  She did have a small wound to the back of her head.  We repaired this with staples.  She should have this removed in 10 to 14 days.  We also obtained CT scans of her head which did not show any dangerous injuries.  Please bring her back to the emergency department if you develop any new or worsening symptoms such as severe headaches, vomiting, abnormal mental status, or any other concerning symptoms.

## 2023-05-10 NOTE — ED Notes (Addendum)
 Pt repeatedly asking to take C-collar off. This RN explained to patient importance of c-collar/re-directed patient. Explained to patient plan of care at this time. Pt agreeing to keep c-collar on until results read.

## 2023-05-10 NOTE — ED Triage Notes (Signed)
 Lives in [facility]. Wheelchair bound. Unwitnessed fall with laceration noted to back of head. Pt also reports head and neck pain. Alert and oriented x 2 and at baseline per facility staff. Not on blood thinners. Unknown LOC.

## 2023-05-10 NOTE — ED Provider Notes (Signed)
 [place] EMERGENCY DEPARTMENT AT [facility] Provider Note  CSN: [id number] Arrival date & time: 05/10/23 1746  Chief Complaint(s) Fall  HPI Jillian Harmon is a 73 y.o. female with history of dementia presenting to the emergency department with fall.  Per EMS, patient had unwitnessed fall at nursing facility.  History is very limited due to underlying dementia.  The patient reports that she fell while playing baseball with her children.  She does report some mild pain to the back of her head.   Past Medical History Past Medical History:  Diagnosis Date   Alcohol related seizure (HCC)    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   Alcohol-induced persisting dementia (HCC)    Anemia    Anxiety    Closed head injury 2015; early 2017   fell in my house   Delirium, withdrawal, alcoholic (HCC) 2012   Depression    Gastrointestinal hemorrhage associated with gastric ulcer    H/O ETOH abuse    has not had anything to 2 months (10/2015)   History of blood transfusion 10/18/2015   anemia   History of esophagogastroduodenoscopy (EGD) 07/2013   erosive gastritis, no varices ([facility])   History of gastric ulcer    History of subdural hematoma 07/2017   right subdural hematoma extending between the leaves of tentorium on the right side.   Hypertension    per Delon Gavel at [facility]   Hypoglycemia    Hypothyroidism    Memory difficulty 11/19/2015   Memory loss    Seasonal allergies    Seizures (HCC) 11/19/2015   Stroke (HCC) 2007   dr's weren't sure if it was a stroke or seizure (10/19/2015)   Subdural hematoma (HCC)    Syncope and collapse    Ventral hernia    Patient Active Problem List   Diagnosis Date Noted   Hypokalemia 04/17/2023   Cerebrovascular disease 04/17/2023   Essential hypertension 04/17/2023   Hyperthyroidism 04/17/2023   Sepsis secondary to ESBL E coli bacteremia due to UTI (HCC) 04/16/2023   Dementia with behavioral  disturbance (HCC) 03/25/2023   MDD (major depressive disorder) 03/25/2023   Incarcerated incisional hernia s/p lap repair w mesh 01/30/2019 01/30/2019   Seizures (HCC) 11/19/2015   Memory difficulty 11/19/2015   Faintness    Gait instability    Acute blood loss anemia 10/18/2015   Alcohol dependence (HCC) 10/18/2015   Symptomatic anemia    Home Medication(s) Prior to Admission medications   Medication Sig Start Date End Date Taking? Authorizing Provider  acetaminophen  (TYLENOL ) 500 MG tablet Take 1,000 mg by mouth every 8 (eight) hours as needed for moderate pain (pain score 4-6) or headache.    [provider]  dextromethorphan-guaiFENesin  (ROBITUSSIN-DM) 10-100 MG/5ML liquid Take 10 mLs by mouth every 4 (four) hours as needed for cough.    [provider]  diazepam  (VALIUM ) 5 MG tablet Take 1 tablet (5 mg total) by mouth 3 (three) times daily. 04/20/23   Danford, Lonni SQUIBB, MD  divalproex  (DEPAKOTE  SPRINKLE) 125 MG capsule Take 1 capsule (125 mg total) by mouth 3 (three) times daily. 03/27/23 04/26/23  Onuoha, Josephine C, NP  feeding supplement, ENSURE COMPLETE, (ENSURE COMPLETE) LIQD Take 237 mLs by mouth 2 (two) times daily.    [provider]  methimazole  (TAPAZOLE ) 5 MG tablet Take 5 mg by mouth 2 (two) times daily.    [provider]  mirtazapine  (REMERON ) 7.5 MG tablet Take 7.5 mg by mouth at bedtime.  [provider]  QUEtiapine  (SEROQUEL ) 25 MG tablet Take 0.5 tablets (12.5 mg total) by mouth every 12 (twelve) hours as needed (severe agitation). 03/27/23 04/26/23  Onuoha, Josephine C, NP  QUEtiapine  (SEROQUEL ) 25 MG tablet Take 12.5 mg by mouth 2 (two) times daily.    [provider]  simvastatin  (ZOCOR ) 40 MG tablet Take 40 mg by mouth daily.    [provider]  venlafaxine  (EFFEXOR ) 75 MG tablet Take 75 mg by mouth in the morning.    [provider]  venlafaxine  XR (EFFEXOR -XR) 150 MG 24 hr capsule Take  150 mg by mouth in the morning.    [provider]                                                                                                                                    Past Surgical History Past Surgical History:  Procedure Laterality Date   CHOLECYSTECTOMY OPEN     ESOPHAGOGASTRODUODENOSCOPY N/A 10/19/2015   Procedure: ESOPHAGOGASTRODUODENOSCOPY (EGD);  Surgeon: Elspeth Deward Naval, MD;  Location: [facility] ENDOSCOPY;  Service: Gastroenterology;  Laterality: N/A;   TONSILLECTOMY     VENTRAL HERNIA REPAIR N/A 01/30/2019   Procedure: LAPAROSCOPIC INCARCERATED INCISIONAL HERNIA  REPAIR WITH MESH;  Surgeon: Sheldon Elspeth, MD;  Location: [facility] ORS;  Service: General;  Laterality: N/A;   Family History Family History  Problem Relation Age of Onset   Other Mother        Hypoglycemia   Cancer Father    Cancer Paternal Grandfather    Seizures Neg Hx     Social History Social History   Tobacco Use   Smoking status: Never   Smokeless tobacco: Never  Vaping Use   Vaping status: Never Used  Substance Use Topics   Alcohol use: No    Alcohol/week: 0.0 standard drinks of alcohol    Comment: Previous alcohol use, now none   Drug use: No   Allergies Sertraline  Review of Systems Review of Systems  All other systems reviewed and are negative.   Physical Exam Vital Signs  I have reviewed the triage vital signs BP 128/74 (BP Location: Left Arm)   Pulse 77   Temp 98.3 F (36.8 C) (Oral)   Resp 17   Ht 5' 1 (1.549 m)   Wt 67.5 kg   SpO2 93%   BMI 28.12 kg/m  Physical Exam Vitals and nursing note reviewed.  Constitutional:      General: She is not in acute distress.    Appearance: She is well-developed.  HENT:     Head: Normocephalic.     Comments: Small stellate laceration to posterior scalp    Mouth/Throat:     Mouth: Mucous membranes are moist.  Eyes:     Pupils: Pupils are equal, round, and reactive to light.  Cardiovascular:     Rate and Rhythm:  Normal rate and regular rhythm.  Heart sounds: No murmur heard. Pulmonary:     Effort: Pulmonary effort is normal. No respiratory distress.     Breath sounds: Normal breath sounds.  Abdominal:     General: Abdomen is flat.     Palpations: Abdomen is soft.     Tenderness: There is no abdominal tenderness.  Musculoskeletal:        General: No tenderness.     Right lower leg: No edema.     Left lower leg: No edema.     Comments: No midline C, T, L-spine tenderness.  No chest wall tenderness or crepitus.  Full painless range of motion at the bilateral upper extremities including the shoulders, elbows, wrists, hand and fingers, and in the bilateral lower extremities including the hips, knees, ankle, toes.  No focal bony tenderness, injury or deformity.   Skin:    General: Skin is warm and dry.  Neurological:     General: No focal deficit present.     Mental Status: She is alert. Mental status is at baseline.     Comments: Ambulatory with steady gait.  Oriented to self  Psychiatric:        Mood and Affect: Mood normal.        Behavior: Behavior normal.     ED Results and Treatments Labs (all labs ordered are listed, but only abnormal results are displayed) Labs Reviewed - No data to display                                                                                                                        Radiology CT Head Wo Contrast Result Date: 05/10/2023 CLINICAL DATA:  Head trauma EXAM: CT HEAD WITHOUT CONTRAST TECHNIQUE: Contiguous axial images were obtained from the base of the skull through the vertex without intravenous contrast. RADIATION DOSE REDUCTION: This exam was performed according to the departmental dose-optimization program which includes automated exposure control, adjustment of the mA and/or kV according to patient size and/or use of iterative reconstruction technique. COMPARISON:  CT 06/24/2022 FINDINGS: Brain: No acute territorial infarction, hemorrhage or  intracranial mass. Chronic lacunar infarct in the right thalamus. Atrophy and chronic small vessel ischemic changes of the white matter. Stable ventricle size. Vascular: No hyperdense vessels.  Carotid vascular calcification Skull: Normal. Negative for fracture or focal lesion. Sinuses/Orbits: No acute finding. Other: None IMPRESSION: 1. No CT evidence for acute intracranial abnormality. 2. Atrophy and chronic small vessel ischemic changes of the white matter. Small chronic infarct in the right thalamus. Electronically Signed   By: Luke Bun M.D.   On: 05/10/2023 22:21   CT Cervical Spine Wo Contrast Result Date: 05/10/2023 CLINICAL DATA:  Neck trauma (Age >= 65y) EXAM: CT CERVICAL SPINE WITHOUT CONTRAST TECHNIQUE: Multidetector CT imaging of the cervical spine was performed without intravenous contrast. Multiplanar CT image reconstructions were also generated. RADIATION DOSE REDUCTION: This exam was performed according to the departmental dose-optimization program which includes automated exposure control, adjustment of the mA and/or kV  according to patient size and/or use of iterative reconstruction technique. COMPARISON:  CT 11/07/2021 FINDINGS: Alignment: No traumatic subluxation. Skull base and vertebrae: No acute fracture. Vertebral body heights are maintained. The dens and skull base are intact. Soft tissues and spinal canal: No prevertebral fluid or swelling. No visible canal hematoma. Disc levels: Diffuse degenerative disc disease with disc space narrowing and spurring. Multilevel facet hypertrophy. No significant change from prior exam. Upper chest: No acute findings. Other: None. IMPRESSION: 1. No acute fracture or subluxation of the cervical spine. 2. Diffuse degenerative disc disease and facet hypertrophy. Electronically Signed   By: Andrea Gasman M.D.   On: 05/10/2023 22:20    Pertinent labs & imaging results that were available during my care of the patient were reviewed by me and  considered in my medical decision making (see MDM for details).  Medications Ordered in ED Medications  acetaminophen  (TYLENOL ) tablet 1,000 mg (1,000 mg Oral Given 05/10/23 2326)                                                                                                                                     Procedures .Laceration Repair  Date/Time: 05/11/2023 3:34 PM  Performed by: Francesca Elsie CROME, MD Authorized by: Francesca Elsie CROME, MD   Consent:    Consent obtained:  Verbal   Consent given by:  Patient   Risks, benefits, and alternatives were discussed: yes     Risks discussed:  Infection, need for additional repair, nerve damage, pain, vascular damage, poor wound healing, poor cosmetic result, retained foreign body and tendon damage   Alternatives discussed:  No treatment Universal protocol:    Patient identity confirmed:  Verbally with patient and arm band Laceration details:    Location:  Scalp   Scalp location:  Occipital   Length (cm):  1 Exploration:    Imaging outcome: foreign body not noted     Wound exploration: wound explored through full range of motion     Wound extent: areolar tissue not violated, fascia not violated, no foreign body, no signs of injury, no nerve damage, no tendon damage, no underlying fracture and no vascular damage   Treatment:    Area cleansed with:  Saline   Amount of cleaning:  Standard   Irrigation solution:  Sterile saline   Visualized foreign bodies/material removed: no     Debridement:  None   Undermining:  None   Scar revision: no   Skin repair:    Repair method:  Staples   Number of staples:  5 Approximation:    Approximation:  Close Repair type:    Repair type:  Simple Post-procedure details:    Procedure completion:  Tolerated well, no immediate complications   (including critical care time)  Medical Decision Making / ED Course   MDM:  73 year old female presenting to the emergency department after unwitnessed  fall at nursing facility.  Underlying dementia,  patient unable to really provide any meaningful history regarding circumstances of fall but is overall very well-appearing.  She does have a very small laceration to her scalp which has been irrigated and repaired.  She has no signs of any other injuries and is ambulatory.  Her CT scans are negative for any acute injury.  Patient will be discharged back to her nursing facility.  Clinical Course as of 05/11/23 1535  Thu May 10, 2023  2344 5 staples  [WS]    Clinical Course User Index [WS] Francesca Elsie CROME, MD     Additional history obtained: -Additional history obtained from ems   Imaging Studies ordered: I ordered imaging studies including CT head and neck On my interpretation imaging demonstrates no acute injury I independently visualized and interpreted imaging. I agree with the radiologist interpretation   Medicines ordered and prescription drug management: Meds ordered this encounter  Medications   acetaminophen  (TYLENOL ) tablet 1,000 mg    -I have reviewed the patients home medicines and have made adjustments as needed  Reevaluation: After the interventions noted above, I reevaluated the patient and found that their symptoms have improved  Co morbidities that complicate the patient evaluation  Past Medical History:  Diagnosis Date   Alcohol related seizure (HCC)    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   Alcohol-induced persisting dementia (HCC)    Anemia    Anxiety    Closed head injury 2015; early 2017   fell in my house   Delirium, withdrawal, alcoholic (HCC) 2012   Depression    Gastrointestinal hemorrhage associated with gastric ulcer    H/O ETOH abuse    has not had anything to 2 months (10/2015)   History of blood transfusion 10/18/2015   anemia   History of esophagogastroduodenoscopy (EGD) 07/2013   erosive gastritis, no varices ([facility])   History of gastric ulcer    History of subdural  hematoma 07/2017   right subdural hematoma extending between the leaves of tentorium on the right side.   Hypertension    per Delon Gavel at [facility]   Hypoglycemia    Hypothyroidism    Memory difficulty 11/19/2015   Memory loss    Seasonal allergies    Seizures (HCC) 11/19/2015   Stroke [facility]) 2007   dr's weren't sure if it was a stroke or seizure (10/19/2015)   Subdural hematoma (HCC)    Syncope and collapse    Ventral hernia       Dispostion: Disposition decision including need for hospitalization was considered, and patient discharged from emergency department.    Final Clinical Impression(s) / ED Diagnoses Final diagnoses:  Fall, initial encounter  Laceration of scalp, initial encounter     This chart was dictated using voice recognition software.  Despite best efforts to proofread,  errors can occur which can change the documentation meaning.    Francesca Elsie CROME, MD 05/11/23 1535

## 2023-05-10 NOTE — ED Notes (Signed)
 Pt becoming agitated at this time. MD at bedside.

## 2023-05-11 NOTE — ED Notes (Signed)
 Irrigation to back of head complete per EDP.

## 2023-05-11 NOTE — ED Notes (Signed)
 [facility] CALLED FOR TRANSPORT , 4 AHEAD OF PT

## 2023-05-11 NOTE — ED Notes (Signed)
 Pt appears comfortable, laying on stretcher. Even rise and fall of chest noted.

## 2023-06-04 ENCOUNTER — Other Ambulatory Visit: Payer: Self-pay

## 2023-06-04 ENCOUNTER — Emergency Department (HOSPITAL_COMMUNITY)
Admission: EM | Admit: 2023-06-04 | Discharge: 2023-06-05 | Disposition: A | Discharge status: Home / Self Care | Payer: Medicare Other | Attending: Emergency Medicine | Admitting: Emergency Medicine

## 2023-06-04 ENCOUNTER — Emergency Department (HOSPITAL_COMMUNITY): Payer: Medicare Other

## 2023-06-04 ENCOUNTER — Other Ambulatory Visit (HOSPITAL_COMMUNITY): Payer: Self-pay | Admitting: Radiology

## 2023-06-04 ENCOUNTER — Encounter (HOSPITAL_COMMUNITY): Payer: Self-pay | Admitting: Emergency Medicine

## 2023-06-04 DIAGNOSIS — S0181XA Laceration without foreign body of other part of head, initial encounter: Secondary | ICD-10-CM | POA: Insufficient documentation

## 2023-06-04 DIAGNOSIS — M25562 Pain in left knee: Secondary | ICD-10-CM | POA: Diagnosis not present

## 2023-06-04 DIAGNOSIS — S0990XA Unspecified injury of head, initial encounter: Secondary | ICD-10-CM | POA: Insufficient documentation

## 2023-06-04 DIAGNOSIS — F0390 Unspecified dementia without behavioral disturbance: Secondary | ICD-10-CM | POA: Diagnosis not present

## 2023-06-04 DIAGNOSIS — W19XXXA Unspecified fall, initial encounter: Secondary | ICD-10-CM | POA: Insufficient documentation

## 2023-06-04 NOTE — ED Provider Notes (Signed)
[facility]-EMERGENCY DEPT [facility] Emergency Department Provider Note MRN:  [id number]  Arrival date & time: 06/05/23     Chief Complaint   Fall   History of Present Illness   Jillian Harmon is a 73 y.o. year-old female presents to the ED with chief complaint of unwitnessed fall and head injury.  Hx of Dementia.  From [facility].  Patient was reportedly found on the flood.  No reported or documented thinners.  Patient complains of pain on the left side of her face.  She also complains of left knee pain.  She denies other injuries.   Review of Systems  Pertinent positive and negative review of systems noted in HPI.    Physical Exam   Vitals:   06/05/23 0245 06/05/23 0300  BP: (!) 153/86 (!) 148/77  Pulse: 76 77  Resp: 18 (!) 21  Temp:    SpO2: 95% 98%    CONSTITUTIONAL:  non toxic-appearing, NAD NEURO:  Alert and oriented x 3, CN 3-12 grossly intact EYES:  eyes equal and reactive ENT/NECK:  Supple, no stridor, 3 cm left sided facial laceration CARDIO:  normal rate, regular rhythm, appears well-perfused  PULM:  No respiratory distress,  GI/GU:  non-distended,  MSK/SPINE:  No gross deformities, no edema, moves all extremities  SKIN:  no rash, atraumatic, old laceration of scalp with staples in place   *Additional and/or pertinent findings included in MDM below  Diagnostic and Interventional Summary    EKG Interpretation Date/Time:    Ventricular Rate:    PR Interval:    QRS Duration:    QT Interval:    QTC Calculation:   R Axis:      Text Interpretation:         Labs Reviewed  CBC WITH DIFFERENTIAL/PLATELET - Abnormal; Notable for the following components:      Result Value   Hemoglobin 11.9 (*)    All other components within normal limits  BASIC METABOLIC PANEL    DG Knee Complete 4 Views Left  Final Result    DG Chest Port 1 View  Final Result    DG Pelvis Portable  Final Result    CT HEAD WO CONTRAST ( )  Final  Result    CT Cervical Spine Wo Contrast  Final Result      Medications  lidocaine-EPINEPHrine (XYLOCAINE W/EPI) 2 %-1:200000 (PF) injection 20 mL (20 mLs Infiltration Given 06/05/23 0124)  lidocaine-EPINEPHrine-tetracaine (LET) topical gel (3 mLs Topical Given 06/05/23 0125)     Procedures  /  Critical Care .Laceration Repair  Date/Time: 06/05/2023 1:53 AM  Performed by: Roxy Horseman, PA-C Authorized by: Roxy Horseman, PA-C   Consent:    Consent obtained:  Verbal   Consent given by:  Patient   Risks, benefits, and alternatives were discussed: yes     Risks discussed:  Pain, poor wound healing, poor cosmetic result and need for additional repair   Alternatives discussed:  No treatment and delayed treatment Universal protocol:    Procedure explained and questions answered to patient or proxy's satisfaction: yes     Relevant documents present and verified: yes     Test results available: yes     Imaging studies available: yes     Required blood products, implants, devices, and special equipment available: yes     Site/side marked: yes     Immediately prior to procedure, a time out was called: yes     Patient identity confirmed:  Verbally with patient and  arm band Anesthesia:    Anesthesia method:  Topical application and local infiltration   Topical anesthetic:  LET   Local anesthetic:  Lidocaine 1% WITH epi Laceration details:    Location:  Face   Face location:  L cheek   Length (cm):  3 Exploration:    Wound exploration: wound explored through full range of motion and entire depth of wound visualized   Treatment:    Area cleansed with:  Saline   Amount of cleaning:  Standard   Irrigation solution:  Sterile saline Skin repair:    Repair method:  Sutures   Suture size:  4-0   Wound skin closure material used: vicryl rapide.   Suture technique:  Simple interrupted   Number of sutures:  6 Approximation:    Approximation:  Close Repair type:    Repair type:   Simple Post-procedure details:    Dressing:  Open (no dressing)   Procedure completion:  Tolerated well, no immediate complications Suture Removal  Date/Time: 06/05/2023 1:54 AM  Performed by: Roxy Horseman, PA-C Authorized by: Roxy Horseman, PA-C   Consent:    Consent obtained:  Verbal   Consent given by:  Patient   Risks, benefits, and alternatives were discussed: yes     Risks discussed:  Bleeding, pain and wound separation   Alternatives discussed:  No treatment and delayed treatment Universal protocol:    Procedure explained and questions answered to patient or proxy's satisfaction: yes     Relevant documents present and verified: yes     Test results available: yes     Imaging studies available: yes     Required blood products, implants, devices, and special equipment available: yes     Site/side marked: yes     Immediately prior to procedure, a time out was called: yes     Patient identity confirmed:  Verbally with patient Location:    Location:  Head/neck   Head/neck location:  Scalp Procedure details:    Wound appearance:  Good wound healing and no signs of infection   Number of staples removed:  7 Post-procedure details:    Procedure completion:  Tolerated well, no immediate complications   ED Course and Medical Decision Making  I have reviewed the triage vital signs, the nursing notes, and pertinent available records from the EMR.  Social Determinants Affecting Complexity of Care: Patient has no clinically significant social determinants affecting this chief complaint..   ED Course:    Medical Decision Making Patient had an unwitnessed fall at a memory care facility.  She sustained a left facial laceration just lateral to the left eye.  This will need to be repaired with suture.  She also has an old laceration on the crown of her scalp with staples in place.  I will remove these old staples.  She is able to move all of her extremities, but complains of  mild pain in the left knee.  Will check imaging of this and CT head and cervical spine.  She is not anticoagulated..  Imaging results discussed below.  Amount and/or Complexity of Data Reviewed Labs: ordered.    Details: Labs are reassuring.  No significant electrolyte abnormality. Mild anemia, but similar to priors. Radiology: ordered.    Details: No acute traumatic findings on CT or imaging.  Patient able to stand without pain. ECG/medicine tests: ordered.  Risk Prescription drug management.         Consultants: No consultations were needed in caring for this patient.  Treatment and Plan: I considered admission due to patient's initial presentation, but after considering the examination and diagnostic results, patient will not require admission and can be discharged with outpatient follow-up.    Final Clinical Impressions(s) / ED Diagnoses     ICD-10-CM   1. Fall, initial encounter  W19.XXXA     2. Injury of head, initial encounter  S09.90XA     3. Facial laceration, initial encounter  Z61.09UE       ED Discharge Orders     None         Discharge Instructions Discussed with and Provided to Patient:   Discharge Instructions   None      Roxy Horseman, PA-C 06/05/23 0428    Nira Conn, MD 06/05/23 [phone]

## 2023-06-04 NOTE — ED Triage Notes (Signed)
Patient BIB [facility] from [facility] for evaluation of head injury s/p unwitnessed fall tonight.  Found on floor.  Unknown when fall occurred.  Hx of dementia.  No blood thinners.  Alert on arrival

## 2023-06-05 ENCOUNTER — Emergency Department (HOSPITAL_COMMUNITY): Payer: Medicare Other

## 2023-06-05 ENCOUNTER — Other Ambulatory Visit (HOSPITAL_COMMUNITY): Payer: Medicare Other

## 2023-06-05 DIAGNOSIS — S0990XA Unspecified injury of head, initial encounter: Secondary | ICD-10-CM | POA: Diagnosis not present

## 2023-06-05 LAB — BASIC METABOLIC PANEL
Anion gap: 10 (ref 5–15)
BUN: 23 mg/dL (ref 8–23)
CO2: 23 mmol/L (ref 22–32)
Calcium: 9 mg/dL (ref 8.9–10.3)
Chloride: 104 mmol/L (ref 98–111)
Creatinine, Ser: 0.82 mg/dL (ref 0.44–1.00)
GFR, Estimated: >60 mL/min (ref >60)
Glucose, Bld: 87 mg/dL (ref 70–99)
Potassium: 3.9 mmol/L (ref 3.5–5.1)
Sodium: 137 mmol/L (ref 135–145)

## 2023-06-05 LAB — CBC WITH DIFFERENTIAL/PLATELET
Abs Immature Granulocytes: 0.04 10*3/uL (ref 0.00–0.07)
Basophils Absolute: 0.1 10*3/uL (ref 0.0–0.1)
Basophils Relative: 1 %
Eosinophils Absolute: 0.3 10*3/uL (ref 0.0–0.5)
Eosinophils Relative: 3 %
HCT: 38.4 % (ref 36.0–46.0)
Hemoglobin: 11.9 g/dL — ABNORMAL LOW (ref 12.0–15.0)
Immature Granulocytes: 0 %
Lymphocytes Relative: 27 %
Lymphs Abs: 2.6 10*3/uL (ref 0.7–4.0)
MCH: 29.5 pg (ref 26.0–34.0)
MCHC: 31 g/dL (ref 30.0–36.0)
MCV: 95.3 fL (ref 80.0–100.0)
Monocytes Absolute: 0.9 10*3/uL (ref 0.1–1.0)
Monocytes Relative: 9 %
Neutro Abs: 5.8 10*3/uL (ref 1.7–7.7)
Neutrophils Relative %: 60 %
Platelets: 282 10*3/uL (ref 150–400)
RBC: 4.03 MIL/uL (ref 3.87–5.11)
RDW: 13 % (ref 11.5–15.5)
WBC: 9.6 10*3/uL (ref 4.0–10.5)
nRBC: 0 % (ref 0.0–0.2)

## 2023-06-05 MED ORDER — LIDOCAINE-EPINEPHRINE (PF) 2 %-1:200000 IJ SOLN
20.0000 mL | Freq: Once | INTRAMUSCULAR | Status: AC
  Administered 2023-06-05: 20 mL
  Filled 2023-06-05: qty 20

## 2023-06-05 MED ORDER — LIDOCAINE-EPINEPHRINE-TETRACAINE (LET) TOPICAL GEL
3.0000 mL | Freq: Once | TOPICAL | Status: AC
  Administered 2023-06-05: 3 mL via TOPICAL
  Filled 2023-06-05: qty 3

## 2023-06-05 NOTE — ED Notes (Signed)
[facility] called at this time to arrange transportation

## 2023-06-09 ENCOUNTER — Encounter (HOSPITAL_COMMUNITY): Payer: Self-pay | Admitting: *Deleted

## 2023-06-09 ENCOUNTER — Other Ambulatory Visit: Payer: Self-pay

## 2023-06-09 ENCOUNTER — Emergency Department (HOSPITAL_COMMUNITY)
Admission: EM | Admit: 2023-06-09 | Discharge: 2023-06-09 | Disposition: A | Discharge status: Skilled Nursing Facility | Payer: Medicare Other | Attending: Emergency Medicine | Admitting: Emergency Medicine

## 2023-06-09 ENCOUNTER — Emergency Department (HOSPITAL_COMMUNITY): Payer: Medicare Other

## 2023-06-09 DIAGNOSIS — M542 Cervicalgia: Secondary | ICD-10-CM | POA: Diagnosis not present

## 2023-06-09 DIAGNOSIS — R519 Headache, unspecified: Secondary | ICD-10-CM | POA: Diagnosis present

## 2023-06-09 DIAGNOSIS — S0083XA Contusion of other part of head, initial encounter: Secondary | ICD-10-CM | POA: Insufficient documentation

## 2023-06-09 DIAGNOSIS — W19XXXA Unspecified fall, initial encounter: Secondary | ICD-10-CM | POA: Insufficient documentation

## 2023-06-09 MED ORDER — ACETAMINOPHEN 500 MG PO TABS
1000.0000 mg | ORAL_TABLET | Freq: Once | ORAL | Status: AC
  Administered 2023-06-09: 1000 mg via ORAL
  Filled 2023-06-09: qty 2

## 2023-06-09 NOTE — ED Notes (Signed)
Called and left message on main line to give report.  Unable to contact staff.

## 2023-06-09 NOTE — ED Triage Notes (Signed)
Pt here via [facility] from [facility].  Hx of frequent falls.  Was found today sitting in the middle of the floor.  Hematoma with abrasion to R forehead.  Pt per norm; ao to self only.  No blood thinners on med rec.  Sutures noted to L temple from fall last week.  Denies cervical pain, though c/o L neck pain from fall 3 weeks prior.

## 2023-06-09 NOTE — ED Notes (Signed)
Attempted to call report.  Left message on main line.

## 2023-06-09 NOTE — ED Provider Notes (Signed)
[place] EMERGENCY DEPARTMENT AT [facility] Provider Note   CSN: [id number] Arrival date & time: 06/09/23  1441     History  Chief Complaint  Patient presents with   Fall    Jillian Harmon is a 73 y.o. female.  HPI Patient presents after a fall.  Patient has frequent falls including one last week resulting in left facial trauma, and stitches.  Today she notes that she fell again, striking right side of her head, no loss of consciousness.  She is accompanied by EMS individuals.  Those individuals note no hemodynamic instability in transport.  Patient has pain only in her head, neck, no lower extremity or torso pain.    Home Medications Prior to Admission medications   Medication Sig Start Date End Date Taking? Authorizing Provider  acetaminophen (TYLENOL) 500 MG tablet Take 1,000 mg by mouth every 8 (eight) hours as needed for moderate pain (pain score 4-6) or headache.    [provider]  dextromethorphan-guaiFENesin (ROBITUSSIN-DM) 10-100 MG/5ML liquid Take 10 mLs by mouth every 4 (four) hours as needed for cough.    [provider]  diazepam (VALIUM) 5 MG tablet Take 1 tablet (5 mg total) by mouth 3 (three) times daily. 04/20/23   Danford, Earl Lites, MD  divalproex (DEPAKOTE SPRINKLE) 125 MG capsule Take 1 capsule (125 mg total) by mouth 3 (three) times daily. 03/27/23 04/26/23  Earney Navy, NP  feeding supplement, ENSURE COMPLETE, (ENSURE COMPLETE) LIQD Take 237 mLs by mouth 2 (two) times daily.    [provider]  methimazole (TAPAZOLE) 5 MG tablet Take 5 mg by mouth 2 (two) times daily.    [provider]  mirtazapine (REMERON) 7.5 MG tablet Take 7.5 mg by mouth at bedtime.    [provider]  QUEtiapine (SEROQUEL) 25 MG tablet Take 0.5 tablets (12.5 mg total) by mouth every 12 (twelve) hours as needed (severe agitation). 03/27/23 04/26/23  Earney Navy, NP  QUEtiapine (SEROQUEL) 25 MG tablet  Take 12.5 mg by mouth 2 (two) times daily.    [provider]  simvastatin (ZOCOR) 40 MG tablet Take 40 mg by mouth daily.    [provider]  venlafaxine (EFFEXOR) 75 MG tablet Take 75 mg by mouth in the morning.    [provider]  venlafaxine XR (EFFEXOR-XR) 150 MG 24 hr capsule Take 150 mg by mouth in the morning.    [provider]      Allergies    Sertraline    Review of Systems   Review of Systems  Physical Exam Updated Vital Signs BP 133/80 (BP Location: Left Arm)   Pulse 77   Temp 98.4 F (36.9 C) (Oral)   Resp 18   Ht 5\' 1"  (1.549 m)   Wt 67.5 kg   SpO2 99%   BMI 28.12 kg/m  Physical Exam Vitals and nursing note reviewed.  Constitutional:      General: She is not in acute distress.    Appearance: She is well-developed.  HENT:     Head: Normocephalic.   Eyes:     Conjunctiva/sclera: Conjunctivae normal.  Cardiovascular:     Rate and Rhythm: Normal rate and regular rhythm.  Pulmonary:     Effort: Pulmonary effort is normal. No respiratory distress.     Breath sounds: Normal breath sounds. No stridor.  Abdominal:     General: There is no distension.  Musculoskeletal:     Cervical back: Neck supple. Muscular tenderness  present.  Skin:    General: Skin is warm and dry.  Neurological:     Mental Status: She is alert and oriented to person, place, and time.     Cranial Nerves: No cranial nerve deficit.     Motor: Atrophy present.  Psychiatric:        Mood and Affect: Mood normal.     ED Results / Procedures / Treatments   Labs (all labs ordered are listed, but only abnormal results are displayed) Labs Reviewed - No data to display  EKG None  Radiology CT Cervical Spine Wo Contrast Result Date: 06/09/2023 CLINICAL DATA:  Polytrauma, blunt EXAM: CT CERVICAL SPINE WITHOUT CONTRAST TECHNIQUE: Multidetector CT imaging of the cervical spine was performed without intravenous contrast. Multiplanar CT image reconstructions  were also generated. RADIATION DOSE REDUCTION: This exam was performed according to the departmental dose-optimization program which includes automated exposure control, adjustment of the mA and/or kV according to patient size and/or use of iterative reconstruction technique. COMPARISON:  06/05/2023 FINDINGS: Alignment: Facet joints are aligned without dislocation or traumatic listhesis. Dens and lateral masses are aligned. Skull base and vertebrae: No acute fracture. No primary bone lesion or focal pathologic process. Soft tissues and spinal canal: No prevertebral fluid or swelling. No visible canal hematoma. Disc levels:  Moderate multilevel cervical spondylosis, unchanged. Upper chest: Included lung apices are clear. Other: None. IMPRESSION: No acute fracture or traumatic listhesis of the cervical spine. Electronically Signed   By: Duanne Guess D.O.   On: 06/09/2023 16:12   CT Head Wo Contrast Result Date: 06/09/2023 CLINICAL DATA:  Polytrauma, blunt EXAM: CT HEAD WITHOUT CONTRAST TECHNIQUE: Contiguous axial images were obtained from the base of the skull through the vertex without intravenous contrast. RADIATION DOSE REDUCTION: This exam was performed according to the departmental dose-optimization program which includes automated exposure control, adjustment of the mA and/or kV according to patient size and/or use of iterative reconstruction technique. COMPARISON:  06/04/2023 FINDINGS: Brain: No evidence of acute infarction, hemorrhage, hydrocephalus, extra-axial collection or mass lesion/mass effect. Patchy low-density changes within the periventricular and subcortical white matter most compatible with chronic microvascular ischemic change. Moderate diffuse cerebral volume loss. Vascular: Atherosclerotic calcifications involving the large vessels of the skull base. No unexpected hyperdense vessel. Skull: Normal. Negative for fracture or focal lesion. Sinuses/Orbits: No acute finding. Other: Negative for  scalp hematoma. IMPRESSION: 1. No acute intracranial findings. 2. Chronic microvascular ischemic change and cerebral volume loss. Electronically Signed   By: Duanne Guess D.O.   On: 06/09/2023 16:11    Procedures Procedures    Medications Ordered in ED Medications  acetaminophen (TYLENOL) tablet 1,000 mg (has no administration in time range)    ED Course/ Medical Decision Making/ A&P                                 Medical Decision Making Elderly female with frequent falls presents after a fall.  She is awake, alert, sitting upright, moving all extremity spontaneously has symmetric strength in the upper or lower extremities, reassurance for low suspicion of intracranial or cord injury.  However, given the fall, trauma, obvious hematoma and her advanced age, CT scan, head, neck, ordered. Case discussed with EMS on arrival.  Amount and/or Complexity of Data Reviewed Independent Historian: EMS External Data Reviewed: notes.    Details: Notes from last week's fall eval reviewed Radiology: ordered and independent interpretation performed. Decision-making details documented in  ED Course.  Risk OTC drugs. Decision regarding hospitalization. Diagnosis or treatment significantly limited by social determinants of health.   4:50 PM Patient awake, alert, sitting upright, in no distress.  CT head, neck, reviewed, no acute findings.  Patient with generally reassuring evaluation here, including no hemodynamic instability, after hours of monitoring.  Patient appropriate for close outpatient follow-up with her physician.        Final Clinical Impression(s) / ED Diagnoses Final diagnoses:  Fall, initial encounter    Rx / DC Orders ED Discharge Orders     None         Gerhard Munch, MD 06/09/23 1650

## 2023-06-09 NOTE — Discharge Instructions (Signed)
 As discussed, your evaluation today has been largely reassuring.  But, it is important that you monitor your condition carefully, and do not hesitate to return to the ED if you develop new, or concerning changes in your condition. ? ?Otherwise, please follow-up with your physician for appropriate ongoing care. ? ?

## 2023-06-21 ENCOUNTER — Emergency Department (HOSPITAL_COMMUNITY): Payer: Medicare Other

## 2023-06-21 ENCOUNTER — Emergency Department (HOSPITAL_COMMUNITY)
Admission: EM | Admit: 2023-06-21 | Discharge: 2023-06-21 | Disposition: A | Discharge status: Home / Self Care | Payer: Medicare Other | Attending: Emergency Medicine | Admitting: Emergency Medicine

## 2023-06-21 ENCOUNTER — Encounter (HOSPITAL_COMMUNITY): Payer: Self-pay

## 2023-06-21 ENCOUNTER — Other Ambulatory Visit: Payer: Self-pay

## 2023-06-21 DIAGNOSIS — Z8673 Personal history of transient ischemic attack (TIA), and cerebral infarction without residual deficits: Secondary | ICD-10-CM | POA: Diagnosis not present

## 2023-06-21 DIAGNOSIS — W19XXXA Unspecified fall, initial encounter: Secondary | ICD-10-CM

## 2023-06-21 DIAGNOSIS — Y9364 Activity, baseball: Secondary | ICD-10-CM | POA: Diagnosis not present

## 2023-06-21 DIAGNOSIS — W01198A Fall on same level from slipping, tripping and stumbling with subsequent striking against other object, initial encounter: Secondary | ICD-10-CM | POA: Diagnosis not present

## 2023-06-21 DIAGNOSIS — Z79899 Other long term (current) drug therapy: Secondary | ICD-10-CM | POA: Diagnosis not present

## 2023-06-21 DIAGNOSIS — S0990XA Unspecified injury of head, initial encounter: Secondary | ICD-10-CM | POA: Insufficient documentation

## 2023-06-21 DIAGNOSIS — I10 Essential (primary) hypertension: Secondary | ICD-10-CM | POA: Insufficient documentation

## 2023-06-21 DIAGNOSIS — M25561 Pain in right knee: Secondary | ICD-10-CM | POA: Diagnosis not present

## 2023-06-21 DIAGNOSIS — F0390 Unspecified dementia without behavioral disturbance: Secondary | ICD-10-CM | POA: Diagnosis not present

## 2023-06-21 DIAGNOSIS — E039 Hypothyroidism, unspecified: Secondary | ICD-10-CM | POA: Diagnosis not present

## 2023-06-21 NOTE — Discharge Instructions (Signed)
The workup was reassuring.  The head CT does show potential swelling of the ventricles which appears to be chronic but needs to be followed.

## 2023-06-21 NOTE — ED Triage Notes (Signed)
Pt arrives via EMS from [facility] memory care, pt had unwitnessed fall from standing ht on concrete. No dizziness/N/V/ LOC. Pt with pupils equal reactive. Pt with swelling top left of head. Pt alert, appropriate to baseline. Follows commands VSS. No blood thinners. Pt ambulates with assistance or walker at baseline.

## 2023-06-21 NOTE — ED Notes (Signed)
Patient transported to x-ray. ?

## 2023-06-21 NOTE — ED Provider Notes (Signed)
[place] EMERGENCY DEPARTMENT AT [facility] Provider Note   CSN: [id number] Arrival date & time: 06/21/23  1055     History  Chief Complaint  Patient presents with   Fall    Jillian Harmon is a 73 y.o. female.   Fall  Patient presents after fall.  Comes from memory care.  Does have dementia.  Reportedly hit head.  Patient is without complaints except for mild pain on her right knee.  States it came from playing softball.    Past Medical History:  Diagnosis Date   Alcohol related seizure (HCC)    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   Alcohol-induced persisting dementia (HCC)    Anemia    Anxiety    Closed head injury 2015; early 2017   "fell in my house"   Delirium, withdrawal, alcoholic (HCC) 2012   Depression    Gastrointestinal hemorrhage associated with gastric ulcer    H/O ETOH abuse    "has not had anything to 2 months" (10/2015)   History of blood transfusion 10/18/2015   "anemia"   History of esophagogastroduodenoscopy (EGD) 07/2013   erosive gastritis, no varices ([facility])   History of gastric ulcer    History of subdural hematoma 07/2017   right subdural hematoma extending between the leaves of tentorium on the right side.   Hypertension    per Gerrit Heck at [facility]   Hypoglycemia    Hypothyroidism    Memory difficulty 11/19/2015   Memory loss    Seasonal allergies    Seizures (HCC) 11/19/2015   Stroke [facility] 2007   "dr's weren't sure if it was a stroke or seizure" (10/19/2015)   Subdural hematoma (HCC)    Syncope and collapse    Ventral hernia     Home Medications Prior to Admission medications   Medication Sig Start Date End Date Taking? Authorizing Provider  acetaminophen (TYLENOL) 500 MG tablet Take 1,000 mg by mouth every 8 (eight) hours as needed for moderate pain (pain score 4-6) or headache.   Yes [provider]  dextromethorphan-guaiFENesin (ROBITUSSIN-DM) 10-100 MG/5ML liquid Take 10  mLs by mouth every 4 (four) hours as needed for cough.   Yes [provider]  diazepam (VALIUM) 5 MG tablet Take 1 tablet (5 mg total) by mouth 3 (three) times daily. 04/20/23  Yes Danford, Earl Lites, MD  divalproex (DEPAKOTE) 250 MG DR tablet Take 250 mg by mouth 2 (two) times daily. 05/28/23  Yes [provider]  feeding supplement, ENSURE COMPLETE, (ENSURE COMPLETE) LIQD Take 237 mLs by mouth 2 (two) times daily.   Yes [provider]  levothyroxine (SYNTHROID) 25 MCG tablet Take 25 mcg by mouth daily.   Yes [provider]  loperamide (IMODIUM) 2 MG capsule Take 2-4 mg by mouth See admin instructions. Take 2 tablets by mouth after 1st loose stool,then 1 tablet after each additional loose stool for 5 days as needed(max 8 tablets in 24hours   Yes [provider]  methimazole (TAPAZOLE) 5 MG tablet Take 5 mg by mouth 2 (two) times daily.   Yes [provider]  mirtazapine (REMERON) 7.5 MG tablet Take 7.5 mg by mouth at bedtime.   Yes [provider]  ondansetron (ZOFRAN) 4 MG tablet Take 4 mg by mouth every 8 (eight) hours as needed for nausea or vomiting (FOR 5 DAYS).   Yes [provider]  QUEtiapine (SEROQUEL) 25 MG tablet Take 12.5 mg by mouth 2 (two) times daily.  Yes [provider]  simvastatin (ZOCOR) 40 MG tablet Take 40 mg by mouth daily.   Yes [provider]  venlafaxine (EFFEXOR) 75 MG tablet Take 75 mg by mouth in the morning.   Yes [provider]  venlafaxine XR (EFFEXOR-XR) 150 MG 24 hr capsule Take 150 mg by mouth in the morning.   Yes [provider]      Allergies    Sertraline    Review of Systems   Review of Systems  Physical Exam Updated Vital Signs BP 129/72 (BP Location: Left Arm)   Pulse 74   Temp 98.6 F (37 C) (Oral)   Resp 16   Ht 5\' 1"  (1.549 m)   Wt 65 kg   SpO2 98%   BMI 27.08 kg/m  Physical Exam Vitals reviewed.  HENT:     Head:      Comments: Old abrasion to forehead.  Otherwise no tenderness. Cardiovascular:     Rate and Rhythm: Normal rate.  Pulmonary:     Breath sounds: No wheezing.  Musculoskeletal:     Cervical back: Neck supple. No tenderness.     Comments: Mild tenderness to right knee without deformity.  Good range of motion.  Skin:    General: Skin is warm.  Neurological:     Mental Status: She is alert. Mental status is at baseline.     ED Results / Procedures / Treatments   Labs (all labs ordered are listed, but only abnormal results are displayed) Labs Reviewed - No data to display  EKG None  Radiology CT Head Wo Contrast Result Date: 06/21/2023 CLINICAL DATA:  Provided history: Head trauma, minor. EXAM: CT HEAD WITHOUT CONTRAST TECHNIQUE: Contiguous axial images were obtained from the base of the skull through the vertex without intravenous contrast. RADIATION DOSE REDUCTION: This exam was performed according to the departmental dose-optimization program which includes automated exposure control, adjustment of the mA and/or kV according to patient size and/or use of iterative reconstruction technique. COMPARISON:  Prior head CT examinations 06/09/2023 and earlier. FINDINGS: Brain: Cerebral atrophy. Moderate lateral and third ventriculomegaly which unchanged from the prior head CT of 06/09/2023. Some crowding of the sulci at the high frontoparietal lobes, an acute callosal angle, also noted. Patchy and ill-defined hypoattenuation within the cerebral white matter, nonspecific but compatible with moderate chronic small vessel ischemic disease. Unchanged chronic lacunar infarct within the right thalamus. Cavum septum pellucidum and cavum vergae (anatomic variant). There is no acute intracranial hemorrhage. No demarcated cortical infarct. No extra-axial fluid collection. No evidence of an intracranial mass. No midline shift. Vascular: No hyperdense vessel.  Atherosclerotic calcifications. Skull: No calvarial  fracture or aggressive osseous lesion. Sinuses/Orbits: No mass or acute finding within the imaged orbits. Postsurgical appearance of the paranasal sinuses. Mild mucosal thickening within the right maxillary sinus at the imaged levels. Minimal mucosal thickening within right ethmoid air cells/ethmoidectomy cavities. Minimal mucosal thickening within the bilateral frontal sinuses. IMPRESSION: 1.  No evidence of an acute intracranial abnormality. 2. Unchanged moderate lateral and third ventriculomegaly. This is at least partly due to cerebral atrophy. However there are also some imaging features whic or h can be seen in the setting of normal pressure hydrocephalus, and a component of NPH cannot be excluded. 3. Unchanged chronic lacunar infarct within the right thalamus. 4. Moderate cerebral white matter chronic small vessel ischemic disease. 5. Mild paranasal sinus disease as described. Electronically Signed   By: Jackey Loge D.O.   On: 06/21/2023 15:03  Procedures Procedures    Medications Ordered in ED Medications - No data to display  ED Course/ Medical Decision Making/ A&P                                 Medical Decision Making Amount and/or Complexity of Data Reviewed Radiology: ordered.   Patient reported mechanical fall.  Hit head.  Cannot provide much history.  Head CT reassuring.  Mild right knee pain with negative x-ray interpreted by me.  Had some enlarged ventricles on head CT.  Chronic but potentially could be NPH.  Will have follow-up PCP.  Will discharge home.        Final Clinical Impression(s) / ED Diagnoses Final diagnoses:  Fall, initial encounter    Rx / DC Orders ED Discharge Orders     None         Benjiman Core, MD 06/21/23 1521

## 2023-06-21 NOTE — ED Provider Triage Note (Signed)
Emergency Medicine Provider Triage Evaluation Note  Jillian Harmon , a 73 y.o. female  was evaluated in triage.  Pt complains of patient with reported fall from memory care.  Patient states she thinks she fell playing softball..    Physical Exam  BP 129/72 (BP Location: Left Arm)   Pulse 74   Temp 98.6 F (37 C) (Oral)   Resp 16   Ht 5\' 1"  (1.549 m)   Wt 65 kg   SpO2 98%   BMI 27.08 kg/m  Old abrasion on forehead.  No tenderness to forehead or cervical spine.  Patient claims tenderness to right knee, however no objective findings.  Medical Decision Making  Medically screening exam initiated at 12:37 PM.  Appropriate orders placed.  Jillian Harmon was informed that the remainder of the evaluation will be completed by another provider, this initial triage assessment does not replace that evaluation, and the importance of remaining in the ED until their evaluation is complete.  Patient fall.  Poorly hit head.  Will get head CT and right knee x-ray.  No other apparent injury.   Jillian Core, MD 06/21/23 [phone]

## 2023-07-07 ENCOUNTER — Emergency Department (HOSPITAL_COMMUNITY)
Admission: EM | Admit: 2023-07-07 | Discharge: 2023-07-07 | Disposition: A | Discharge status: Home / Self Care | Attending: Emergency Medicine | Admitting: Emergency Medicine

## 2023-07-07 DIAGNOSIS — Z79899 Other long term (current) drug therapy: Secondary | ICD-10-CM | POA: Diagnosis not present

## 2023-07-07 DIAGNOSIS — R451 Restlessness and agitation: Secondary | ICD-10-CM | POA: Insufficient documentation

## 2023-07-07 DIAGNOSIS — R300 Dysuria: Secondary | ICD-10-CM | POA: Diagnosis present

## 2023-07-07 LAB — COMPREHENSIVE METABOLIC PANEL
ALT: 16 U/L (ref 0–44)
AST: 23 U/L (ref 15–41)
Albumin: 3.8 g/dL (ref 3.5–5.0)
Alkaline Phosphatase: 55 U/L (ref 38–126)
Anion gap: 9 (ref 5–15)
BUN: 19 mg/dL (ref 8–23)
CO2: 26 mmol/L (ref 22–32)
Calcium: 9 mg/dL (ref 8.9–10.3)
Chloride: 105 mmol/L (ref 98–111)
Creatinine, Ser: 0.79 mg/dL (ref 0.44–1.00)
GFR, Estimated: >60 mL/min (ref >60)
Glucose, Bld: 84 mg/dL (ref 70–99)
Potassium: 4.5 mmol/L (ref 3.5–5.1)
Sodium: 140 mmol/L (ref 135–145)
Total Bilirubin: 0.4 mg/dL (ref 0.0–1.2)
Total Protein: 7.9 g/dL (ref 6.5–8.1)

## 2023-07-07 LAB — CBC
HCT: 39.1 % (ref 36.0–46.0)
Hemoglobin: 12.2 g/dL (ref 12.0–15.0)
MCH: 29.3 pg (ref 26.0–34.0)
MCHC: 31.2 g/dL (ref 30.0–36.0)
MCV: 94 fL (ref 80.0–100.0)
Platelets: 291 10*3/uL (ref 150–400)
RBC: 4.16 MIL/uL (ref 3.87–5.11)
RDW: 13.2 % (ref 11.5–15.5)
WBC: 8 10*3/uL (ref 4.0–10.5)
nRBC: 0 % (ref 0.0–0.2)

## 2023-07-07 LAB — URINALYSIS, ROUTINE W REFLEX MICROSCOPIC
Bacteria, UA: NONE SEEN
Bilirubin Urine: NEGATIVE
Glucose, UA: NEGATIVE mg/dL
Ketones, ur: NEGATIVE mg/dL
Leukocytes,Ua: NEGATIVE
Nitrite: NEGATIVE
Protein, ur: NEGATIVE mg/dL
Specific Gravity, Urine: 1.011 (ref 1.005–1.030)
pH: 7 (ref 5.0–8.0)

## 2023-07-07 LAB — LIPASE, BLOOD: Lipase: 35 U/L (ref 11–51)

## 2023-07-07 NOTE — ED Provider Notes (Signed)
 [place] EMERGENCY DEPARTMENT AT [facility] Provider Note   CSN: [id number] Arrival date & time: 07/07/23  1158     History  Chief Complaint  Patient presents with   Dysuria    Jillian Harmon is a 73 y.o. female with history of alcohol dependence, seizures, memory difficulty, dementia with behavioral disturbance.  Patient presents to ED for evaluation.  The patient apparently was agitated at her nursing home earlier today.  Per triage note, the patient had EMS called out due to being combative and uncooperative.  The patient apparently witnessed staff being rude to another patient" called mild-moderate".  EMS states no physical contact was made.  Patient has been alert to baseline since arrival in the ED.  She is calm and cooperative.  Staff mentions that the patient might have a UTI.  She is endorsing lower abdominal pain to me.   Dysuria      Home Medications Prior to Admission medications   Medication Sig Start Date End Date Taking? Authorizing Provider  acetaminophen (TYLENOL) 500 MG tablet Take 1,000 mg by mouth every 8 (eight) hours as needed for moderate pain (pain score 4-6) or headache.    [provider]  dextromethorphan-guaiFENesin (ROBITUSSIN-DM) 10-100 MG/5ML liquid Take 10 mLs by mouth every 4 (four) hours as needed for cough.    [provider]  diazepam (VALIUM) 5 MG tablet Take 1 tablet (5 mg total) by mouth 3 (three) times daily. 04/20/23   Danford, Earl Lites, MD  divalproex (DEPAKOTE) 250 MG DR tablet Take 250 mg by mouth 2 (two) times daily. 05/28/23   [provider]  feeding supplement, ENSURE COMPLETE, (ENSURE COMPLETE) LIQD Take 237 mLs by mouth 2 (two) times daily.    [provider]  levothyroxine (SYNTHROID) 25 MCG tablet Take 25 mcg by mouth daily.    [provider]  loperamide (IMODIUM) 2 MG capsule Take 2-4 mg by mouth See admin instructions. Take 2 tablets by mouth after 1st loose  stool,then 1 tablet after each additional loose stool for 5 days as needed(max 8 tablets in 24hours    [provider]  methimazole (TAPAZOLE) 5 MG tablet Take 5 mg by mouth 2 (two) times daily.    [provider]  mirtazapine (REMERON) 7.5 MG tablet Take 7.5 mg by mouth at bedtime.    [provider]  ondansetron (ZOFRAN) 4 MG tablet Take 4 mg by mouth every 8 (eight) hours as needed for nausea or vomiting (FOR 5 DAYS).    [provider]  QUEtiapine (SEROQUEL) 25 MG tablet Take 12.5 mg by mouth 2 (two) times daily.    [provider]  simvastatin (ZOCOR) 40 MG tablet Take 40 mg by mouth daily.    [provider]  venlafaxine (EFFEXOR) 75 MG tablet Take 75 mg by mouth in the morning.    [provider]  venlafaxine XR (EFFEXOR-XR) 150 MG 24 hr capsule Take 150 mg by mouth in the morning.    [provider]      Allergies    Sertraline    Review of Systems   Review of Systems  Unable to perform ROS: Dementia (Level 5 caveat)  All other systems reviewed and are negative.   Physical Exam Updated Vital Signs BP 123/69 (BP Location: Left Arm)   Pulse 71   Temp 98.5 F (36.9 C) (Oral)   Resp 16   SpO2 97%  Physical Exam Vitals and nursing note reviewed.  Constitutional:  General: She is not in acute distress.    Appearance: She is well-developed.  HENT:     Head: Normocephalic and atraumatic.  Eyes:     Conjunctiva/sclera: Conjunctivae normal.  Cardiovascular:     Rate and Rhythm: Normal rate and regular rhythm.     Heart sounds: No murmur heard. Pulmonary:     Effort: Pulmonary effort is normal. No respiratory distress.     Breath sounds: Normal breath sounds.  Abdominal:     Palpations: Abdomen is soft.     Tenderness: There is no abdominal tenderness.     Comments: No TTP of abdomen  Musculoskeletal:        General: No swelling.     Cervical back: Neck supple.  Skin:    General: Skin is warm  and dry.     Capillary Refill: Capillary refill takes less than 2 seconds.  Neurological:     Mental Status: She is alert. Mental status is at baseline.     Comments: Baseline mental status  Psychiatric:        Mood and Affect: Mood normal.     ED Results / Procedures / Treatments   Labs (all labs ordered are listed, but only abnormal results are displayed) Labs Reviewed  URINALYSIS, ROUTINE W REFLEX MICROSCOPIC - Abnormal; Notable for the following components:      Result Value   Hgb urine dipstick SMALL (*)    All other components within normal limits  CBC  COMPREHENSIVE METABOLIC PANEL  LIPASE, BLOOD    EKG None  Radiology No results found.  Procedures Procedures   Medications Ordered in ED Medications - No data to display  ED Course/ Medical Decision Making/ A&P  Medical Decision Making Amount and/or Complexity of Data Reviewed Labs: ordered.   73 year old female presents for evaluation.  Please see HPI for further details.  On examination the patient is afebrile and nontachycardic.  Her lung sounds are clear bilaterally, she is not hypoxic.  Abdomen soft and compressible throughout with no tenderness noted.  Alert to baseline.  Patient has been calm and cooperative here in the department.  No agitation noted.  Will collect labs and urinalysis as facility believes she might have UTI.  CBC without leukocytosis or anemia.  Metabolic panel without electrolyte derangement.  Lipase WNL.  Urinalysis shows small hemoglobin however no nitrites, leukocytes.  At this time patient has been calm and cooperative.  No indications for antibiotics related to UTI.  Will discharge patient back to facility at this time.   Final Clinical Impression(s) / ED Diagnoses Final diagnoses:  Agitation    Rx / DC Orders ED Discharge Orders     None         Al Decant, PA-C 07/07/23 1600    Benjiman Core, MD 07/08/23 [phone]

## 2023-07-07 NOTE — ED Notes (Signed)
 Called report to [facility], informed pt is up for discharge and will be returning soon with papers.

## 2023-07-07 NOTE — Discharge Instructions (Signed)
 It was a pleasure taking part in your care.  Your urinalysis showed no evidence of UTI.  Your lab work was also reassuring.  You have been calm and cooperative here in the department today.  We are sending you back to your facility.  Please follow-up with your PCP for further management and care.  Return to the ED with any new symptoms.

## 2023-07-07 NOTE — ED Triage Notes (Signed)
 PT arrives via EMS from [facility], EMS was called out for combative and uncooperative behavior. Pt states she witnessed staff being rude to another pt and called them out on it. EMS states there was no physical contact made, pt has been calm and cooperative since arrival. Pt is AXOX2, disoriented to year. Staff mentioned she might have a UTI, pt endorses mild dysuria.

## 2023-07-22 ENCOUNTER — Other Ambulatory Visit: Payer: Self-pay

## 2023-07-22 ENCOUNTER — Emergency Department (HOSPITAL_COMMUNITY)
Admission: EM | Admit: 2023-07-22 | Discharge: 2023-07-22 | Disposition: A | Discharge status: Other Acute Inpatient Hospital | Attending: Emergency Medicine | Admitting: Emergency Medicine

## 2023-07-22 ENCOUNTER — Encounter (HOSPITAL_COMMUNITY): Payer: Self-pay | Admitting: Emergency Medicine

## 2023-07-22 DIAGNOSIS — R4689 Other symptoms and signs involving appearance and behavior: Secondary | ICD-10-CM | POA: Insufficient documentation

## 2023-07-22 DIAGNOSIS — E039 Hypothyroidism, unspecified: Secondary | ICD-10-CM | POA: Diagnosis not present

## 2023-07-22 DIAGNOSIS — I10 Essential (primary) hypertension: Secondary | ICD-10-CM | POA: Insufficient documentation

## 2023-07-22 DIAGNOSIS — Z8673 Personal history of transient ischemic attack (TIA), and cerebral infarction without residual deficits: Secondary | ICD-10-CM | POA: Insufficient documentation

## 2023-07-22 DIAGNOSIS — Z79899 Other long term (current) drug therapy: Secondary | ICD-10-CM | POA: Insufficient documentation

## 2023-07-22 LAB — CBC WITH DIFFERENTIAL/PLATELET
Abs Immature Granulocytes: 0.02 10*3/uL (ref 0.00–0.07)
Basophils Absolute: 0 10*3/uL (ref 0.0–0.1)
Basophils Relative: 0 %
Eosinophils Absolute: 0.1 10*3/uL (ref 0.0–0.5)
Eosinophils Relative: 2 %
HCT: 41.5 % (ref 36.0–46.0)
Hemoglobin: 12.8 g/dL (ref 12.0–15.0)
Immature Granulocytes: 0 %
Lymphocytes Relative: 27 %
Lymphs Abs: 1.9 10*3/uL (ref 0.7–4.0)
MCH: 29.6 pg (ref 26.0–34.0)
MCHC: 30.8 g/dL (ref 30.0–36.0)
MCV: 95.8 fL (ref 80.0–100.0)
Monocytes Absolute: 0.6 10*3/uL (ref 0.1–1.0)
Monocytes Relative: 9 %
Neutro Abs: 4.3 10*3/uL (ref 1.7–7.7)
Neutrophils Relative %: 62 %
Platelets: 253 10*3/uL (ref 150–400)
RBC: 4.33 MIL/uL (ref 3.87–5.11)
RDW: 13.6 % (ref 11.5–15.5)
WBC: 7 10*3/uL (ref 4.0–10.5)
nRBC: 0 % (ref 0.0–0.2)

## 2023-07-22 LAB — URINALYSIS, W/ REFLEX TO CULTURE (INFECTION SUSPECTED)
Bacteria, UA: NONE SEEN
Bilirubin Urine: NEGATIVE
Glucose, UA: NEGATIVE mg/dL
Ketones, ur: NEGATIVE mg/dL
Leukocytes,Ua: NEGATIVE
Nitrite: NEGATIVE
Protein, ur: NEGATIVE mg/dL
Specific Gravity, Urine: 1.013 (ref 1.005–1.030)
pH: 6 (ref 5.0–8.0)

## 2023-07-22 LAB — COMPREHENSIVE METABOLIC PANEL
ALT: 21 U/L (ref 0–44)
AST: 25 U/L (ref 15–41)
Albumin: 3.5 g/dL (ref 3.5–5.0)
Alkaline Phosphatase: 47 U/L (ref 38–126)
Anion gap: 10 (ref 5–15)
BUN: 17 mg/dL (ref 8–23)
CO2: 18 mmol/L — ABNORMAL LOW (ref 22–32)
Calcium: 8.4 mg/dL — ABNORMAL LOW (ref 8.9–10.3)
Chloride: 114 mmol/L — ABNORMAL HIGH (ref 98–111)
Creatinine, Ser: 0.91 mg/dL (ref 0.44–1.00)
GFR, Estimated: >60 mL/min (ref >60)
Glucose, Bld: 88 mg/dL (ref 70–99)
Potassium: 4 mmol/L (ref 3.5–5.1)
Sodium: 142 mmol/L (ref 135–145)
Total Bilirubin: 0.4 mg/dL (ref 0.0–1.2)
Total Protein: 7.2 g/dL (ref 6.5–8.1)

## 2023-07-22 LAB — LIPASE, BLOOD: Lipase: 24 U/L (ref 11–51)

## 2023-07-22 NOTE — ED Provider Notes (Signed)
 [place] [facility] DEPARTMENT AT [facility] Provider Note   CSN: [id number] Arrival date & time: 07/22/23  0831     History  Chief Complaint  Patient presents with   Aggressive Behavior    Jillian Harmon is a 73 y.o. female with PMHx alcohol use disorder, stroke, dementia, anemia, anxiety, depression, HTN, hypothyroidism, who presents to ED concerned for aggression.  Talked with patient's nurse at [facility] who stated that patient was being combative this morning so they sent her to ED for UTI screening.  Patient stating that she thinks she made the timid principal upset at the nursing facility. Patient also endorsing a mechanical fall - landing on her bottom - 1 week ago. Patient stating that she is able to walk without pain. Denies head trauma, LOC, seizures. Patient's nurse denying recent fall by patient. Patient Denies fever, chest pain, dyspnea, cough, nausea, vomiting, diarrhea, dysuria, hematuria, hematochezia. Nursing also denying any recent infectious symptoms from the patient. Patient does endorse mild central abdominal "soreness" that has been chronic for her.   HPI     Home Medications Prior to Admission medications   Medication Sig Start Date End Date Taking? Authorizing Provider  acetaminophen (TYLENOL) 500 MG tablet Take 1,000 mg by mouth every 8 (eight) hours as needed for moderate pain (pain score 4-6) or headache.    [provider]  dextromethorphan-guaiFENesin (ROBITUSSIN-DM) 10-100 MG/5ML liquid Take 10 mLs by mouth every 4 (four) hours as needed for cough.    [provider]  diazepam (VALIUM) 5 MG tablet Take 1 tablet (5 mg total) by mouth 3 (three) times daily. 04/20/23   Danford, Earl Lites, MD  divalproex (DEPAKOTE) 250 MG DR tablet Take 250 mg by mouth 2 (two) times daily. 05/28/23   [provider]  feeding supplement, ENSURE COMPLETE, (ENSURE COMPLETE) LIQD Take 237 mLs by mouth 2 (two) times daily.     [provider]  levothyroxine (SYNTHROID) 25 MCG tablet Take 25 mcg by mouth daily.    [provider]  loperamide (IMODIUM) 2 MG capsule Take 2-4 mg by mouth See admin instructions. Take 2 tablets by mouth after 1st loose stool,then 1 tablet after each additional loose stool for 5 days as needed(max 8 tablets in 24hours    [provider]  methimazole (TAPAZOLE) 5 MG tablet Take 5 mg by mouth 2 (two) times daily.    [provider]  mirtazapine (REMERON) 7.5 MG tablet Take 7.5 mg by mouth at bedtime.    [provider]  ondansetron (ZOFRAN) 4 MG tablet Take 4 mg by mouth every 8 (eight) hours as needed for nausea or vomiting (FOR 5 DAYS).    [provider]  QUEtiapine (SEROQUEL) 25 MG tablet Take 12.5 mg by mouth 2 (two) times daily.    [provider]  simvastatin (ZOCOR) 40 MG tablet Take 40 mg by mouth daily.    [provider]  venlafaxine (EFFEXOR) 75 MG tablet Take 75 mg by mouth in the morning.    [provider]  venlafaxine XR (EFFEXOR-XR) 150 MG 24 hr capsule Take 150 mg by mouth in the morning.    [provider]      Allergies    Sertraline    Review of Systems   Review of Systems  Psychiatric/Behavioral:  Positive for agitation.     Physical Exam Updated Vital Signs BP 128/75   Pulse 76   Temp (!) 97.4 F (36.3 C) (Oral)  Resp 14   SpO2 97%  Physical Exam Vitals and nursing note reviewed.  Constitutional:      General: She is not in acute distress.    Appearance: She is not ill-appearing or toxic-appearing.  HENT:     Head: Normocephalic and atraumatic.     Mouth/Throat:     Mouth: Mucous membranes are moist.     Pharynx: No oropharyngeal exudate or posterior oropharyngeal erythema.  Eyes:     General: No scleral icterus.       Right eye: No discharge.        Left eye: No discharge.     Conjunctiva/sclera: Conjunctivae normal.  Cardiovascular:     Rate and Rhythm:  Normal rate and regular rhythm.     Pulses: Normal pulses.     Heart sounds: Normal heart sounds. No murmur heard. Pulmonary:     Effort: Pulmonary effort is normal. No respiratory distress.     Breath sounds: Normal breath sounds. No wheezing, rhonchi or rales.  Abdominal:     General: Abdomen is flat. Bowel sounds are normal. There is no distension.     Palpations: Abdomen is soft. There is no mass.     Comments: Mild "soreness" to central abdominal area  Musculoskeletal:     Right lower leg: No edema.     Left lower leg: No edema.     Comments: No tenderness to palpation of BL ankles, knees, hips, shoulders, elbows, wrists.  Skin:    General: Skin is warm and dry.     Findings: No rash.  Neurological:     General: No focal deficit present.     Mental Status: She is alert. Mental status is at baseline.     Comments: AAOx2 at baseline.  GCS 15. Speech is goal oriented. No deficits appreciated to CN III-XII;  Patient moves extremities without ataxia. Patient ambulatory with steady gait and no pain.   Psychiatric:        Mood and Affect: Mood normal.     ED Results / Procedures / Treatments   Labs (all labs ordered are listed, but only abnormal results are displayed) Labs Reviewed  COMPREHENSIVE METABOLIC PANEL - Abnormal; Notable for the following components:      Result Value   Chloride 114 (*)    CO2 18 (*)    Calcium 8.4 (*)    All other components within normal limits  URINALYSIS, W/ REFLEX TO CULTURE (INFECTION SUSPECTED) - Abnormal; Notable for the following components:   Hgb urine dipstick LARGE (*)    All other components within normal limits  CBC WITH DIFFERENTIAL/PLATELET  LIPASE, BLOOD    EKG None  Radiology No results found.  Procedures Procedures    Medications Ordered in ED Medications - No data to display  ED Course/ Medical Decision Making/ A&P                                 Medical Decision Making Amount and/or Complexity of Data  Reviewed Labs: ordered.   This patient presents to the ED for concern of AMS, this involves an extensive number of treatment options, and is a complaint that carries with it a high risk of complications and morbidity.  The differential diagnosis includes CVA, ICH, intracranial mass, critical dehydration, heptatic dysfunction, uremia, hypercarbia, intoxication/withdrawal, endocrine abnormality, sepsis/infection.   Co morbidities that complicate the patient evaluation  alcohol use disorder, stroke, dementia, anemia, anxiety,  depression, HTN, hypothyroidism   Additional history obtained:  Dr. Cori Razor PCP    Problem List / ED Course / Critical interventions / Medication management  Patient presented for agitated behavior. Patient frequents the ED with this complaint. Patient no longer agitated in ED - patient very pleasantly demented at baseline.  Nursing staff and patient denies any recent infectious symptoms. Patient endorses falling on her bottom last week, but nursing staff denies this. Patient without pain and is able to ambulate in ED. Physical exam with mild "soreness" of the center of her abdomen - but no bruising, skin changes, swelling, masses appreciated. Patient stating that this is a chronic complaint for her.  Rest of physical exam reassuring.  Patient afebrile with stable vitals. I Ordered, and personally interpreted labs.  Lipase within normal limits.  CBC without leukocytosis or anemia.  CMP reassuring. UA not concerning for infection. Shared results with patient. Answered all questions. Patient asking to go home which I believe is appropriate at this time. Recommended following up with PCP. Patient verbalized understanding of plan. Staffed with Dr. Renaye Rakers who agrees with plan. I have reviewed the patients home medicines and have made adjustments as needed Patient was given return precautions. Patient stable for discharge at this time. Patient verbalized understanding of  plan.   DDx: These were considered less likely due to history of present illness and physical exam findings CVA/ICH/intracranial mass: CT without concern critical dehydration/heptatic dysfunction/Uremia: CMP without concern Hypercarbia/Intoxication/withdrawal: patient history inconsistent with this diagnosis  Sepsis: patient afebrile with stable vitals   Social Determinants of Health:  dementia           Final Clinical Impression(s) / ED Diagnoses Final diagnoses:  Aggressive behavior    Rx / DC Orders ED Discharge Orders     None         Dorthy Cooler, [place] 07/22/23 1112    Terald Sleeper, MD 07/22/23 [phone]

## 2023-07-22 NOTE — ED Notes (Signed)
 Urine sample contaminated with stool, will I&O.

## 2023-07-22 NOTE — ED Triage Notes (Signed)
 Pt Bib [facility] from [facility] with reports of pt being aggressive towards staff. 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 Pt has hx of dementia.

## 2023-07-22 NOTE — ED Notes (Signed)
 PTAR>Rich[place] Erroll Luna

## 2023-07-22 NOTE — ED Notes (Signed)
Pt up to [facility], attempting urine sample

## 2023-07-22 NOTE — Discharge Instructions (Signed)
 It was a pleasure caring for you today.  Workup today was reassuring.  Please follow-up with your primary care provider.  Seek emergency care experiencing any new or worsening symptoms.

## 2023-08-17 ENCOUNTER — Emergency Department (HOSPITAL_COMMUNITY)
Admission: EM | Admit: 2023-08-17 | Discharge: 2023-08-18 | Disposition: A | Discharge status: Home / Self Care | Attending: Emergency Medicine | Admitting: Emergency Medicine

## 2023-08-17 ENCOUNTER — Emergency Department (HOSPITAL_COMMUNITY)

## 2023-08-17 ENCOUNTER — Other Ambulatory Visit: Payer: Self-pay

## 2023-08-17 ENCOUNTER — Encounter (HOSPITAL_COMMUNITY): Payer: Self-pay

## 2023-08-17 DIAGNOSIS — N3001 Acute cystitis with hematuria: Secondary | ICD-10-CM | POA: Diagnosis not present

## 2023-08-17 DIAGNOSIS — Z23 Encounter for immunization: Secondary | ICD-10-CM | POA: Diagnosis not present

## 2023-08-17 DIAGNOSIS — S0101XA Laceration without foreign body of scalp, initial encounter: Secondary | ICD-10-CM | POA: Insufficient documentation

## 2023-08-17 DIAGNOSIS — S70311A Abrasion, right thigh, initial encounter: Secondary | ICD-10-CM | POA: Diagnosis not present

## 2023-08-17 DIAGNOSIS — W19XXXA Unspecified fall, initial encounter: Secondary | ICD-10-CM

## 2023-08-17 DIAGNOSIS — W010XXA Fall on same level from slipping, tripping and stumbling without subsequent striking against object, initial encounter: Secondary | ICD-10-CM | POA: Insufficient documentation

## 2023-08-17 DIAGNOSIS — S32000A Wedge compression fracture of unspecified lumbar vertebra, initial encounter for closed fracture: Secondary | ICD-10-CM | POA: Diagnosis not present

## 2023-08-17 DIAGNOSIS — S0990XA Unspecified injury of head, initial encounter: Secondary | ICD-10-CM | POA: Diagnosis present

## 2023-08-17 LAB — CBC WITH DIFFERENTIAL/PLATELET
Abs Immature Granulocytes: 0.02 10*3/uL (ref 0.00–0.07)
Basophils Absolute: 0.1 10*3/uL (ref 0.0–0.1)
Basophils Relative: 1 %
Eosinophils Absolute: 0.2 10*3/uL (ref 0.0–0.5)
Eosinophils Relative: 3 %
HCT: 37.7 % (ref 36.0–46.0)
Hemoglobin: 12 g/dL (ref 12.0–15.0)
Immature Granulocytes: 0 %
Lymphocytes Relative: 35 %
Lymphs Abs: 2.4 10*3/uL (ref 0.7–4.0)
MCH: 29.6 pg (ref 26.0–34.0)
MCHC: 31.8 g/dL (ref 30.0–36.0)
MCV: 93.1 fL (ref 80.0–100.0)
Monocytes Absolute: 0.5 10*3/uL (ref 0.1–1.0)
Monocytes Relative: 7 %
Neutro Abs: 3.7 10*3/uL (ref 1.7–7.7)
Neutrophils Relative %: 54 %
Platelets: 229 10*3/uL (ref 150–400)
RBC: 4.05 MIL/uL (ref 3.87–5.11)
RDW: 14.1 % (ref 11.5–15.5)
WBC: 6.8 10*3/uL (ref 4.0–10.5)
nRBC: 0 % (ref 0.0–0.2)

## 2023-08-17 LAB — URINALYSIS, ROUTINE W REFLEX MICROSCOPIC
Bilirubin Urine: NEGATIVE
Glucose, UA: NEGATIVE mg/dL
Ketones, ur: NEGATIVE mg/dL
Nitrite: NEGATIVE
Protein, ur: NEGATIVE mg/dL
Specific Gravity, Urine: 1.013 (ref 1.005–1.030)
pH: 7 (ref 5.0–8.0)

## 2023-08-17 LAB — COMPREHENSIVE METABOLIC PANEL WITH GFR
ALT: 17 U/L (ref 0–44)
AST: 22 U/L (ref 15–41)
Albumin: 3.5 g/dL (ref 3.5–5.0)
Alkaline Phosphatase: 44 U/L (ref 38–126)
Anion gap: 10 (ref 5–15)
BUN: 20 mg/dL (ref 8–23)
CO2: 24 mmol/L (ref 22–32)
Calcium: 9 mg/dL (ref 8.9–10.3)
Chloride: 105 mmol/L (ref 98–111)
Creatinine, Ser: 0.9 mg/dL (ref 0.44–1.00)
GFR, Estimated: >60 mL/min (ref >60)
Glucose, Bld: 83 mg/dL (ref 70–99)
Potassium: 4.2 mmol/L (ref 3.5–5.1)
Sodium: 139 mmol/L (ref 135–145)
Total Bilirubin: 0.5 mg/dL (ref 0.0–1.2)
Total Protein: 7 g/dL (ref 6.5–8.1)

## 2023-08-17 MED ORDER — CEPHALEXIN 500 MG PO CAPS: 500.0000 mg | ORAL_CAPSULE | Freq: Four times a day (QID) | ORAL | 0 refills | Status: AC

## 2023-08-17 MED ORDER — CEPHALEXIN 250 MG PO CAPS
500.0000 mg | ORAL_CAPSULE | Freq: Once | ORAL | Status: AC
  Administered 2023-08-17: 500 mg via ORAL
  Filled 2023-08-17: qty 2

## 2023-08-17 MED ORDER — LIDOCAINE-EPINEPHRINE (PF) 2 %-1:200000 IJ SOLN
20.0000 mL | Freq: Once | INTRAMUSCULAR | Status: AC
Start: 2023-08-17 — End: 2023-08-17
  Administered 2023-08-17: 20 mL
  Filled 2023-08-17: qty 20

## 2023-08-17 MED ORDER — TETANUS-DIPHTH-ACELL PERTUSSIS 5-2.5-18.5 LF-MCG/0.5 IM SUSY
0.5000 mL | PREFILLED_SYRINGE | Freq: Once | INTRAMUSCULAR | Status: AC
  Administered 2023-08-17: 0.5 mL via INTRAMUSCULAR
  Filled 2023-08-17: qty 0.5

## 2023-08-17 MED ORDER — ACETAMINOPHEN 325 MG PO TABS
650.0000 mg | ORAL_TABLET | Freq: Once | ORAL | Status: AC
  Administered 2023-08-17: 650 mg via ORAL
  Filled 2023-08-17: qty 2

## 2023-08-17 NOTE — Discharge Instructions (Addendum)
 Stitches need to be removed in 5 days. Leave the dressing on your laceration for the next 24 hours. After that, you can leave the wound open to air. After the first 24 hours, you can begin to clean the wound site with soap and water to prevent crusting over the suture knots.  Do not go into pools, lakes, or oceans until the sutures are removed in order to prevent infection.  Your imaging showed a fracture to L2, this could be from a recent fall or from the fall today, otherwise your imaging is reassuring.  Your urine just show signs of urinary tract infection.  Take Keflex 4 times a day for the next week.  Your urine has been sent for culture.  You will receive a call from the hospital if the medication you are on is not susceptible to the antibiotic you are taking.     Follow-up with orthopedics for further evaluation of the spinal fracture.  Follow-up with primary care for reevaluation after fall in the next several days.  Paper prescription for Keflex given.  Get help right away if you: Lose consciousness or have trouble moving after a fall. Have a fall that causes a head injury.

## 2023-08-17 NOTE — Progress Notes (Signed)
 Orthopedic Tech Progress Note Patient Details:  Jillian Harmon [facility] May 20, 1950 [id number]  Ortho Devices Type of Ortho Device: Thoracolumbar corset (TLSO) Ortho Device/Splint Interventions: Ordered, Adjustment     TLSO adjusted and left at bedside for OOB wear. Darleen Crocker 08/17/2023, 11:23 PM

## 2023-08-17 NOTE — ED Provider Notes (Signed)
 [place] EMERGENCY DEPARTMENT AT [facility] Provider Note   CSN: [id number] Arrival date & time: 08/17/23  1601     History  Chief Complaint  Patient presents with   Fall    Jillian Harmon is a 73 y.o. female with a history of seizures, stroke, and memory loss who presents to the ED today via EMS after a fall.  Patient reports that she was sitting in a chair and was going to get up when she tripped over her pant leg and fell forward.  States that her glasses at the side of her face, causing a small laceration above the left lateral eyebrow.  Some tenderness to her cheek but denies any vision changes, weakness, or loss of sensation.  Patient did not lose consciousness.  She is not on any blood thinners.  Additionally she endorses lower left back pain as well.  No additional complaints or concerns at this time.    Home Medications Prior to Admission medications   Medication Sig Start Date End Date Taking? Authorizing Provider  cephALEXin (KEFLEX) 500 MG capsule Take 1 capsule (500 mg total) by mouth 4 (four) times daily for 7 days. 08/17/23 08/24/23 Yes Sonnie Dusky, PA-C  acetaminophen (TYLENOL) 500 MG tablet Take 1,000 mg by mouth every 8 (eight) hours as needed for moderate pain (pain score 4-6) or headache.    [provider]  dextromethorphan-guaiFENesin (ROBITUSSIN-DM) 10-100 MG/5ML liquid Take 10 mLs by mouth every 4 (four) hours as needed for cough.    [provider]  diazepam (VALIUM) 5 MG tablet Take 1 tablet (5 mg total) by mouth 3 (three) times daily. 04/20/23   Danford, Willis Harter, MD  divalproex (DEPAKOTE) 250 MG DR tablet Take 250 mg by mouth 2 (two) times daily. 05/28/23   [provider]  feeding supplement, ENSURE COMPLETE, (ENSURE COMPLETE) LIQD Take 237 mLs by mouth 2 (two) times daily.    [provider]  levothyroxine (SYNTHROID) 25 MCG tablet Take 25 mcg by mouth daily.    [provider]   loperamide (IMODIUM) 2 MG capsule Take 2-4 mg by mouth See admin instructions. Take 2 tablets by mouth after 1st loose stool,then 1 tablet after each additional loose stool for 5 days as needed(max 8 tablets in 24hours    [provider]  methimazole (TAPAZOLE) 5 MG tablet Take 5 mg by mouth 2 (two) times daily.    [provider]  mirtazapine (REMERON) 7.5 MG tablet Take 7.5 mg by mouth at bedtime.    [provider]  ondansetron (ZOFRAN) 4 MG tablet Take 4 mg by mouth every 8 (eight) hours as needed for nausea or vomiting (FOR 5 DAYS).    [provider]  QUEtiapine (SEROQUEL) 25 MG tablet Take 12.5 mg by mouth 2 (two) times daily.    [provider]  simvastatin (ZOCOR) 40 MG tablet Take 40 mg by mouth daily.    [provider]  venlafaxine (EFFEXOR) 75 MG tablet Take 75 mg by mouth in the morning.    [provider]  venlafaxine XR (EFFEXOR-XR) 150 MG 24 hr capsule Take 150 mg by mouth in the morning.    [provider]      Allergies    Sertraline    Review of Systems   Review of Systems  Constitutional:        Fall  All other systems reviewed and are negative.   Physical Exam Updated Vital Signs BP (!) 147/85  Pulse 70   Temp 98.1 F (36.7 C) (Oral)   Resp (!) 23   SpO2 100%  Physical Exam Vitals and nursing note reviewed.  Constitutional:      General: She is not in acute distress.    Appearance: Normal appearance.  HENT:     Head: Normocephalic.     Comments: 1 cm laceration above the left lateral eyebrow. Bleeding controlled.    Nose: Nose normal.     Mouth/Throat:     Mouth: Mucous membranes are moist.  Eyes:     Conjunctiva/sclera: Conjunctivae normal.     Pupils: Pupils are equal, round, and reactive to light.  Neck:     Comments: No midline or paravertebral tenderness to palpation of the cervical spine. Cardiovascular:     Rate and Rhythm: Normal rate and regular rhythm.     Pulses:  Normal pulses.     Heart sounds: Normal heart sounds.  Pulmonary:     Effort: Pulmonary effort is normal.     Breath sounds: Normal breath sounds.  Abdominal:     Palpations: Abdomen is soft.     Tenderness: There is no abdominal tenderness.  Musculoskeletal:        General: Tenderness present. Normal range of motion.     Cervical back: Normal range of motion. No tenderness.  Skin:    General: Skin is warm and dry.     Findings: No rash.     Comments: Abrasion with erythema to the right medial thigh  Neurological:     General: No focal deficit present.     Mental Status: She is alert. Mental status is at baseline.     Sensory: No sensory deficit.     Motor: No weakness.  Psychiatric:        Mood and Affect: Mood normal.        Behavior: Behavior normal.    ED Results / Procedures / Treatments   Labs (all labs ordered are listed, but only abnormal results are displayed) Labs Reviewed  URINALYSIS, ROUTINE W REFLEX MICROSCOPIC - Abnormal; Notable for the following components:      Result Value   Hgb urine dipstick MODERATE (*)    Leukocytes,Ua TRACE (*)    Bacteria, UA RARE (*)    All other components within normal limits  URINE CULTURE  COMPREHENSIVE METABOLIC PANEL WITH GFR  CBC WITH DIFFERENTIAL/PLATELET    EKG None  Radiology CT Head Wo Contrast Result Date: 08/17/2023 CLINICAL DATA:  Trauma, witnessed mechanical fall, laceration to left forehead. EXAM: CT HEAD WITHOUT CONTRAST CT MAXILLOFACIAL WITHOUT CONTRAST CT CERVICAL SPINE WITHOUT CONTRAST TECHNIQUE: Multidetector CT imaging of the head, cervical spine, and maxillofacial structures were performed using the standard protocol without intravenous contrast. Multiplanar CT image reconstructions of the cervical spine and maxillofacial structures were also generated. RADIATION DOSE REDUCTION: This exam was performed according to the departmental dose-optimization program which includes automated exposure control,  adjustment of the mA and/or kV according to patient size and/or use of iterative reconstruction technique. COMPARISON:  CT head 06/21/2023, CT cervical spine 06/09/2023. FINDINGS: CT HEAD FINDINGS Brain: No acute intracranial hemorrhage. No CT evidence of acute infarct. Nonspecific hypoattenuation in the periventricular and subcortical white matter favored to reflect chronic microvascular ischemic changes. Remote lacunar infarcts in the right thalamus and bilateral basal ganglia as well as the left subinsular region. No edema, mass effect, or midline shift. The basilar cisterns are patent. Ventricles: Similar moderate ventriculomegaly which is slightly out of proportion to  the degree of sulcal enlargement. Slight crowding of sulci near the vertex and acute callosal angle noted. Vascular: Atherosclerotic calcifications of the carotid siphons. No hyperdense vessel. Skull: No acute or aggressive finding. Other: Mastoid air cells are clear. CT MAXILLOFACIAL FINDINGS Osseous: Maxilla: Intact Pterygoid Plates: Intact Zygomatic Arch: Intact Orbits: Intact Ethmoid: Intact Sphenoid: Intact Frontal:Intact Mandible: Intact. No condylar dislocation. Nasal: Chronic appearing nasal bone deformities are similar to prior. No overlying soft tissue swelling. Nasal Septum: Intact.  Leftward deviation of the nasal septum. Orbits: Globes are intact. Lenses are normally located. The extraocular muscles and optic nerve sheath complexes are unremarkable. No retrobulbar hematoma. Sinuses: Mild scattered mucosal thickening in the bilateral ethmoid sinuses and in the lateral right frontal sinus. Postsurgical changes of bilateral antrostomy/uncinectomy. Mild mucosal thickening in the alveolar recesses of both maxillary sinuses. No air-fluid levels. Soft tissues: Focal irregularity of the skin over the lateral left forehead with soft tissue swelling extending into the soft tissues along the lateral aspect of the left orbit and left facial soft  tissues. Additional stranding in the facial soft tissues overlying the left masseter muscle. CT CERVICAL SPINE FINDINGS Alignment: Alignment is maintained. No listhesis. No facet subluxation or dislocation. Skull base and vertebrae: No compression fracture or displaced fracture in the cervical spine. No suspicious osseous lesion. Soft tissues and spinal canal: No prevertebral fluid or swelling. No visible canal hematoma. Disc levels: Intervertebral disc space narrowing at multiple levels throughout the cervical spine. Small disc bulges at multiple levels. No high-grade osseous spinal canal stenosis. Facet arthrosis and uncovertebral hypertrophy throughout the cervical spine. Foraminal narrowing is most pronounced on the right at C4-5. Upper chest: Negative. Other: None. IMPRESSION: No CT evidence of acute intracranial abnormality. No acute maxillofacial fracture. No acute fracture or traumatic malalignment of the cervical spine. Laceration over the lateral left forehead with adjacent soft tissue swelling extending into the left periorbital soft tissues. Additional mild left facial soft tissue swelling. Similar ventriculomegaly which may reflect parenchymal volume loss. Normal pressure hydrocephalus is an additional consideration in the appropriate clinical context. Chronic and degenerative changes as above. Electronically Signed   By: Denny Flack M.D.   On: 08/17/2023 19:10   CT Maxillofacial Wo Contrast Result Date: 08/17/2023 CLINICAL DATA:  Trauma, witnessed mechanical fall, laceration to left forehead. EXAM: CT HEAD WITHOUT CONTRAST CT MAXILLOFACIAL WITHOUT CONTRAST CT CERVICAL SPINE WITHOUT CONTRAST TECHNIQUE: Multidetector CT imaging of the head, cervical spine, and maxillofacial structures were performed using the standard protocol without intravenous contrast. Multiplanar CT image reconstructions of the cervical spine and maxillofacial structures were also generated. RADIATION DOSE REDUCTION: This exam  was performed according to the departmental dose-optimization program which includes automated exposure control, adjustment of the mA and/or kV according to patient size and/or use of iterative reconstruction technique. COMPARISON:  CT head 06/21/2023, CT cervical spine 06/09/2023. FINDINGS: CT HEAD FINDINGS Brain: No acute intracranial hemorrhage. No CT evidence of acute infarct. Nonspecific hypoattenuation in the periventricular and subcortical white matter favored to reflect chronic microvascular ischemic changes. Remote lacunar infarcts in the right thalamus and bilateral basal ganglia as well as the left subinsular region. No edema, mass effect, or midline shift. The basilar cisterns are patent. Ventricles: Similar moderate ventriculomegaly which is slightly out of proportion to the degree of sulcal enlargement. Slight crowding of sulci near the vertex and acute callosal angle noted. Vascular: Atherosclerotic calcifications of the carotid siphons. No hyperdense vessel. Skull: No acute or aggressive finding. Other: Mastoid air cells are clear. CT  MAXILLOFACIAL FINDINGS Osseous: Maxilla: Intact Pterygoid Plates: Intact Zygomatic Arch: Intact Orbits: Intact Ethmoid: Intact Sphenoid: Intact Frontal:Intact Mandible: Intact. No condylar dislocation. Nasal: Chronic appearing nasal bone deformities are similar to prior. No overlying soft tissue swelling. Nasal Septum: Intact.  Leftward deviation of the nasal septum. Orbits: Globes are intact. Lenses are normally located. The extraocular muscles and optic nerve sheath complexes are unremarkable. No retrobulbar hematoma. Sinuses: Mild scattered mucosal thickening in the bilateral ethmoid sinuses and in the lateral right frontal sinus. Postsurgical changes of bilateral antrostomy/uncinectomy. Mild mucosal thickening in the alveolar recesses of both maxillary sinuses. No air-fluid levels. Soft tissues: Focal irregularity of the skin over the lateral left forehead with soft  tissue swelling extending into the soft tissues along the lateral aspect of the left orbit and left facial soft tissues. Additional stranding in the facial soft tissues overlying the left masseter muscle. CT CERVICAL SPINE FINDINGS Alignment: Alignment is maintained. No listhesis. No facet subluxation or dislocation. Skull base and vertebrae: No compression fracture or displaced fracture in the cervical spine. No suspicious osseous lesion. Soft tissues and spinal canal: No prevertebral fluid or swelling. No visible canal hematoma. Disc levels: Intervertebral disc space narrowing at multiple levels throughout the cervical spine. Small disc bulges at multiple levels. No high-grade osseous spinal canal stenosis. Facet arthrosis and uncovertebral hypertrophy throughout the cervical spine. Foraminal narrowing is most pronounced on the right at C4-5. Upper chest: Negative. Other: None. IMPRESSION: No CT evidence of acute intracranial abnormality. No acute maxillofacial fracture. No acute fracture or traumatic malalignment of the cervical spine. Laceration over the lateral left forehead with adjacent soft tissue swelling extending into the left periorbital soft tissues. Additional mild left facial soft tissue swelling. Similar ventriculomegaly which may reflect parenchymal volume loss. Normal pressure hydrocephalus is an additional consideration in the appropriate clinical context. Chronic and degenerative changes as above. Electronically Signed   By: Denny Flack M.D.   On: 08/17/2023 19:10   CT Cervical Spine Wo Contrast Result Date: 08/17/2023 CLINICAL DATA:  Trauma, witnessed mechanical fall, laceration to left forehead. EXAM: CT HEAD WITHOUT CONTRAST CT MAXILLOFACIAL WITHOUT CONTRAST CT CERVICAL SPINE WITHOUT CONTRAST TECHNIQUE: Multidetector CT imaging of the head, cervical spine, and maxillofacial structures were performed using the standard protocol without intravenous contrast. Multiplanar CT image  reconstructions of the cervical spine and maxillofacial structures were also generated. RADIATION DOSE REDUCTION: This exam was performed according to the departmental dose-optimization program which includes automated exposure control, adjustment of the mA and/or kV according to patient size and/or use of iterative reconstruction technique. COMPARISON:  CT head 06/21/2023, CT cervical spine 06/09/2023. FINDINGS: CT HEAD FINDINGS Brain: No acute intracranial hemorrhage. No CT evidence of acute infarct. Nonspecific hypoattenuation in the periventricular and subcortical white matter favored to reflect chronic microvascular ischemic changes. Remote lacunar infarcts in the right thalamus and bilateral basal ganglia as well as the left subinsular region. No edema, mass effect, or midline shift. The basilar cisterns are patent. Ventricles: Similar moderate ventriculomegaly which is slightly out of proportion to the degree of sulcal enlargement. Slight crowding of sulci near the vertex and acute callosal angle noted. Vascular: Atherosclerotic calcifications of the carotid siphons. No hyperdense vessel. Skull: No acute or aggressive finding. Other: Mastoid air cells are clear. CT MAXILLOFACIAL FINDINGS Osseous: Maxilla: Intact Pterygoid Plates: Intact Zygomatic Arch: Intact Orbits: Intact Ethmoid: Intact Sphenoid: Intact Frontal:Intact Mandible: Intact. No condylar dislocation. Nasal: Chronic appearing nasal bone deformities are similar to prior. No overlying soft tissue swelling. Nasal  Septum: Intact.  Leftward deviation of the nasal septum. Orbits: Globes are intact. Lenses are normally located. The extraocular muscles and optic nerve sheath complexes are unremarkable. No retrobulbar hematoma. Sinuses: Mild scattered mucosal thickening in the bilateral ethmoid sinuses and in the lateral right frontal sinus. Postsurgical changes of bilateral antrostomy/uncinectomy. Mild mucosal thickening in the alveolar recesses of both  maxillary sinuses. No air-fluid levels. Soft tissues: Focal irregularity of the skin over the lateral left forehead with soft tissue swelling extending into the soft tissues along the lateral aspect of the left orbit and left facial soft tissues. Additional stranding in the facial soft tissues overlying the left masseter muscle. CT CERVICAL SPINE FINDINGS Alignment: Alignment is maintained. No listhesis. No facet subluxation or dislocation. Skull base and vertebrae: No compression fracture or displaced fracture in the cervical spine. No suspicious osseous lesion. Soft tissues and spinal canal: No prevertebral fluid or swelling. No visible canal hematoma. Disc levels: Intervertebral disc space narrowing at multiple levels throughout the cervical spine. Small disc bulges at multiple levels. No high-grade osseous spinal canal stenosis. Facet arthrosis and uncovertebral hypertrophy throughout the cervical spine. Foraminal narrowing is most pronounced on the right at C4-5. Upper chest: Negative. Other: None. IMPRESSION: No CT evidence of acute intracranial abnormality. No acute maxillofacial fracture. No acute fracture or traumatic malalignment of the cervical spine. Laceration over the lateral left forehead with adjacent soft tissue swelling extending into the left periorbital soft tissues. Additional mild left facial soft tissue swelling. Similar ventriculomegaly which may reflect parenchymal volume loss. Normal pressure hydrocephalus is an additional consideration in the appropriate clinical context. Chronic and degenerative changes as above. Electronically Signed   By: Denny Flack M.D.   On: 08/17/2023 19:10   CT Lumbar Spine Wo Contrast Result Date: 08/17/2023 CLINICAL DATA:  Fall. EXAM: CT LUMBAR SPINE WITHOUT CONTRAST TECHNIQUE: Multidetector CT imaging of the lumbar spine was performed without intravenous contrast administration. Multiplanar CT image reconstructions were also generated. RADIATION DOSE  REDUCTION: This exam was performed according to the departmental dose-optimization program which includes automated exposure control, adjustment of the mA and/or kV according to patient size and/or use of iterative reconstruction technique. COMPARISON:  CT of the abdomen pelvis 04/19/2023. FINDINGS: Segmentation: 5 non rib-bearing lumbar type vertebral bodies are present. The lowest fully formed vertebral body is L5. Alignment: No significant listhesis is present. Lumbar lordosis is preserved and within normal limits. Vertebrae: Acute/subacute superior endplate fracture of L2 is new since the prior exam minimal gas is present just above the superior endplate anteriorly. 35% loss of height is noted. No retropulsed bone is present. A more remote superior endplate fracture at L1 is stable. Schmorl's nodes at L2-3 and L3-4 are stable. Chronic endplate degenerative change at L5-S1 is stable. No focal osseous lesions are present. Paraspinal and other soft tissues: Mild paraspinous stranding hemorrhage is present at L2. The paraspinous soft tissues are otherwise within normal limits. Disc levels: L1-2: Mild disc bulging and facet hypertrophy is present without significant stenosis. L2-3: A mild broad-based disc protrusion and moderate bilateral facet hypertrophy is present. Mild subarticular in foraminal narrowing is present bilaterally, left greater than right. L3-4: A broad-based disc protrusion is present. Asymmetric scratched at moderate facet hypertrophy is worse on the left. Mild subarticular and moderate foraminal stenosis is present bilaterally. L4-5: A broad-based disc protrusion is present. Advanced facet hypertrophy is present with spurring left greater than right. Moderate central and bilateral foraminal stenosis is present, left greater than right. L5-S1: A rightward  disc protrusion is present. Moderate facet hypertrophy and spurring is present bilaterally. Mild central and moderate bilateral foraminal  stenosis is present. IMPRESSION: 1. Acute/subacute superior endplate fracture of L2 with 35% loss of height. 2. No retropulsed bone. 3. Stable more remote superior endplate fracture at L1. 4. Multilevel degenerative changes of the lumbar spine as described. 5. Mild subarticular and moderate foraminal stenosis bilaterally at L3-4. 6. Moderate central and bilateral foraminal stenosis at L4-5, left greater than right. 7. Mild central and moderate bilateral foraminal stenosis at L5-S1. Electronically Signed   By: Audree Leas M.D.   On: 08/17/2023 18:57    Procedures .Laceration Repair  Date/Time: 08/17/2023 8:08 PM  Performed by: Sonnie Dusky, PA-C Authorized by: Sonnie Dusky, PA-C   Consent:    Consent obtained:  Verbal   Consent given by:  Patient   Risks discussed:  Infection and pain Laceration details:    Length (cm):  5 Treatment:    Area cleansed with:  Saline and Shur-Clens   Amount of cleaning:  Standard   Irrigation solution:  Sterile saline Skin repair:    Repair method:  Sutures   Suture size:  7-0   Suture material:  Prolene   Suture technique:  Simple interrupted   Number of sutures:  5 Approximation:    Approximation:  Close Repair type:    Repair type:  Simple Post-procedure details:    Dressing:  Antibiotic ointment and non-adherent dressing   Procedure completion:  Tolerated well, no immediate complications     Medications Ordered in ED Medications  acetaminophen (TYLENOL) tablet 650 mg (650 mg Oral Given 08/17/23 2055)  Tdap (BOOSTRIX) injection 0.5 mL (0.5 mLs Intramuscular Given 08/17/23 2056)  lidocaine-EPINEPHrine (XYLOCAINE W/EPI) 2 %-1:200000 (PF) injection 20 mL (20 mLs Infiltration Given 08/17/23 2056)  cephALEXin (KEFLEX) capsule 500 mg (500 mg Oral Given 08/17/23 2055)    ED Course/ Medical Decision Making/ A&P                                 Medical Decision Making Amount and/or Complexity of Data Reviewed Labs: ordered. Radiology:  ordered.  Risk OTC drugs. Prescription drug management.   This patient presents to the ED for concern of fall, this involves an extensive number of treatment options, and is a complaint that carries with it a high risk of complications and morbidity.   Differential diagnosis includes: SAH, SDH, EDH, skull fracture, concussion, hematoma, contusion, abrasion, laceration, etc.   Comorbidities  See HPI above   Additional History  Additional history obtained from prior records   Lab Tests  I ordered and personally interpreted labs.  The pertinent results include:   CMP and CBC are unremarkable UA shows trace leukocytes, rare bacteria, and moderate hemoglobin. Culture pending. Will treat for UTI - patient endorses dysuria.   Imaging Studies  I ordered imaging studies including CT head, maxillofacial, cervical, and lumbar spine  I independently visualized and interpreted imaging which showed:  No CT evidence of acute intracranial abnormality of the head.  No acute maxillofacial fracture.  Laceration over lateral left forehead with adjacent soft tissue swelling. No acute fracture or traumatic malalignment of the cervical spine. Acute/subacute superior endplate fracture of L2 with 35% loss of height.  No retropulsed bone.  Stable more remote superior endplate fracture of L1.  Multilevel degenerative changes of lumbar spine as described. I agree with the radiologist interpretation   Problem List /  ED Course / Critical Interventions / Medication Management  About an hour prior to arrival patient was trying to get up out of a chair, at the facility she will lives at, when she tripped over her pant leg and fell. Patient hit the left side of her face on the ground.  Apparently her glasses, small laceration above the left eyebrow.  Not actively bleeding at time of evaluation.  Some tenderness to her left cheek but denies any headaches, confusion, slurred speech, or vision changes.  No  weakness or impaired range of motion. She is not on blood thinners.  Did not lose consciousness when she hit her head. Tdap is not up-to-date. Patient staffed with my attending Dr. Monnie Anthony. I ordered medications including: Tylenol for headache  Tdap updated Lidocaine for anesthetic prior to sutures.  Bacitracin applied to laceration after sutures placed. Reevaluation of the patient after these medicines showed that the patient improved.   TSLO ordered and orthopedic information provided for patient follow-up with for acute/subacute lumbar fracture. Paper prescription for Keflex given for patient to take back to her facility to get filled.  First dose given in the ED tonight.   Social Determinants of Health  Physical activity   Test / Admission - Considered  Patient is stable and safe for discharge home. Return precautions given.       Final Clinical Impression(s) / ED Diagnoses Final diagnoses:  Fall, initial encounter  Laceration of scalp without foreign body, initial encounter  Acute cystitis with hematuria  Lumbar compression fracture, closed, initial encounter [facility])    Rx / DC Orders ED Discharge Orders          Ordered    cephALEXin (KEFLEX) 500 MG capsule  4 times daily        08/17/23 2126              Sonnie Dusky, PA-C 08/17/23 2132    Trish Furl, MD 08/18/23 1546

## 2023-08-17 NOTE — ED Triage Notes (Signed)
 Patient BIB [facility] from [facility] after witnessed mechanical fall with no LOC. Glasses hit her face on the floor and caused 1in lac to left forehead. Patient reports pain 3-5 out of 10 and pain to her back, oriented x3 per baseline. BP 126/74, HR 73, 95% RA, CBG 106, RR 16.

## 2023-08-17 NOTE — ED Notes (Signed)
 Patient transported to CT

## 2023-08-19 LAB — URINE CULTURE

## 2023-09-20 ENCOUNTER — Other Ambulatory Visit: Payer: Self-pay

## 2023-09-20 ENCOUNTER — Emergency Department (HOSPITAL_COMMUNITY): Admission: EM | Admit: 2023-09-20 | Discharge: 2023-09-21 | Disposition: A | Discharge status: Home / Self Care

## 2023-09-20 ENCOUNTER — Emergency Department (HOSPITAL_COMMUNITY)

## 2023-09-20 DIAGNOSIS — R519 Headache, unspecified: Secondary | ICD-10-CM | POA: Insufficient documentation

## 2023-09-20 DIAGNOSIS — M542 Cervicalgia: Secondary | ICD-10-CM | POA: Diagnosis present

## 2023-09-20 DIAGNOSIS — Y92019 Unspecified place in single-family (private) house as the place of occurrence of the external cause: Secondary | ICD-10-CM | POA: Insufficient documentation

## 2023-09-20 DIAGNOSIS — W01198A Fall on same level from slipping, tripping and stumbling with subsequent striking against other object, initial encounter: Secondary | ICD-10-CM | POA: Insufficient documentation

## 2023-09-20 DIAGNOSIS — S32010A Wedge compression fracture of first lumbar vertebra, initial encounter for closed fracture: Secondary | ICD-10-CM | POA: Insufficient documentation

## 2023-09-20 DIAGNOSIS — S32020A Wedge compression fracture of second lumbar vertebra, initial encounter for closed fracture: Secondary | ICD-10-CM | POA: Diagnosis not present

## 2023-09-20 MED ORDER — ACETAMINOPHEN 500 MG PO TABS
1000.0000 mg | ORAL_TABLET | Freq: Once | ORAL | Status: AC
  Administered 2023-09-20: 1000 mg via ORAL
  Filled 2023-09-20: qty 2

## 2023-09-20 NOTE — ED Triage Notes (Signed)
 Pt BIBA from [facility]. C/o mech fall "tripped over feet". Small lac to forehead, c/o pain in neck, lower back, and forehead.  Not on blood thinners, no LOC.  Mild dementia, but able to answer most questions

## 2023-09-20 NOTE — Progress Notes (Signed)
 Orthopedic Tech Progress Note Patient Details:  Markeisha Beaubien [facility] 01-12-1951 [id number]  Ortho Devices Type of Ortho Device: Thoracolumbar corset (TLSO) Ortho Device/Splint Interventions: Ordered, Application, Adjustment   Post Interventions Patient Tolerated: Well Instructions Provided: Care of device  Herbie Loll 09/20/2023, 9:10 PM

## 2023-09-20 NOTE — Discharge Instructions (Signed)
 Please keep your back in the brace.  Take Tylenol  as needed for pain.  Please call to schedule a follow-up appointment with the neurosurgeon at the number provided.  The neurosurgery team also recommended that you follow-up with your primary care provider to be started on medication for possible osteoporosis.  Please return to the ER for worsening symptoms.

## 2023-09-20 NOTE — ED Provider Notes (Signed)
 [place] EMERGENCY DEPARTMENT AT [facility] Provider Note   CSN: [id number] Arrival date & time: 09/20/23  1733     History  Chief Complaint  Patient presents with   Fall   Head Injury    Jillian Harmon is a 73 y.o. female.  73 year old female presents the emergency department today with headache and neck pain after a mechanical fall at home.  The patient states that she was walking and tripped and fell forward.  She did hit her head but did not lose consciousness.  She is reporting a throbbing frontal headache since then.  Is also reporting some pain in her neck and lower back.  She denies any weakness.  Denies any bowel or bladder dysfunction and denies any saddle anesthesia.  Came to the ER today for further evaluation regarding this.  She is not on any blood thinners.  Denies any chest pain or abdominal pain.   Fall  Head Injury Associated symptoms: neck pain        Home Medications Prior to Admission medications   Medication Sig Start Date End Date Taking? Authorizing Provider  acetaminophen  (TYLENOL ) 500 MG tablet Take 1,000 mg by mouth every 8 (eight) hours as needed for moderate pain (pain score 4-6) or headache.    [provider]  dextromethorphan-guaiFENesin  (ROBITUSSIN-DM) 10-100 MG/5ML liquid Take 10 mLs by mouth every 4 (four) hours as needed for cough.    [provider]  diazepam  (VALIUM ) 5 MG tablet Take 1 tablet (5 mg total) by mouth 3 (three) times daily. 04/20/23   Danford, Willis Harter, MD  divalproex  (DEPAKOTE ) 250 MG DR tablet Take 250 mg by mouth 2 (two) times daily. 05/28/23   [provider]  feeding supplement, ENSURE COMPLETE, (ENSURE COMPLETE) LIQD Take 237 mLs by mouth 2 (two) times daily.    [provider]  levothyroxine (SYNTHROID) 25 MCG tablet Take 25 mcg by mouth daily.    [provider]  loperamide (IMODIUM) 2 MG capsule Take 2-4 mg by mouth See admin instructions. Take 2  tablets by mouth after 1st loose stool,then 1 tablet after each additional loose stool for 5 days as needed(max 8 tablets in 24hours    [provider]  methimazole  (TAPAZOLE ) 5 MG tablet Take 5 mg by mouth 2 (two) times daily.    [provider]  mirtazapine  (REMERON ) 7.5 MG tablet Take 7.5 mg by mouth at bedtime.    [provider]  ondansetron  (ZOFRAN ) 4 MG tablet Take 4 mg by mouth every 8 (eight) hours as needed for nausea or vomiting (FOR 5 DAYS).    [provider]  QUEtiapine  (SEROQUEL ) 25 MG tablet Take 12.5 mg by mouth 2 (two) times daily.    [provider]  simvastatin  (ZOCOR ) 40 MG tablet Take 40 mg by mouth daily.    [provider]  venlafaxine  (EFFEXOR ) 75 MG tablet Take 75 mg by mouth in the morning.    [provider]  venlafaxine  XR (EFFEXOR -XR) 150 MG 24 hr capsule Take 150 mg by mouth in the morning.    [provider]      Allergies    Sertraline    Review of Systems   Review of Systems  Musculoskeletal:  Positive for back pain and neck pain.  All other systems reviewed and are negative.   Physical Exam Updated Vital Signs BP 135/82 (BP Location: Left Arm)   Pulse 70   Temp 97.7 F (36.5 C) (Oral)  Resp 16   Ht 5\' 1"  (1.549 m)   Wt 65 kg   SpO2 100%   BMI 27.08 kg/m  Physical Exam Vitals and nursing note reviewed.   Gen: NAD Eyes: PERRL, EOMI HEENT: no oropharyngeal swelling Neck: trachea midline, c-collar in place, patient is tender over the mid cervical spine with no step-offs or deformities noted Resp: clear to auscultation bilaterally Card: RRR, no murmurs, rubs, or gallops Abd: nontender, nondistended Extremities: no calf tenderness, no edema MSK: The patient is tender over the mid and lower lumbar spine with no step-offs or deformities noted, no thoracic tenderness noted Vascular: 2+ radial pulses bilaterally, 2+ DP pulses bilaterally Skin: Superficial forehead laceration  noted with no bleeding Psyc: acting appropriately   ED Results / Procedures / Treatments   Labs (all labs ordered are listed, but only abnormal results are displayed) Labs Reviewed - No data to display  EKG None  Radiology CT Lumbar Spine Wo Contrast Result Date: 09/20/2023 CLINICAL DATA:  Jillian Harmon.  Back pain. EXAM: CT LUMBAR SPINE WITHOUT CONTRAST TECHNIQUE: Multidetector CT imaging of the lumbar spine was performed without intravenous contrast administration. Multiplanar CT image reconstructions were also generated. RADIATION DOSE REDUCTION: This exam was performed according to the departmental dose-optimization program which includes automated exposure control, adjustment of the mA and/or kV according to patient size and/or use of iterative reconstruction technique. COMPARISON:  None Available. FINDINGS: Segmentation: There are five lumbar type vertebral bodies. The last full intervertebral disc space is labeled L5-S1. Alignment: Normal Vertebrae: Mild compression deformities of L1 and L2 with suspected small fractures involving the anterior superior aspects of both vertebral bodies. These are adjacent to large Schmorl's nodes and there appears to be a small amount of prevertebral hematoma. The L2 vertebral body. Slightly sclerotic and it is possible there may be an old injury also. I do not see a discrete bone lesion and the other bony structures are intact. Advanced lumbar facet disease but no facet or laminar fractures. The transverse processes are intact. Paraspinal and other soft tissues: No significant paraspinal or retroperitoneal findings. Scattered aortic calcifications. Status post cholecystectomy. Disc levels: No large lumbar disc protrusions or canal stenosis. Moderate multilevel facet disease. IMPRESSION: 1. Mild compression deformities of L1 and L2 with suspected small acute fractures involving the anterior superior aspects of both vertebral bodies. These are adjacent to large Schmorl's  nodes and there appears to be a small amount of prevertebral hematoma. 2. No large lumbar disc protrusions or canal stenosis. 3. Advanced lumbar facet disease. 4. Aortic atherosclerosis. Aortic Atherosclerosis (ICD10-I70.0). Electronically Signed   By: Marrian Siva M.D.   On: 09/20/2023 19:03   CT Head Wo Contrast Result Date: 09/20/2023 CLINICAL DATA:  Jillian Harmon.  Hit head. EXAM: CT HEAD WITHOUT CONTRAST CT CERVICAL SPINE WITHOUT CONTRAST TECHNIQUE: Multidetector CT imaging of the head and cervical spine was performed following the standard protocol without intravenous contrast. Multiplanar CT image reconstructions of the cervical spine were also generated. RADIATION DOSE REDUCTION: This exam was performed according to the departmental dose-optimization program which includes automated exposure control, adjustment of the mA and/or kV according to patient size and/or use of iterative reconstruction technique. COMPARISON:  Prior study 08/17/2023 FINDINGS: CT HEAD FINDINGS Brain: Stable age related cerebral atrophy, ventriculomegaly and periventricular white matter disease. Stable prominent cavum septum pellucidum et verge. 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 No extra-axial fluid collections are identified. No CT findings for acute hemispheric infarction or intracranial hemorrhage. No mass lesions. The brainstem and  cerebellum are normal. Vascular: Stable vascular calcifications. No aneurysm or hyperdense vessels. Skull: No skull fracture or bone lesions. Sinuses/Orbits: Scattered ethmoid sinus disease. Evidence of prior sinonasal surgery. Small amount of fluid in the left maxillary sinus. The globes are intact. Other: No scalp lesions or scalp hematoma. CT CERVICAL SPINE FINDINGS Alignment: Normal and stable. Skull base and vertebrae: No acute fracture. Soft tissues and spinal canal: No prevertebral fluid or swelling. No visible canal hematoma. Disc levels: The spinal canal is fairly generous. No  significant canal stenosis. Advanced facet disease and uncinate spurring with stable multilevel foraminal stenosis. Upper chest: The lung apices are grossly clear. Other: None IMPRESSION: 1. Stable age related cerebral atrophy, ventriculomegaly and periventricular white matter disease. 2. 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 3. No acute intracranial findings or skull fracture. 4. Normal alignment of the cervical vertebral bodies and no acute fracture. 5. Stable multilevel facet disease and uncinate spurring with multilevel foraminal stenosis. Electronically Signed   By: Marrian Siva M.D.   On: 09/20/2023 18:56   CT Cervical Spine Wo Contrast Result Date: 09/20/2023 CLINICAL DATA:  Jillian Harmon.  Hit head. EXAM: CT HEAD WITHOUT CONTRAST CT CERVICAL SPINE WITHOUT CONTRAST TECHNIQUE: Multidetector CT imaging of the head and cervical spine was performed following the standard protocol without intravenous contrast. Multiplanar CT image reconstructions of the cervical spine were also generated. RADIATION DOSE REDUCTION: This exam was performed according to the departmental dose-optimization program which includes automated exposure control, adjustment of the mA and/or kV according to patient size and/or use of iterative reconstruction technique. COMPARISON:  Prior study 08/17/2023 FINDINGS: CT HEAD FINDINGS Brain: Stable age related cerebral atrophy, ventriculomegaly and periventricular white matter disease. Stable prominent cavum septum pellucidum et verge. 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 No extra-axial fluid collections are identified. No CT findings for acute hemispheric infarction or intracranial hemorrhage. No mass lesions. The brainstem and cerebellum are normal. Vascular: Stable vascular calcifications. No aneurysm or hyperdense vessels. Skull: No skull fracture or bone lesions. Sinuses/Orbits: Scattered ethmoid sinus disease. Evidence of prior sinonasal surgery. Small amount of fluid  in the left maxillary sinus. The globes are intact. Other: No scalp lesions or scalp hematoma. CT CERVICAL SPINE FINDINGS Alignment: Normal and stable. Skull base and vertebrae: No acute fracture. Soft tissues and spinal canal: No prevertebral fluid or swelling. No visible canal hematoma. Disc levels: The spinal canal is fairly generous. No significant canal stenosis. Advanced facet disease and uncinate spurring with stable multilevel foraminal stenosis. Upper chest: The lung apices are grossly clear. Other: None IMPRESSION: 1. Stable age related cerebral atrophy, ventriculomegaly and periventricular white matter disease. 2. 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 3. No acute intracranial findings or skull fracture. 4. Normal alignment of the cervical vertebral bodies and no acute fracture. 5. Stable multilevel facet disease and uncinate spurring with multilevel foraminal stenosis. Electronically Signed   By: Marrian Siva M.D.   On: 09/20/2023 18:56    Procedures Procedures    Medications Ordered in ED Medications  acetaminophen  (TYLENOL ) tablet 1,000 mg (1,000 mg Oral Given 09/20/23 1837)    ED Course/ Medical Decision Making/ A&P                                 Medical Decision Making 73 year old female presenting to the emergency department today with headache, neck pain, and back pain after she had a mechanical fall earlier today.  Will  further evaluate her here with a CT scan of her head, cervical spine, and lumbar spine for further evaluation for acute traumatic injuries.  I will give patient Tylenol  for her headache.  Will reevaluate for ultimate disposition.  When back to her chart does appear that she is up-to-date on her tetanus and received a booster last month after a mechanical fall.  The patient's imaging studies did show a new compression deformity in her lumbar spine.  Her CT scan of her head and cervical spine were unremarkable.  There was a small amount of possible  swelling or hematoma.  This was discussed with PA Maritza Sidles, neurosurgery who recommends TLSO brace and follow-up.  Patient tells me she never had a TLSO brace during her most recent ER visit.  This was fitted here in the emergency department.  She is ambulatory without difficulty.  Remains neurovascularly intact.  She is discharged with return precautions.  Amount and/or Complexity of Data Reviewed Radiology: ordered.  Risk OTC drugs.           Final Clinical Impression(s) / ED Diagnoses Final diagnoses:  Compression fracture of L1 vertebra, initial encounter (HCC)  Compression fracture of L2 vertebra, initial encounter [facility])    Rx / DC Orders ED Discharge Orders     None         Carin Charleston, MD 09/20/23 2227

## 2023-09-26 ENCOUNTER — Emergency Department (HOSPITAL_COMMUNITY)
Admission: EM | Admit: 2023-09-26 | Discharge: 2023-09-27 | Disposition: A | Discharge status: Skilled Nursing Facility | Source: Home / Self Care | Attending: Emergency Medicine | Admitting: Emergency Medicine

## 2023-09-26 ENCOUNTER — Emergency Department (HOSPITAL_COMMUNITY)
Admission: EM | Admit: 2023-09-26 | Discharge: 2023-09-26 | Disposition: A | Discharge status: Skilled Nursing Facility | Attending: Emergency Medicine | Admitting: Emergency Medicine

## 2023-09-26 ENCOUNTER — Other Ambulatory Visit: Payer: Self-pay

## 2023-09-26 ENCOUNTER — Emergency Department (HOSPITAL_COMMUNITY)

## 2023-09-26 ENCOUNTER — Encounter (HOSPITAL_COMMUNITY): Payer: Self-pay

## 2023-09-26 DIAGNOSIS — E039 Hypothyroidism, unspecified: Secondary | ICD-10-CM | POA: Diagnosis not present

## 2023-09-26 DIAGNOSIS — F0390 Unspecified dementia without behavioral disturbance: Secondary | ICD-10-CM | POA: Diagnosis not present

## 2023-09-26 DIAGNOSIS — Z8673 Personal history of transient ischemic attack (TIA), and cerebral infarction without residual deficits: Secondary | ICD-10-CM | POA: Insufficient documentation

## 2023-09-26 DIAGNOSIS — I10 Essential (primary) hypertension: Secondary | ICD-10-CM | POA: Insufficient documentation

## 2023-09-26 DIAGNOSIS — W19XXXA Unspecified fall, initial encounter: Secondary | ICD-10-CM | POA: Insufficient documentation

## 2023-09-26 DIAGNOSIS — S0990XA Unspecified injury of head, initial encounter: Secondary | ICD-10-CM | POA: Insufficient documentation

## 2023-09-26 LAB — TROPONIN I (HIGH SENSITIVITY): Troponin I (High Sensitivity): <2 ng/L (ref <18)

## 2023-09-26 LAB — CBC WITH DIFFERENTIAL/PLATELET
Abs Immature Granulocytes: 0.03 10*3/uL (ref 0.00–0.07)
Basophils Absolute: 0 10*3/uL (ref 0.0–0.1)
Basophils Relative: 0 %
Eosinophils Absolute: 0.1 10*3/uL (ref 0.0–0.5)
Eosinophils Relative: 1 %
HCT: 39.1 % (ref 36.0–46.0)
Hemoglobin: 12.2 g/dL (ref 12.0–15.0)
Immature Granulocytes: 0 %
Lymphocytes Relative: 16 %
Lymphs Abs: 1.4 10*3/uL (ref 0.7–4.0)
MCH: 29.2 pg (ref 26.0–34.0)
MCHC: 31.2 g/dL (ref 30.0–36.0)
MCV: 93.5 fL (ref 80.0–100.0)
Monocytes Absolute: 0.6 10*3/uL (ref 0.1–1.0)
Monocytes Relative: 7 %
Neutro Abs: 6.8 10*3/uL (ref 1.7–7.7)
Neutrophils Relative %: 76 %
Platelets: 292 10*3/uL (ref 150–400)
RBC: 4.18 MIL/uL (ref 3.87–5.11)
RDW: 14.2 % (ref 11.5–15.5)
WBC: 8.9 10*3/uL (ref 4.0–10.5)
nRBC: 0 % (ref 0.0–0.2)

## 2023-09-26 LAB — BASIC METABOLIC PANEL WITH GFR
Anion gap: 7 (ref 5–15)
BUN: 17 mg/dL (ref 8–23)
CO2: 25 mmol/L (ref 22–32)
Calcium: 8.9 mg/dL (ref 8.9–10.3)
Chloride: 108 mmol/L (ref 98–111)
Creatinine, Ser: 0.72 mg/dL (ref 0.44–1.00)
GFR, Estimated: >60 mL/min (ref >60)
Glucose, Bld: 81 mg/dL (ref 70–99)
Potassium: 4.1 mmol/L (ref 3.5–5.1)
Sodium: 140 mmol/L (ref 135–145)

## 2023-09-26 MED ORDER — LIDOCAINE-EPINEPHRINE (PF) 2 %-1:200000 IJ SOLN
10.0000 mL | Freq: Once | INTRAMUSCULAR | Status: AC
  Administered 2023-09-26: 10 mL
  Filled 2023-09-26: qty 20

## 2023-09-26 MED ORDER — BACITRACIN ZINC 500 UNIT/GM EX OINT: TOPICAL_OINTMENT | Freq: Two times a day (BID) | CUTANEOUS | Status: DC

## 2023-09-26 MED ORDER — ACETAMINOPHEN 500 MG PO TABS
1000.0000 mg | ORAL_TABLET | Freq: Once | ORAL | Status: AC
  Administered 2023-09-26: 1000 mg via ORAL
  Filled 2023-09-26: qty 2

## 2023-09-26 NOTE — ED Provider Notes (Signed)
 [place] EMERGENCY DEPARTMENT AT [facility] Provider Note   CSN: [id number] Arrival date & time: 09/26/23  1834     History  Chief Complaint  Patient presents with   Fall    Laceration, not on blood thinners    Jillian Harmon is a 73 y.o. female.  73 year old female presents today for concern of fall.  She presents from [facility].  She had a fall earlier today and was evaluated and had reassuring workup including CT imaging.  She had a another fall with concern for scalp laceration due to bleeding.  No loss of consciousness.  Alert and oriented at baseline.  She does have history of dementia.  The history is provided by the patient. No language interpreter was used.       Home Medications Prior to Admission medications   Medication Sig Start Date End Date Taking? Authorizing Provider  acetaminophen  (TYLENOL ) 500 MG tablet Take 1,000 mg by mouth every 8 (eight) hours as needed for moderate pain (pain score 4-6) or headache.    [provider]  dextromethorphan-guaiFENesin  (ROBITUSSIN-DM) 10-100 MG/5ML liquid Take 10 mLs by mouth every 4 (four) hours as needed for cough.    [provider]  diazepam  (VALIUM ) 5 MG tablet Take 1 tablet (5 mg total) by mouth 3 (three) times daily. 04/20/23   Danford, Willis Harter, MD  divalproex  (DEPAKOTE ) 250 MG DR tablet Take 250 mg by mouth 2 (two) times daily. 05/28/23   [provider]  feeding supplement, ENSURE COMPLETE, (ENSURE COMPLETE) LIQD Take 237 mLs by mouth 2 (two) times daily.    [provider]  levothyroxine (SYNTHROID) 25 MCG tablet Take 25 mcg by mouth daily.    [provider]  loperamide (IMODIUM) 2 MG capsule Take 2-4 mg by mouth See admin instructions. Take 2 tablets by mouth after 1st loose stool,then 1 tablet after each additional loose stool for 5 days as needed(max 8 tablets in 24hours    [provider]  methimazole  (TAPAZOLE ) 5 MG tablet Take  5 mg by mouth 2 (two) times daily.    [provider]  mirtazapine  (REMERON ) 7.5 MG tablet Take 7.5 mg by mouth at bedtime.    [provider]  ondansetron  (ZOFRAN ) 4 MG tablet Take 4 mg by mouth every 8 (eight) hours as needed for nausea or vomiting (FOR 5 DAYS).    [provider]  QUEtiapine  (SEROQUEL ) 25 MG tablet Take 12.5 mg by mouth 2 (two) times daily.    [provider]  simvastatin  (ZOCOR ) 40 MG tablet Take 40 mg by mouth daily.    [provider]  venlafaxine  (EFFEXOR ) 75 MG tablet Take 75 mg by mouth in the morning.    [provider]  venlafaxine  XR (EFFEXOR -XR) 150 MG 24 hr capsule Take 150 mg by mouth in the morning.    [provider]      Allergies    Sertraline    Review of Systems   Review of Systems  Skin:  Positive for wound.  Neurological:  Negative for syncope.    Physical Exam Updated Vital Signs BP 138/85   Pulse 71   Temp 98.6 F (37 C) (Oral)   Resp 14   Ht 5\' 1"  (1.549 m)   Wt 65 kg   SpO2 99%   BMI 27.08 kg/m  Physical Exam Vitals and nursing note reviewed.  Constitutional:      General: She is not in acute distress.  Appearance: Normal appearance. She is not ill-appearing.  HENT:     Head: Normocephalic and atraumatic.     Comments: See attached image for description of the scalp wound.  No open laceration.  There is an abrasion surrounding the central wound which is likely the source of the bleed.  Currently controlled    Nose: Nose normal.  Eyes:     Conjunctiva/sclera: Conjunctivae normal.  Pulmonary:     Effort: Pulmonary effort is normal. No respiratory distress.  Musculoskeletal:        General: No deformity.  Skin:    Findings: No rash.  Neurological:     Mental Status: She is alert.     ED Results / Procedures / Treatments   Labs (all labs ordered are listed, but only abnormal results are displayed) Labs Reviewed - No data to  display  EKG None  Radiology CT Head Wo Contrast Result Date: 09/26/2023 CLINICAL DATA:  Multiple falls, head injury EXAM: CT HEAD WITHOUT CONTRAST CT CERVICAL SPINE WITHOUT CONTRAST TECHNIQUE: Multidetector CT imaging of the head and cervical spine was performed following the standard protocol without intravenous contrast. Multiplanar CT image reconstructions of the cervical spine were also generated. RADIATION DOSE REDUCTION: This exam was performed according to the departmental dose-optimization program which includes automated exposure control, adjustment of the mA and/or kV according to patient size and/or use of iterative reconstruction technique. COMPARISON:  09/26/2023, 9:32 a.m. FINDINGS: CT HEAD FINDINGS Brain: No evidence of acute infarction, hemorrhage, hydrocephalus, extra-axial collection or mass lesion/mass effect. Incidental note of cavum septum pellucidum et vergae variant of the lateral ventricles. Prominence of the lateral and third ventricles with mild global cerebral atrophy and extensive periventricular and deep white matter hypodensity. Unchanged small lacunar infarction of the right thalamic head (series 3, image 17). Vascular: No hyperdense vessel or unexpected calcification. Skull: Normal. Negative for fracture or focal lesion. Sinuses/Orbits: No acute finding. Status post bilateral maxillary antrostomy. Other: None. CT CERVICAL SPINE FINDINGS Alignment: Degenerative straightening of the normal cervical lordosis. Skull base and vertebrae: No acute fracture. No primary bone lesion or focal pathologic process. Soft tissues and spinal canal: No prevertebral fluid or swelling. No visible canal hematoma. Disc levels: Moderate multilevel cervical disc degenerative disease throughout. Upper chest: Negative. Other: None. IMPRESSION: 1. No acute intracranial pathology. Advanced small-vessel white matter disease and global cerebral atrophy. As previously reported, normal pressure hydrocephalus  can have this appearance if clinical signs and symptoms are present. 2. No fracture or static subluxation of the cervical spine. 3. Moderate multilevel cervical disc degenerative disease. Electronically Signed   By: Fredricka Jenny M.D.   On: 09/26/2023 19:39   CT Cervical Spine Wo Contrast Result Date: 09/26/2023 CLINICAL DATA:  Multiple falls, head injury EXAM: CT HEAD WITHOUT CONTRAST CT CERVICAL SPINE WITHOUT CONTRAST TECHNIQUE: Multidetector CT imaging of the head and cervical spine was performed following the standard protocol without intravenous contrast. Multiplanar CT image reconstructions of the cervical spine were also generated. RADIATION DOSE REDUCTION: This exam was performed according to the departmental dose-optimization program which includes automated exposure control, adjustment of the mA and/or kV according to patient size and/or use of iterative reconstruction technique. COMPARISON:  09/26/2023, 9:32 a.m. FINDINGS: CT HEAD FINDINGS Brain: No evidence of acute infarction, hemorrhage, hydrocephalus, extra-axial collection or mass lesion/mass effect. Incidental note of cavum septum pellucidum et vergae variant of the lateral ventricles. Prominence of the lateral and third ventricles with mild global cerebral atrophy and extensive periventricular and deep white matter  hypodensity. Unchanged small lacunar infarction of the right thalamic head (series 3, image 17). Vascular: No hyperdense vessel or unexpected calcification. Skull: Normal. Negative for fracture or focal lesion. Sinuses/Orbits: No acute finding. Status post bilateral maxillary antrostomy. Other: None. CT CERVICAL SPINE FINDINGS Alignment: Degenerative straightening of the normal cervical lordosis. Skull base and vertebrae: No acute fracture. No primary bone lesion or focal pathologic process. Soft tissues and spinal canal: No prevertebral fluid or swelling. No visible canal hematoma. Disc levels: Moderate multilevel cervical disc  degenerative disease throughout. Upper chest: Negative. Other: None. IMPRESSION: 1. No acute intracranial pathology. Advanced small-vessel white matter disease and global cerebral atrophy. As previously reported, normal pressure hydrocephalus can have this appearance if clinical signs and symptoms are present. 2. No fracture or static subluxation of the cervical spine. 3. Moderate multilevel cervical disc degenerative disease. Electronically Signed   By: Fredricka Jenny M.D.   On: 09/26/2023 19:39   DG Elbow Complete Left Result Date: 09/26/2023 CLINICAL DATA:  Left elbow pain after fall today. EXAM: LEFT ELBOW - COMPLETE 3+ VIEW COMPARISON:  None Available. FINDINGS: There is no evidence of fracture, dislocation, or joint effusion. There is no evidence of arthropathy or other focal bone abnormality. Soft tissues are unremarkable. IMPRESSION: Negative. Electronically Signed   By: Rosalene Colon M.D.   On: 09/26/2023 11:31   DG Elbow Complete Right Result Date: 09/26/2023 CLINICAL DATA:  Right elbow pain after fall today. EXAM: RIGHT ELBOW - COMPLETE 3+ VIEW COMPARISON:  None Available. FINDINGS: There is no evidence of fracture, dislocation, or joint effusion. There is no evidence of arthropathy or other focal bone abnormality. Soft tissues are unremarkable. IMPRESSION: Negative. Electronically Signed   By: Rosalene Colon M.D.   On: 09/26/2023 11:29   DG Pelvis 1-2 Views Result Date: 09/26/2023 CLINICAL DATA:  Fall. EXAM: PELVIS - 1-2 VIEW COMPARISON:  June 05, 2023 FINDINGS: There is no evidence of pelvic fracture or diastasis. No pelvic bone lesions are seen. IMPRESSION: Negative. Electronically Signed   By: Rosalene Colon M.D.   On: 09/26/2023 11:28   DG Chest Port 1 View Result Date: 09/26/2023 CLINICAL DATA:  Fall today. EXAM: PORTABLE CHEST 1 VIEW COMPARISON:  June 05, 2023 FINDINGS: The heart size and mediastinal contours are within normal limits. Both lungs are clear. The visualized  skeletal structures are unremarkable. IMPRESSION: No active disease. Electronically Signed   By: Rosalene Colon M.D.   On: 09/26/2023 11:27   CT Head Wo Contrast Result Date: 09/26/2023 CLINICAL DATA:  Neck trauma EXAM: CT HEAD WITHOUT CONTRAST CT CERVICAL SPINE WITHOUT CONTRAST TECHNIQUE: Multidetector CT imaging of the head and cervical spine was performed following the standard protocol without intravenous contrast. Multiplanar CT image reconstructions of the cervical spine were also generated. RADIATION DOSE REDUCTION: This exam was performed according to the departmental dose-optimization program which includes automated exposure control, adjustment of the mA and/or kV according to patient size and/or use of iterative reconstruction technique. COMPARISON:  08/17/2023 FINDINGS: CT HEAD FINDINGS Brain: No evidence of acute infarction, hemorrhage, hydrocephalus, extra-axial collection or mass lesion/mass effect. Generalized brain atrophy. There is a notable degree of ventriculomegaly with some disproportionate subarachnoid spaces, NPH is possible in the appropriate clinical setting, as noted previously. Chronic lacunar infarct in the right thalamus. Mild white matter low-density. Vascular: No hyperdense vessel or unexpected calcification. Skull: Normal. Negative for fracture or focal lesion. Sinuses/Orbits: No acute finding. CT CERVICAL SPINE FINDINGS Alignment: No traumatic malalignment Skull base  and vertebrae: No acute fracture Soft tissues and spinal canal: No prevertebral fluid or swelling. No visible canal hematoma. Disc levels:  Generalized degenerative endplate and facet spurring. Upper chest: No acute finding IMPRESSION: No acute finding.  Stable compared to 08/17/2023 as described. Electronically Signed   By: Ronnette Coke M.D.   On: 09/26/2023 10:45   CT Cervical Spine Wo Contrast Result Date: 09/26/2023 CLINICAL DATA:  Neck trauma EXAM: CT HEAD WITHOUT CONTRAST CT CERVICAL SPINE WITHOUT  CONTRAST TECHNIQUE: Multidetector CT imaging of the head and cervical spine was performed following the standard protocol without intravenous contrast. Multiplanar CT image reconstructions of the cervical spine were also generated. RADIATION DOSE REDUCTION: This exam was performed according to the departmental dose-optimization program which includes automated exposure control, adjustment of the mA and/or kV according to patient size and/or use of iterative reconstruction technique. COMPARISON:  08/17/2023 FINDINGS: CT HEAD FINDINGS Brain: No evidence of acute infarction, hemorrhage, hydrocephalus, extra-axial collection or mass lesion/mass effect. Generalized brain atrophy. There is a notable degree of ventriculomegaly with some disproportionate subarachnoid spaces, NPH is possible in the appropriate clinical setting, as noted previously. Chronic lacunar infarct in the right thalamus. Mild white matter low-density. Vascular: No hyperdense vessel or unexpected calcification. Skull: Normal. Negative for fracture or focal lesion. Sinuses/Orbits: No acute finding. CT CERVICAL SPINE FINDINGS Alignment: No traumatic malalignment Skull base and vertebrae: No acute fracture Soft tissues and spinal canal: No prevertebral fluid or swelling. No visible canal hematoma. Disc levels:  Generalized degenerative endplate and facet spurring. Upper chest: No acute finding IMPRESSION: No acute finding.  Stable compared to 08/17/2023 as described. Electronically Signed   By: Ronnette Coke M.D.   On: 09/26/2023 10:45    Procedures Procedures    Medications Ordered in ED Medications  lidocaine -EPINEPHrine  (XYLOCAINE  W/EPI) 2 %-1:200000 (PF) injection 10 mL (10 mLs Infiltration Given by Other 09/26/23 2015)    ED Course/ Medical Decision Making/ A&P                                 Medical Decision Making Amount and/or Complexity of Data Reviewed Radiology: ordered.  Risk Prescription drug management.   Medical  Decision Making / ED Course   This patient presents to the ED for concern of fall, this involves an extensive number of treatment options, and is a complaint that carries with it a high risk of complications and morbidity.  The differential diagnosis includes concussion, head injury, intracranial bleed  MDM: Patient presents with concern for mechanical fall.  Had a fall earlier today as well. CT head and C-spine ordered which is overall reassuring without acute concern. Laceration does not require repair after thorough cleaning. Discharged in stable condition.  Return precaution discussed.  Patient appears to be at her baseline.   Additional history obtained: -Additional history obtained from chart review -External records from outside source obtained and reviewed including: Chart review including previous notes, labs, imaging, consultation notes   Lab Tests: -I ordered, reviewed, and interpreted labs.   The pertinent results include:   Labs Reviewed - No data to display    EKG  EKG Interpretation Date/Time:    Ventricular Rate:    PR Interval:    QRS Duration:    QT Interval:    QTC Calculation:   R Axis:      Text Interpretation:           Imaging Studies ordered: I  ordered imaging studies including CT head, C-spine I independently visualized and interpreted imaging. 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   Alcohol-induced persisting dementia (HCC)    Anemia    Anxiety    Closed head injury 2015; early 2017   "fell in my house"   Delirium, withdrawal, alcoholic (HCC) 2012   Depression    Gastrointestinal hemorrhage associated with gastric ulcer    H/O ETOH abuse    "has not had anything to 2 months" (10/2015)   History of blood transfusion 10/18/2015   "anemia"   History of esophagogastroduodenoscopy (EGD) 07/2013   erosive gastritis, no varices ([facility])   History of gastric ulcer    History of subdural hematoma 07/2017   right subdural hematoma extending between the leaves of tentorium on the right side.   Hypertension    per [facility] at [facility]   Hypoglycemia    Hypothyroidism    Memory difficulty 11/19/2015   Memory loss    Seasonal allergies    Seizures (HCC) 11/19/2015   Stroke [facility]) 2007   "dr's weren't sure if it was a stroke or seizure" (10/19/2015)   Subdural hematoma (HCC)    Syncope and collapse    Ventral hernia       Dispostion: Discharged in stable condition.  Return precaution discussed.  Patient voices understanding and is in agreement with plan.  Impression(s) / ED Diagnoses Final diagnoses:  Injury of head, initial encounter    Rx / DC Orders ED Discharge Orders     None         Lucina Sabal, PA-C 09/26/23 2153    Mozell Arias, MD 09/26/23 2312

## 2023-09-26 NOTE — Discharge Instructions (Signed)
 Your CT scans and x-rays did not show any broken bones or bleeding.  Please continue your home medications and follow closely with your primary care doctor.
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
## 2023-09-26 NOTE — ED Triage Notes (Addendum)
 Pt BIBA from [facility]. C/o mech fall this AM. C/o pain in forehead and neck.  Hx dementia, but able to answer most questions.  Satting 90 during triage- placed on 2L [place]

## 2023-09-26 NOTE — Discharge Instructions (Addendum)
 Your laceration is not open.  This likely the abrasion that caused the bleeding.  This is controlled.  CT scan of the head and neck did not show any concerning findings.  Follow-up with your primary care doctor. You can apply Neosporin over the wound.

## 2023-09-26 NOTE — ED Provider Notes (Signed)
 Emergency Department Provider Note   I have reviewed the triage vital signs and the nursing notes.   HISTORY  Chief Complaint Fall, Neck Pain, and Head Injury   HPI Jillian Harmon is a 73 y.o. female with past history reviewed below including from [facility] presents to the emergency department after fall.  Patient tells me she fell yesterday.  She recalls some discomfort in her chest but states it was very brief and not currently having pain.  The exact mechanism of the fall is unknown.  Staff found her down and called EMS.  She is having pain in both elbows as well as her forehead and neck.  In chart review, she has been seen multiple times this year for falls in the ED. staff noticed borderline O2 saturation of 90% and triage and placed her on 2 L nasal cannula.  Patient denies any shortness of breath symptoms.   Level 5 caveat: Dementia.    Past Medical History:  Diagnosis Date   Alcohol related seizure (HCC)    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   Alcohol-induced persisting dementia (HCC)    Anemia    Anxiety    Closed head injury 2015; early 2017   "fell in my house"   Delirium, withdrawal, alcoholic (HCC) 2012   Depression    Gastrointestinal hemorrhage associated with gastric ulcer    H/O ETOH abuse    "has not had anything to 2 months" (10/2015)   History of blood transfusion 10/18/2015   "anemia"   History of esophagogastroduodenoscopy (EGD) 07/2013   erosive gastritis, no varices ([facility])   History of gastric ulcer    History of subdural hematoma 07/2017   right subdural hematoma extending between the leaves of tentorium on the right side.   Hypertension    per [facility] at [facility]   Hypoglycemia    Hypothyroidism    Memory difficulty 11/19/2015   Memory loss    Seasonal allergies    Seizures (HCC) 11/19/2015   Stroke [facility]) 2007   "dr's weren't sure if it was a stroke or seizure" (10/19/2015)   Subdural hematoma (HCC)     Syncope and collapse    Ventral hernia     Review of Systems  Constitutional: No fever/chills Cardiovascular: Positive chest pain. Respiratory: Denies shortness of breath. Gastrointestinal: No abdominal pain.  Skin: Negative for rash. Neurological: Positive HA.   ____________________________________________   PHYSICAL EXAM:  VITAL SIGNS: ED Triage Vitals  Encounter Vitals Group     BP 09/26/23 0845 129/70     Pulse Rate 09/26/23 0845 76     Resp 09/26/23 0845 15     Temp 09/26/23 0845 98.4 F (36.9 C)     Temp Source 09/26/23 0845 Oral     SpO2 09/26/23 0845 90 %   Constitutional: Alert and conversational. Well appearing and in no acute distress. Eyes: Conjunctivae are normal. PERRL.  Head: Mild swelling to the forehead. No laceration.  Nose: No congestion/rhinnorhea. Mouth/Throat: Mucous membranes are moist.   Neck: No stridor. No cervical spine tenderness to palpation. Cardiovascular: Normal rate, regular rhythm. Good peripheral circulation. Grossly normal heart sounds.   Respiratory: Normal respiratory effort.  No retractions. Lungs CTAB. Gastrointestinal: Soft and nontender. No distention.  Musculoskeletal: No lower extremity tenderness nor edema. No gross deformities of extremities.  Mild tenderness to palpation over the with some older appearing bruising.  Range of motion preserved bilaterally but patient grimacing range of motion of bilateral shoulders.  No  wrist tenderness to diffuse palpation.  Neurologic:  Normal speech and language. No gross focal neurologic deficits are appreciated.  Skin:  Skin is warm, dry and intact. No rash noted. Older appearing bruising as above.   ____________________________________________   LABS (all labs ordered are listed, but only abnormal results are displayed)  Labs Reviewed  BASIC METABOLIC PANEL WITH GFR  CBC WITH DIFFERENTIAL/PLATELET  TROPONIN I (HIGH SENSITIVITY)  TROPONIN I (HIGH SENSITIVITY)    ____________________________________________  EKG   EKG Interpretation Date/Time:  Wednesday Sep 26 2023 09:28:38 EDT Ventricular Rate:  74 PR Interval:  142 QRS Duration:  80 QT Interval:  375 QTC Calculation: 416 R Axis:   70  Text Interpretation: Sinus rhythm Confirmed by Abby Hocking [phone]) on 09/26/2023 9:34:49 AM        ____________________________________________  RADIOLOGY  DG Elbow Complete Left Result Date: 09/26/2023 CLINICAL DATA:  Left elbow pain after fall today. EXAM: LEFT ELBOW - COMPLETE 3+ VIEW COMPARISON:  None Available. FINDINGS: There is no evidence of fracture, dislocation, or joint effusion. There is no evidence of arthropathy or other focal bone abnormality. Soft tissues are unremarkable. IMPRESSION: Negative. Electronically Signed   By: Rosalene Colon M.D.   On: 09/26/2023 11:31   DG Elbow Complete Right Result Date: 09/26/2023 CLINICAL DATA:  Right elbow pain after fall today. EXAM: RIGHT ELBOW - COMPLETE 3+ VIEW COMPARISON:  None Available. FINDINGS: There is no evidence of fracture, dislocation, or joint effusion. There is no evidence of arthropathy or other focal bone abnormality. Soft tissues are unremarkable. IMPRESSION: Negative. Electronically Signed   By: Rosalene Colon M.D.   On: 09/26/2023 11:29   DG Pelvis 1-2 Views Result Date: 09/26/2023 CLINICAL DATA:  Fall. EXAM: PELVIS - 1-2 VIEW COMPARISON:  June 05, 2023 FINDINGS: There is no evidence of pelvic fracture or diastasis. No pelvic bone lesions are seen. IMPRESSION: Negative. Electronically Signed   By: Rosalene Colon M.D.   On: 09/26/2023 11:28   DG Chest Port 1 View Result Date: 09/26/2023 CLINICAL DATA:  Fall today. EXAM: PORTABLE CHEST 1 VIEW COMPARISON:  June 05, 2023 FINDINGS: The heart size and mediastinal contours are within normal limits. Both lungs are clear. The visualized skeletal structures are unremarkable. IMPRESSION: No active disease. Electronically Signed   By:  Rosalene Colon M.D.   On: 09/26/2023 11:27   CT Head Wo Contrast Result Date: 09/26/2023 CLINICAL DATA:  Neck trauma EXAM: CT HEAD WITHOUT CONTRAST CT CERVICAL SPINE WITHOUT CONTRAST TECHNIQUE: Multidetector CT imaging of the head and cervical spine was performed following the standard protocol without intravenous contrast. Multiplanar CT image reconstructions of the cervical spine were also generated. RADIATION DOSE REDUCTION: This exam was performed according to the departmental dose-optimization program which includes automated exposure control, adjustment of the mA and/or kV according to patient size and/or use of iterative reconstruction technique. COMPARISON:  08/17/2023 FINDINGS: CT HEAD FINDINGS Brain: No evidence of acute infarction, hemorrhage, hydrocephalus, extra-axial collection or mass lesion/mass effect. Generalized brain atrophy. There is a notable degree of ventriculomegaly with some disproportionate subarachnoid spaces, NPH is possible in the appropriate clinical setting, as noted previously. Chronic lacunar infarct in the right thalamus. Mild white matter low-density. Vascular: No hyperdense vessel or unexpected calcification. Skull: Normal. Negative for fracture or focal lesion. Sinuses/Orbits: No acute finding. CT CERVICAL SPINE FINDINGS Alignment: No traumatic malalignment Skull base and vertebrae: No acute fracture Soft tissues and spinal canal: No prevertebral fluid or swelling. No visible canal  hematoma. Disc levels:  Generalized degenerative endplate and facet spurring. Upper chest: No acute finding IMPRESSION: No acute finding.  Stable compared to 08/17/2023 as described. Electronically Signed   By: Ronnette Coke M.D.   On: 09/26/2023 10:45   CT Cervical Spine Wo Contrast Result Date: 09/26/2023 CLINICAL DATA:  Neck trauma EXAM: CT HEAD WITHOUT CONTRAST CT CERVICAL SPINE WITHOUT CONTRAST TECHNIQUE: Multidetector CT imaging of the head and cervical spine was performed following the  standard protocol without intravenous contrast. Multiplanar CT image reconstructions of the cervical spine were also generated. RADIATION DOSE REDUCTION: This exam was performed according to the departmental dose-optimization program which includes automated exposure control, adjustment of the mA and/or kV according to patient size and/or use of iterative reconstruction technique. COMPARISON:  08/17/2023 FINDINGS: CT HEAD FINDINGS Brain: No evidence of acute infarction, hemorrhage, hydrocephalus, extra-axial collection or mass lesion/mass effect. Generalized brain atrophy. There is a notable degree of ventriculomegaly with some disproportionate subarachnoid spaces, NPH is possible in the appropriate clinical setting, as noted previously. Chronic lacunar infarct in the right thalamus. Mild white matter low-density. Vascular: No hyperdense vessel or unexpected calcification. Skull: Normal. Negative for fracture or focal lesion. Sinuses/Orbits: No acute finding. CT CERVICAL SPINE FINDINGS Alignment: No traumatic malalignment Skull base and vertebrae: No acute fracture Soft tissues and spinal canal: No prevertebral fluid or swelling. No visible canal hematoma. Disc levels:  Generalized degenerative endplate and facet spurring. Upper chest: No acute finding IMPRESSION: No acute finding.  Stable compared to 08/17/2023 as described. Electronically Signed   By: Ronnette Coke M.D.   On: 09/26/2023 10:45    ____________________________________________   PROCEDURES  Procedure(s) performed:   Procedures  None  ____________________________________________   INITIAL IMPRESSION / ASSESSMENT AND PLAN / ED COURSE  Pertinent labs & imaging results that were available during my care of the patient were reviewed by me and considered in my medical decision making (see chart for details).   This patient is Presenting for Evaluation of fall, which does require a range of treatment options, and is a complaint that  involves a high risk of morbidity and mortality.  The Differential Diagnoses includes subdural hematoma, epidural hematoma, acute concussion, traumatic subarachnoid hemorrhage, cerebral contusions, etc.   Critical Interventions-    Medications  acetaminophen  (TYLENOL ) tablet 1,000 mg (1,000 mg Oral Given 09/26/23 1007)    Reassessment after intervention: pain improved.    I did obtain Additional Historical Information from EMS.   I decided to review pertinent External Data, and in summary patient with multiple ED visits for fall this year including last visit 6 days prior.   Clinical Laboratory Tests Ordered, included troponin normal.  Basic metabolic panel shows no AKI.  CBC without leukocytosis or anemia.  Radiologic Tests Ordered, included CT head and C spine. I independently interpreted the images and agree with radiology interpretation.   Cardiac Monitor Tracing which shows NSR.    Social Determinants of Health Risk patient is a non-smoker.   Medical Decision Making: Summary:  Patient presents emergency department for evaluation after fall.  Unclear if the fall was witnessed or exactly how it happened.  Appears to have old bruising and some mild tenderness on my exam.  For fairly broad workup given underlying history of dementia including labs.  EKG shows no acute ischemic change.  Patient does describe some intermittent chest discomfort.  Plan to trend troponin.  O2 saturation borderline in triage.  Will follow chest x-ray but lungs are clear with no respiratory  distress or symptomatic shortness of breath.   Reevaluation with update and discussion with patient.  She is off supplemental oxygen .  Unclear if she needed in the first place with O2 of only 90 on arrival. Awake, alert, with some baseline confusion. Stable for transport back to her facility.   Considered admission but ED workup is largely reassuring.   Patient's presentation is most consistent with acute presentation  with potential threat to life or bodily function.   Disposition: discharge  ____________________________________________  FINAL CLINICAL IMPRESSION(S) / ED DIAGNOSES  Final diagnoses:  Fall, initial encounter  Injury of head, initial encounter    Note:  This document was prepared using Dragon voice recognition software and may include unintentional dictation errors.  Abby Hocking, MD, [facility] Emergency Medicine    Johnnisha Forton, Shereen Dike, MD 09/26/23 1310
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
## 2023-09-26 NOTE — ED Notes (Addendum)
 Pt noted to be at the foot of her bed and was hollering "hello! Hello!" This medic went in the room to assist. She stated she had been "trying to pull this thing up" (talking about her pt gown). Pt was assisted back to the head of the bed via shuffling w/assistance,  readjusted in bed and VS assessed. She asked for a coke and same was provided. She was resting comfortably and had no complaints at this time. Bed in lowest locked position w/bilateral side rails raised. Door kept open to better hear her.

## 2023-10-04 ENCOUNTER — Emergency Department (HOSPITAL_COMMUNITY)

## 2023-10-04 ENCOUNTER — Encounter (HOSPITAL_COMMUNITY): Payer: Self-pay

## 2023-10-04 ENCOUNTER — Other Ambulatory Visit: Payer: Self-pay

## 2023-10-04 ENCOUNTER — Emergency Department (HOSPITAL_COMMUNITY)
Admission: EM | Admit: 2023-10-04 | Discharge: 2023-10-04 | Disposition: A | Discharge status: Home / Self Care | Attending: Emergency Medicine | Admitting: Emergency Medicine

## 2023-10-04 DIAGNOSIS — Z79899 Other long term (current) drug therapy: Secondary | ICD-10-CM | POA: Diagnosis not present

## 2023-10-04 DIAGNOSIS — E039 Hypothyroidism, unspecified: Secondary | ICD-10-CM | POA: Diagnosis not present

## 2023-10-04 DIAGNOSIS — W19XXXA Unspecified fall, initial encounter: Secondary | ICD-10-CM | POA: Diagnosis not present

## 2023-10-04 DIAGNOSIS — S0990XA Unspecified injury of head, initial encounter: Secondary | ICD-10-CM | POA: Insufficient documentation

## 2023-10-04 DIAGNOSIS — I10 Essential (primary) hypertension: Secondary | ICD-10-CM | POA: Insufficient documentation

## 2023-10-04 DIAGNOSIS — R109 Unspecified abdominal pain: Secondary | ICD-10-CM | POA: Diagnosis not present

## 2023-10-04 DIAGNOSIS — F0390 Unspecified dementia without behavioral disturbance: Secondary | ICD-10-CM | POA: Insufficient documentation

## 2023-10-04 LAB — COMPREHENSIVE METABOLIC PANEL WITH GFR
ALT: 13 U/L (ref 0–44)
AST: 28 U/L (ref 15–41)
Albumin: 3.4 g/dL — ABNORMAL LOW (ref 3.5–5.0)
Alkaline Phosphatase: 60 U/L (ref 38–126)
Anion gap: 12 (ref 5–15)
BUN: 16 mg/dL (ref 8–23)
CO2: 18 mmol/L — ABNORMAL LOW (ref 22–32)
Calcium: 8.7 mg/dL — ABNORMAL LOW (ref 8.9–10.3)
Chloride: 108 mmol/L (ref 98–111)
Creatinine, Ser: 0.82 mg/dL (ref 0.44–1.00)
GFR, Estimated: >60 mL/min (ref >60)
Glucose, Bld: 85 mg/dL (ref 70–99)
Potassium: 5.2 mmol/L — ABNORMAL HIGH (ref 3.5–5.1)
Sodium: 138 mmol/L (ref 135–145)
Total Bilirubin: 1.1 mg/dL (ref 0.0–1.2)
Total Protein: 7 g/dL (ref 6.5–8.1)

## 2023-10-04 LAB — CBC WITH DIFFERENTIAL/PLATELET
Abs Immature Granulocytes: 0.01 10*3/uL (ref 0.00–0.07)
Basophils Absolute: 0 10*3/uL (ref 0.0–0.1)
Basophils Relative: 1 %
Eosinophils Absolute: 0.1 10*3/uL (ref 0.0–0.5)
Eosinophils Relative: 2 %
HCT: 37.7 % (ref 36.0–46.0)
Hemoglobin: 11.6 g/dL — ABNORMAL LOW (ref 12.0–15.0)
Immature Granulocytes: 0 %
Lymphocytes Relative: 31 %
Lymphs Abs: 1.8 10*3/uL (ref 0.7–4.0)
MCH: 29.4 pg (ref 26.0–34.0)
MCHC: 30.8 g/dL (ref 30.0–36.0)
MCV: 95.7 fL (ref 80.0–100.0)
Monocytes Absolute: 0.5 10*3/uL (ref 0.1–1.0)
Monocytes Relative: 8 %
Neutro Abs: 3.4 10*3/uL (ref 1.7–7.7)
Neutrophils Relative %: 58 %
Platelets: 201 10*3/uL (ref 150–400)
RBC: 3.94 MIL/uL (ref 3.87–5.11)
RDW: 14.3 % (ref 11.5–15.5)
WBC: 5.9 10*3/uL (ref 4.0–10.5)
nRBC: 0 % (ref 0.0–0.2)

## 2023-10-04 LAB — LIPASE, BLOOD: Lipase: 30 U/L (ref 11–51)

## 2023-10-04 MED ORDER — IOHEXOL 350 MG/ML SOLN
75.0000 mL | Freq: Once | INTRAVENOUS | Status: AC | PRN
  Administered 2023-10-04: 75 mL via INTRAVENOUS

## 2023-10-04 NOTE — Discharge Instructions (Signed)
 Please follow-up with your primary care doctor in 1 week for further evaluation.  You may return to the emergency department for any worsening symptoms.

## 2023-10-04 NOTE — ED Notes (Signed)
 [facility] notified for transportation back to [facility].

## 2023-10-04 NOTE — ED Provider Triage Note (Signed)
 Emergency Medicine Provider Triage Evaluation Note  Jillian Harmon , a 73 y.o. female with dementia was evaluated in triage.  Pt complains of increased falls.  Patient states fall was unwitnessed and she hit her head.  No LOC or thinners.  States she felt dizzy before hand.  Also complains of some abdominal pain.  Review of Systems  Positive:  Negative:   Physical Exam  BP 121/81   Pulse 76   Temp 98.5 F (36.9 C)   Resp 15   Ht 5\' 1"  (1.549 m)   Wt 65 kg   SpO2 99%   BMI 27.08 kg/m  Gen:   Awake, no distress   Resp:  Normal effort  MSK:   Moves extremities without difficulty, tender right knee Other:  Normocephalic atraumatic, no midline cervical tenderness, no gross neurodeficits, abdomen nontender  Medical Decision Making  Medically screening exam initiated at 3:43 PM.  Appropriate orders placed.  Jillian Harmon was informed that the remainder of the evaluation will be completed by another provider, this initial triage assessment does not replace that evaluation, and the importance of remaining in the ED until their evaluation is complete.     Felicie Horning, PA-C 10/04/23 [phone]

## 2023-10-04 NOTE — ED Provider Notes (Signed)
 [place] EMERGENCY DEPARTMENT AT [facility] Provider Note   CSN: [id number] Arrival date & time: 10/04/23  1507     History Chief Complaint  Patient presents with   Fall    Jillian Harmon is a 73 y.o. female with history of dementia, hypertension, hypothyroidism who presents the emergency department today after a fall.  According to triage and EMS, patient had a witnessed fall twice today.  Patient fell to her knees which is abnormal for her.  There was no loss of consciousness and patient did not hit her head.  She is not anticoagulated.  Due to the patient's baseline mental status the rest of the history is limited.   Fall       Home Medications Prior to Admission medications   Medication Sig Start Date End Date Taking? Authorizing Provider  acetaminophen  (TYLENOL ) 500 MG tablet Take 1,000 mg by mouth every 8 (eight) hours as needed for moderate pain (pain score 4-6) or headache.    [provider]  dextromethorphan-guaiFENesin  (ROBITUSSIN-DM) 10-100 MG/5ML liquid Take 10 mLs by mouth every 4 (four) hours as needed for cough.    [provider]  diazepam  (VALIUM ) 5 MG tablet Take 1 tablet (5 mg total) by mouth 3 (three) times daily. 04/20/23   Danford, Willis Harter, MD  divalproex  (DEPAKOTE ) 250 MG DR tablet Take 250 mg by mouth 2 (two) times daily. 05/28/23   [provider]  feeding supplement, ENSURE COMPLETE, (ENSURE COMPLETE) LIQD Take 237 mLs by mouth 2 (two) times daily.    [provider]  levothyroxine (SYNTHROID) 25 MCG tablet Take 25 mcg by mouth daily.    [provider]  loperamide (IMODIUM) 2 MG capsule Take 2-4 mg by mouth See admin instructions. Take 2 tablets by mouth after 1st loose stool,then 1 tablet after each additional loose stool for 5 days as needed(max 8 tablets in 24hours    [provider]  methimazole  (TAPAZOLE ) 5 MG tablet Take 5 mg by mouth 2 (two) times daily.    [provider]  mirtazapine  (REMERON ) 7.5 MG tablet Take 7.5 mg by mouth at bedtime.    [provider]  ondansetron  (ZOFRAN ) 4 MG tablet Take 4 mg by mouth every 8 (eight) hours as needed for nausea or vomiting (FOR 5 DAYS).    [provider]  QUEtiapine  (SEROQUEL ) 25 MG tablet Take 12.5 mg by mouth 2 (two) times daily.    [provider]  simvastatin  (ZOCOR ) 40 MG tablet Take 40 mg by mouth daily.    [provider]  venlafaxine  (EFFEXOR ) 75 MG tablet Take 75 mg by mouth in the morning.    [provider]  venlafaxine  XR (EFFEXOR -XR) 150 MG 24 hr capsule Take 150 mg by mouth in the morning.    [provider]      Allergies    Sertraline    Review of Systems   Review of Systems  All other systems reviewed and are negative.   Physical Exam Updated Vital Signs BP (!) 178/91 (BP Location: Right Arm)   Pulse 81   Temp 98 F (36.7 C) (Oral)   Resp 18   Ht 5\' 1"  (1.549 m)   Wt 65 kg   SpO2 97%   BMI 27.08 kg/m  Physical Exam Vitals and nursing note reviewed.  Constitutional:      General: She is not in acute distress.    Appearance: Normal appearance.  HENT:  Head: Normocephalic and atraumatic.  Eyes:     General:        Right eye: No discharge.        Left eye: No discharge.  Cardiovascular:     Comments: Regular rate and rhythm.  S1/S2 are distinct without any evidence of murmur, rubs, or gallops.  Radial pulses are 2+ bilaterally.  Dorsalis pedis pulses are 2+ bilaterally.  No evidence of pedal edema. Pulmonary:     Comments: Clear to auscultation bilaterally.  Normal effort.  No respiratory distress.  No evidence of wheezes, rales, or rhonchi heard throughout. Chest:     Comments: Chest wall is nontender to palpation and stable to palpation.  Equal chest rise. Abdominal:     General: Abdomen is flat. Bowel sounds are normal. There is no distension.     Tenderness: There is no abdominal tenderness. There is no  guarding or rebound.  Musculoskeletal:        General: Normal range of motion.     Cervical back: Neck supple.     Comments: Pelvis is stable to palpation.  No tenderness.  Skin:    General: Skin is warm and dry.     Findings: No rash.  Neurological:     General: No focal deficit present.     Mental Status: She is alert.  Psychiatric:        Mood and Affect: Mood normal.        Behavior: Behavior normal.     ED Results / Procedures / Treatments   Labs (all labs ordered are listed, but only abnormal results are displayed) Labs Reviewed  CBC WITH DIFFERENTIAL/PLATELET - Abnormal; Notable for the following components:      Result Value   Hemoglobin 11.6 (*)    All other components within normal limits  COMPREHENSIVE METABOLIC PANEL WITH GFR - Abnormal; Notable for the following components:   Potassium 5.2 (*)    CO2 18 (*)    Calcium 8.7 (*)    Albumin 3.4 (*)    All other components within normal limits  LIPASE, BLOOD    EKG None  Radiology CT ABDOMEN PELVIS W CONTRAST Result Date: 10/04/2023 CLINICAL DATA:  Abdominal pain, acute, nonlocalized, fall EXAM: CT ABDOMEN AND PELVIS WITH CONTRAST TECHNIQUE: Multidetector CT imaging of the abdomen and pelvis was performed using the standard protocol following bolus administration of intravenous contrast. RADIATION DOSE REDUCTION: This exam was performed according to the departmental dose-optimization program which includes automated exposure control, adjustment of the mA and/or kV according to patient size and/or use of iterative reconstruction technique. CONTRAST:  75mL OMNIPAQUE  IOHEXOL  350 MG/ML SOLN COMPARISON:  Sep 26, 2023,, Sep 20, 2023, April 19, 2023 FINDINGS: Lower chest: No focal airspace consolidation or pleural effusion.Unchanged subsegmental and posterior bibasilar dependent atelectasis. Hepatobiliary: No mass.Cholecystectomy. No intrahepatic or extrahepatic biliary ductal dilation. The portal veins are patent.  Pancreas: No mass or main ductal dilation. No peripancreatic inflammation or fluid collection. Spleen: Normal size. No mass. Adrenals/Urinary Tract: No adrenal masses. A couple of subcentimeter hypodensities are noted in the kidneys, too small to definitively characterize, but likely small cysts. No nephrolithiasis or hydronephrosis. Small phlebolith along the distal left ureter. The urinary bladder is distended without focal abnormality. Stomach/Bowel: The stomach contains ingested material without focal abnormality. No small bowel wall thickening or inflammation. No small bowel obstruction.Normal appendix. Vascular/Lymphatic: No aortic aneurysm. Scattered aortoiliac atherosclerosis. no intraabdominal or pelvic lymphadenopathy. Reproductive: Age-related atrophy of the uterus and ovaries. No concerning adnexal  mass.No free pelvic fluid. Other: No pneumoperitoneum, ascites, or mesenteric inflammation. Musculoskeletal: No acute fracture or destructive lesion. Multilevel degenerative disc disease of the spine. Unchanged, mild compression fractures of L1 and L2. IMPRESSION: 1. No acute intraabdominal or pelvic abnormality. 2. Unchanged, mild compression fractures of L1 and L2. No retropulsion. Electronically Signed   By: Rance Burrows M.D.   On: 10/04/2023 20:56   CT Head Wo Contrast Result Date: 10/04/2023 CLINICAL DATA:  Fall, head trauma EXAM: CT HEAD WITHOUT CONTRAST CT CERVICAL SPINE WITHOUT CONTRAST TECHNIQUE: Multidetector CT imaging of the head and cervical spine was performed following the standard protocol without intravenous contrast. Multiplanar CT image reconstructions of the cervical spine were also generated. RADIATION DOSE REDUCTION: This exam was performed according to the departmental dose-optimization program which includes automated exposure control, adjustment of the mA and/or kV according to patient size and/or use of iterative reconstruction technique. COMPARISON:  09/26/2023 FINDINGS: CT  HEAD FINDINGS Brain: No evidence of acute infarction, hemorrhage, hydrocephalus, extra-axial collection or mass lesion/mass effect. Extensive periventricular and deep white matter hypodensity. Unchanged prominence of the lateral and third ventricles with mild global cerebral volume loss. Incidental note of cavum septum pellucidum et vergae variant of the lateral ventricles. Vascular: No hyperdense vessel or unexpected calcification. Skull: Normal. Negative for fracture or focal lesion. Sinuses/Orbits: No acute finding. Other: None. CT CERVICAL SPINE FINDINGS Alignment: Degenerative straightening of the normal cervical lordosis. Skull base and vertebrae: No acute fracture. No primary bone lesion or focal pathologic process. Soft tissues and spinal canal: No prevertebral fluid or swelling. No visible canal hematoma. Disc levels: Moderate multilevel cervical disc degenerative disease. Upper chest: Negative. Other: None. IMPRESSION: 1. No acute intracranial pathology. 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 As previously reported, normal pressure hydrocephalus can have this appearance if clinically referable signs and symptoms are present. 2. No fracture or static subluxation of the cervical spine. 3. Moderate multilevel cervical disc degenerative disease. Electronically Signed   By: Fredricka Jenny M.D.   On: 10/04/2023 20:11   CT Cervical Spine Wo Contrast Result Date: 10/04/2023 CLINICAL DATA:  Fall, head trauma EXAM: CT HEAD WITHOUT CONTRAST CT CERVICAL SPINE WITHOUT CONTRAST TECHNIQUE: Multidetector CT imaging of the head and cervical spine was performed following the standard protocol without intravenous contrast. Multiplanar CT image reconstructions of the cervical spine were also generated. RADIATION DOSE REDUCTION: This exam was performed according to the departmental dose-optimization program which includes automated exposure control, adjustment of the mA and/or kV according  to patient size and/or use of iterative reconstruction technique. COMPARISON:  09/26/2023 FINDINGS: CT HEAD FINDINGS Brain: No evidence of acute infarction, hemorrhage, hydrocephalus, extra-axial collection or mass lesion/mass effect. Extensive periventricular and deep white matter hypodensity. Unchanged prominence of the lateral and third ventricles with mild global cerebral volume loss. Incidental note of cavum septum pellucidum et vergae variant of the lateral ventricles. Vascular: No hyperdense vessel or unexpected calcification. Skull: Normal. Negative for fracture or focal lesion. Sinuses/Orbits: No acute finding. Other: None. CT CERVICAL SPINE FINDINGS Alignment: Degenerative straightening of the normal cervical lordosis. Skull base and vertebrae: No acute fracture. No primary bone lesion or focal pathologic process. Soft tissues and spinal canal: No prevertebral fluid or swelling. No visible canal hematoma. Disc levels: Moderate multilevel cervical disc degenerative disease. Upper chest: Negative. Other: None. IMPRESSION: 1. No acute intracranial pathology. 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 As previously reported, normal pressure hydrocephalus can have this appearance if clinically referable signs  and symptoms are present. 2. No fracture or static subluxation of the cervical spine. 3. Moderate multilevel cervical disc degenerative disease. Electronically Signed   By: Fredricka Jenny M.D.   On: 10/04/2023 20:11   DG Knee 2 Views Right Result Date: 10/04/2023 CLINICAL DATA:  Fall, knee pain EXAM: RIGHT KNEE - 1-2 VIEW COMPARISON:  None Available. FINDINGS: Anterior soft tissue swelling. No acute bony abnormality. Specifically, no fracture, subluxation, or dislocation. Joint spaces maintained. No joint effusion. IMPRESSION: No acute bony abnormality. Electronically Signed   By: Janeece Mechanic M.D.   On: 10/04/2023 18:30    Procedures Procedures    Medications Ordered  in ED Medications  iohexol  (OMNIPAQUE ) 350 MG/ML injection 75 mL (75 mLs Intravenous Contrast Given 10/04/23 1914)    ED Course/ Medical Decision Making/ A&P Clinical Course as of 10/04/23 2120  Thu Oct 04, 2023  2118 CBC with Differential(!) Negative. [CF]  2118 Comprehensive metabolic panel with GFR(!) Hyperkalemia.  Otherwise no abnormalities. [CF]  2119 Lipase, blood Negative. [CF]  2119 CT ABDOMEN PELVIS W CONTRAST I personally ordered interpreted the study did not see any evidence of acute abdomen.  I do agree with radiologist interpretation. [CF]  2119 I got imaging over the CT head and cervical spine.  No evidence of acute hemorrhage or spinal fracture.  I do agree with the radiologist interpretation. [CF]  2119 DG Knee 2 Views Right I personally ordered and interpreted the study.  I do agree with the radiologist interpretation.  No fracture evident. [CF]    Clinical Course User Index [CF] Darletta Ehrich, PA-C   {   Click here for ABCD2, HEART and other calculators  Medical Decision Making Shilpa Marlise Fahr is a 73 y.o. female patient who presents to the emergency department today for further evaluation of mechanical trip and fall.  Will get imaging which was ordered in triage.  Patient seems to be at baseline as it relates to mental status from previous chart review.  She is confused at baseline but is interactive and answers my questions.  All of her imaging is negative.  She is in no acute distress and continues to walk around the department without difficulty.  She is overall safe for discharge.  I will have her follow-up with her primary care doctor at her facility for further evaluation.     Final Clinical Impression(s) / ED Diagnoses Final diagnoses:  Accident due to mechanical fall without injury, initial encounter    Rx / DC Orders ED Discharge Orders     None         Olympia Bianchi 10/04/23 2120    Merdis Stalling, MD 10/04/23  2314

## 2023-10-04 NOTE — ED Notes (Signed)
 Discharge instructions reviewed.   Opportunity for questions and concerns provided.   Alert, oriented and ambulatory. Displays no signs of distress.

## 2023-10-04 NOTE — ED Triage Notes (Signed)
 Pt BIB [facility] from [facility] memory care unit for witnessed fall x2. Per EMS, pt fell to her knees twice "and that's not normal for her". No LOC, No thinners, did not hit head.  132/74 76HR Cbg 122

## 2023-10-07 ENCOUNTER — Encounter (HOSPITAL_COMMUNITY): Payer: Self-pay | Admitting: Emergency Medicine

## 2023-10-07 ENCOUNTER — Other Ambulatory Visit: Payer: Self-pay

## 2023-10-07 ENCOUNTER — Emergency Department (HOSPITAL_COMMUNITY)
Admission: EM | Admit: 2023-10-07 | Discharge: 2023-10-07 | Disposition: A | Discharge status: Home / Self Care | Attending: Emergency Medicine | Admitting: Emergency Medicine

## 2023-10-07 DIAGNOSIS — S0990XA Unspecified injury of head, initial encounter: Secondary | ICD-10-CM

## 2023-10-07 DIAGNOSIS — R519 Headache, unspecified: Secondary | ICD-10-CM | POA: Diagnosis present

## 2023-10-07 DIAGNOSIS — W19XXXA Unspecified fall, initial encounter: Secondary | ICD-10-CM | POA: Insufficient documentation

## 2023-10-07 DIAGNOSIS — S0101XA Laceration without foreign body of scalp, initial encounter: Secondary | ICD-10-CM | POA: Insufficient documentation

## 2023-10-07 DIAGNOSIS — R296 Repeated falls: Secondary | ICD-10-CM | POA: Insufficient documentation

## 2023-10-07 LAB — CBG MONITORING, ED: Glucose-Capillary: 90 mg/dL (ref 70–99)

## 2023-10-07 NOTE — Discharge Instructions (Signed)
 Patient has a minor cut to the back of her head.  Keep this clean and apply antibiotic ointment.  You may apply well wrapped ice pack to forehead where there is swelling.  Give extra strength Tylenol  every 6 hours as needed for pain.  Patient should have a recheck with the facility provider in 2 to 3 days.

## 2023-10-07 NOTE — ED Provider Notes (Signed)
 [place] EMERGENCY DEPARTMENT AT [facility] Provider Note   CSN: [id number] Arrival date & time: 10/07/23  1322     History  Chief Complaint  Patient presents with   Fall   Head Laceration    Jillian Harmon is a 73 y.o. female.  HPI Patient is from memory care at [facility] with frequent falls.  Patient does not remember how she fell.  Fall was unwitnessed.  Patient is awake and alert.  She reports she has had some pain in her forehead where she has a bruise.  She denies other complaint.  No neck pain.  She denies focal weakness numbness or tingling to extremities.  Patient does not take any blood thinners.    Home Medications Prior to Admission medications   Medication Sig Start Date End Date Taking? Authorizing Provider  acetaminophen  (TYLENOL ) 500 MG tablet Take 1,000 mg by mouth every 8 (eight) hours as needed for moderate pain (pain score 4-6) or headache.   Yes [provider]  dextromethorphan-guaiFENesin  (ROBITUSSIN-DM) 10-100 MG/5ML liquid Take 10 mLs by mouth every 4 (four) hours as needed for cough.   Yes [provider]  diazepam  (VALIUM ) 5 MG tablet Take 1 tablet (5 mg total) by mouth 3 (three) times daily. 04/20/23  Yes Danford, Willis Harter, MD  divalproex  (DEPAKOTE ) 250 MG DR tablet Take 250 mg by mouth 2 (two) times daily. 05/28/23  Yes [provider]  feeding supplement, ENSURE COMPLETE, (ENSURE COMPLETE) LIQD Take 237 mLs by mouth 2 (two) times daily.   Yes [provider]  haloperidol (HALDOL) 0.5 MG tablet Take 0.5 mg by mouth 2 (two) times daily as needed for agitation.   Yes [provider]  methimazole  (TAPAZOLE ) 5 MG tablet Take 5 mg by mouth in the morning.   Yes [provider]  mirtazapine  (REMERON ) 15 MG tablet Take 15 mg by mouth at bedtime.   Yes [provider]  PRESCRIPTION MEDICATION Apply topically 2 (two) times daily as needed (psychotic agitation). ABH gel  2/25/2  4 clicks (1 ml) topically twice daily as needed   Yes [provider]  simvastatin  (ZOCOR ) 40 MG tablet Take 40 mg by mouth daily.   Yes [provider]  traMADol  (ULTRAM ) 50 MG tablet Take 50 mg by mouth 2 (two) times daily.   Yes [provider]  venlafaxine  (EFFEXOR ) 75 MG tablet Take 75 mg by mouth in the morning.   Yes [provider]  venlafaxine  XR (EFFEXOR -XR) 150 MG 24 hr capsule Take 150 mg by mouth in the morning.   Yes [provider]      Allergies    Sertraline    Review of Systems   Review of Systems  Physical Exam Updated Vital Signs BP (!) 144/81   Pulse 75   Temp 99 F (37.2 C) (Oral)   Resp 20   Ht 5\' 1"  (1.549 m)   Wt 65 kg   SpO2 98%   BMI 27.08 kg/m  Physical Exam Constitutional:      Comments: Patient is alert and pleasantly interactive.  She has poor recall for events.  HENT:     Head:     Comments: Patient has small hematoma to left forehead.  No overlying laceration.  Posterior scalp has a minor puncture like laceration just a few millimeters.  No significant associated hematoma at this time.    Right Ear: Tympanic membrane normal.     Left Ear: Tympanic membrane  normal.     Ears:     Comments: Bilateral TMs normal no hemotympanum.    Nose: Nose normal.     Mouth/Throat:     Mouth: Mucous membranes are moist.     Pharynx: Oropharynx is clear.  Eyes:     Extraocular Movements: Extraocular movements intact.     Pupils: Pupils are equal, round, and reactive to light.  Neck:     Comments: No C-spine tenderness to palpation. Cardiovascular:     Rate and Rhythm: Normal rate and regular rhythm.  Pulmonary:     Effort: Pulmonary effort is normal.     Breath sounds: Normal breath sounds.  Chest:     Chest wall: No tenderness.  Abdominal:     General: There is no distension.     Palpations: Abdomen is soft.     Tenderness: There is no abdominal tenderness. There is no guarding.   Musculoskeletal:        General: No swelling or deformity. Normal range of motion.  Skin:    General: Skin is warm and dry.  Neurological:     Comments: Patient is alert.  Speech content is mildly confused with poor recall but speech production is normal.  She is exchanging conversation and answering questions.  Focal neurologic deficits.  Patient is moving all extremities spontaneously and at command.  Psychiatric:        Mood and Affect: Mood normal.     ED Results / Procedures / Treatments   Labs (all labs ordered are listed, but only abnormal results are displayed) Labs Reviewed  CBG MONITORING, ED    EKG None  Radiology No results found.  Procedures Procedures    Medications Ordered in ED Medications - No data to display  ED Course/ Medical Decision Making/ A&P                                 Medical Decision Making  Patient presents as [place].  She is at memory care at [facility].  She has had frequent falls with multiple presentations this month and last month for falls.  Clinically patient does not have signs of significant musculoskeletal injury.  Patient does not have significant pain and has intact movements.  She does have a hematoma to the forehead.  Patient is not anticoagulated.  At this time she is alert with no signs of somnolence and speech production is good.  She has known dementia with poor recall.  Patient has had multiple CT scan and CT head due to falls.  Most recent 1 was 2 days ago.  Patient has had 10 CT scans of the head this year.  At this time with the patient being alert and no signs of distress, clinically I do not feel that repeat CT scan is indicated.  Appropriate for observation at her memory care facility for change from baseline.        Final Clinical Impression(s) / ED Diagnoses Final diagnoses:  Injury of head, initial encounter  Frequent falls    Rx / DC Orders ED Discharge Orders     None         Wynetta Heckle, MD 10/07/23 1559

## 2023-10-07 NOTE — ED Notes (Signed)
 Attempted to call report-no answer

## 2023-10-07 NOTE — ED Notes (Signed)
 Report given to Tim, Intermediate

## 2023-10-07 NOTE — ED Triage Notes (Signed)
 Pt bib [facility] from [facility] memory care unit for unwitnessed fall. Laceration to back of head already wrapped by ems prior to arrival.  No thinners noted.

## 2023-10-07 NOTE — ED Notes (Signed)
 Attempted to call report 2 more times no answer.

## 2023-10-08 ENCOUNTER — Emergency Department (HOSPITAL_COMMUNITY)
Admission: EM | Admit: 2023-10-08 | Discharge: 2023-10-08 | Disposition: A | Discharge status: Home / Self Care | Attending: Emergency Medicine | Admitting: Emergency Medicine

## 2023-10-08 ENCOUNTER — Emergency Department (HOSPITAL_COMMUNITY)
Admission: EM | Admit: 2023-10-08 | Discharge: 2023-10-09 | Disposition: A | Discharge status: Home / Self Care | Source: Home / Self Care | Attending: Emergency Medicine | Admitting: Emergency Medicine

## 2023-10-08 ENCOUNTER — Emergency Department (HOSPITAL_COMMUNITY)

## 2023-10-08 ENCOUNTER — Other Ambulatory Visit: Payer: Self-pay

## 2023-10-08 ENCOUNTER — Encounter (HOSPITAL_COMMUNITY): Payer: Self-pay

## 2023-10-08 DIAGNOSIS — W19XXXA Unspecified fall, initial encounter: Secondary | ICD-10-CM | POA: Diagnosis not present

## 2023-10-08 DIAGNOSIS — S0990XA Unspecified injury of head, initial encounter: Secondary | ICD-10-CM

## 2023-10-08 DIAGNOSIS — M542 Cervicalgia: Secondary | ICD-10-CM | POA: Insufficient documentation

## 2023-10-08 DIAGNOSIS — S0181XA Laceration without foreign body of other part of head, initial encounter: Secondary | ICD-10-CM

## 2023-10-08 DIAGNOSIS — F0390 Unspecified dementia without behavioral disturbance: Secondary | ICD-10-CM | POA: Insufficient documentation

## 2023-10-08 DIAGNOSIS — S01111A Laceration without foreign body of right eyelid and periocular area, initial encounter: Secondary | ICD-10-CM | POA: Diagnosis present

## 2023-10-08 DIAGNOSIS — S0101XA Laceration without foreign body of scalp, initial encounter: Secondary | ICD-10-CM | POA: Diagnosis not present

## 2023-10-08 DIAGNOSIS — S8002XA Contusion of left knee, initial encounter: Secondary | ICD-10-CM | POA: Insufficient documentation

## 2023-10-08 DIAGNOSIS — M545 Low back pain, unspecified: Secondary | ICD-10-CM | POA: Diagnosis not present

## 2023-10-08 DIAGNOSIS — R079 Chest pain, unspecified: Secondary | ICD-10-CM | POA: Diagnosis not present

## 2023-10-08 DIAGNOSIS — I10 Essential (primary) hypertension: Secondary | ICD-10-CM | POA: Insufficient documentation

## 2023-10-08 DIAGNOSIS — S0083XA Contusion of other part of head, initial encounter: Secondary | ICD-10-CM

## 2023-10-08 DIAGNOSIS — M25561 Pain in right knee: Secondary | ICD-10-CM | POA: Diagnosis not present

## 2023-10-08 DIAGNOSIS — E039 Hypothyroidism, unspecified: Secondary | ICD-10-CM | POA: Insufficient documentation

## 2023-10-08 DIAGNOSIS — S8000XA Contusion of unspecified knee, initial encounter: Secondary | ICD-10-CM

## 2023-10-08 LAB — URINALYSIS, ROUTINE W REFLEX MICROSCOPIC
Bacteria, UA: NONE SEEN
Bilirubin Urine: NEGATIVE
Glucose, UA: NEGATIVE mg/dL
Ketones, ur: 5 mg/dL — AB
Nitrite: NEGATIVE
Protein, ur: 30 mg/dL — AB
Specific Gravity, Urine: 1.014 (ref 1.005–1.030)
pH: 7 (ref 5.0–8.0)

## 2023-10-08 LAB — BASIC METABOLIC PANEL WITH GFR
Anion gap: 10 (ref 5–15)
BUN: 15 mg/dL (ref 8–23)
CO2: 24 mmol/L (ref 22–32)
Calcium: 9 mg/dL (ref 8.9–10.3)
Chloride: 105 mmol/L (ref 98–111)
Creatinine, Ser: 0.83 mg/dL (ref 0.44–1.00)
GFR, Estimated: >60 mL/min (ref >60)
Glucose, Bld: 102 mg/dL — ABNORMAL HIGH (ref 70–99)
Potassium: 3.9 mmol/L (ref 3.5–5.1)
Sodium: 139 mmol/L (ref 135–145)

## 2023-10-08 LAB — CBC
HCT: 38.4 % (ref 36.0–46.0)
Hemoglobin: 12.3 g/dL (ref 12.0–15.0)
MCH: 29.9 pg (ref 26.0–34.0)
MCHC: 32 g/dL (ref 30.0–36.0)
MCV: 93.2 fL (ref 80.0–100.0)
Platelets: 248 10*3/uL (ref 150–400)
RBC: 4.12 MIL/uL (ref 3.87–5.11)
RDW: 14.5 % (ref 11.5–15.5)
WBC: 7.8 10*3/uL (ref 4.0–10.5)
nRBC: 0 % (ref 0.0–0.2)

## 2023-10-08 LAB — VALPROIC ACID LEVEL: Valproic Acid Lvl: 34 ug/mL — ABNORMAL LOW (ref 50–100)

## 2023-10-08 LAB — CBG MONITORING, ED
Glucose-Capillary: 131 mg/dL — ABNORMAL HIGH (ref 70–99)
Glucose-Capillary: 105 mg/dL — ABNORMAL HIGH (ref 70–99)

## 2023-10-08 MED ORDER — ACETAMINOPHEN 500 MG PO TABS
1000.0000 mg | ORAL_TABLET | Freq: Once | ORAL | Status: AC
  Administered 2023-10-08: 1000 mg via ORAL
  Filled 2023-10-08: qty 2

## 2023-10-08 MED ORDER — BACITRACIN ZINC 500 UNIT/GM EX OINT
TOPICAL_OINTMENT | CUTANEOUS | Status: AC
  Administered 2023-10-08: 1
  Filled 2023-10-08: qty 0.9

## 2023-10-08 MED ORDER — BACITRACIN ZINC 500 UNIT/GM EX OINT
TOPICAL_OINTMENT | Freq: Two times a day (BID) | CUTANEOUS | Status: DC
  Administered 2023-10-08: 1 via TOPICAL
  Filled 2023-10-08: qty 0.9

## 2023-10-08 NOTE — ED Provider Notes (Signed)
 [place] EMERGENCY DEPARTMENT AT [facility] Provider Note   CSN: [id number] Arrival date & time: 10/08/23  2130     History  Chief Complaint  Patient presents with   Fall    Barbaraann Bissett Pollinger is a 73 y.o. female.  HPI Patient has had frequent falls this year.  She resides in memory care at [facility].  Patient endorses a headache.  She endorses feeling dizzy.  Patient reports pain in both of her knees.  She does request something to eat and drink.  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    Home Medications Prior to Admission medications   Medication Sig Start Date End Date Taking? Authorizing Provider  acetaminophen  (TYLENOL ) 500 MG tablet Take 1,000 mg by mouth every 8 (eight) hours as needed for moderate pain (pain score 4-6) or headache.    [provider]  dextromethorphan-guaiFENesin  (ROBITUSSIN-DM) 10-100 MG/5ML liquid Take 10 mLs by mouth every 4 (four) hours as needed for cough.    [provider]  diazepam  (VALIUM ) 5 MG tablet Take 1 tablet (5 mg total) by mouth 3 (three) times daily. 04/20/23   Danford, Willis Harter, MD  divalproex  (DEPAKOTE ) 250 MG DR tablet Take 250 mg by mouth 2 (two) times daily. 05/28/23   [provider]  feeding supplement, ENSURE COMPLETE, (ENSURE COMPLETE) LIQD Take 237 mLs by mouth 2 (two) times daily.    [provider]  haloperidol (HALDOL) 0.5 MG tablet Take 0.5 mg by mouth 2 (two) times daily as needed for agitation.    [provider]  methimazole  (TAPAZOLE ) 5 MG tablet Take 5 mg by mouth in the morning.    [provider]  mirtazapine  (REMERON ) 15 MG tablet Take 15 mg by mouth at bedtime.    [provider]  PRESCRIPTION MEDICATION Apply topically 2 (two) times daily as needed (psychotic agitation). ABH gel 2/25/2  4 clicks (1 ml) topically twice daily as needed    [provider]   simvastatin  (ZOCOR ) 40 MG tablet Take 40 mg by mouth daily.    [provider]  traMADol  (ULTRAM ) 50 MG tablet Take 50 mg by mouth 2 (two) times daily.    [provider]  venlafaxine  (EFFEXOR ) 75 MG tablet Take 75 mg by mouth in the morning.    [provider]  venlafaxine  XR (EFFEXOR -XR) 150 MG 24 hr capsule Take 150 mg by mouth in the morning.    [provider]      Allergies    Sertraline    Review of Systems   Review of Systems  Physical Exam Updated Vital Signs BP 131/79   Pulse 76   Temp 98.5 F (36.9 C) (Oral)   Resp 16   Ht 5\' 1"  (1.549 m)   Wt 65 kg   SpO2 97%   BMI 27.08 kg/m  Physical Exam Constitutional:      Comments: Patient is alert.  No acute distress.  No respiratory distress.  HENT:     Head:     Comments: Patient has a very minor few millimeters of brow laceration nonbleeding nongaping to the right brow.  She has a slightly older contusion to the left forehead.  No significant periorbital hematoma or large swelling of the periorbital area.    Right Ear: Tympanic membrane normal.     Left Ear: Tympanic membrane normal.     Nose: Nose normal.     Mouth/Throat:  Mouth: Mucous membranes are moist.     Pharynx: Oropharynx is clear.  Eyes:     Extraocular Movements: Extraocular movements intact.  Cardiovascular:     Rate and Rhythm: Normal rate and regular rhythm.  Pulmonary:     Effort: Pulmonary effort is normal.     Breath sounds: Normal breath sounds.  Abdominal:     General: There is no distension.     Palpations: Abdomen is soft.     Tenderness: There is no abdominal tenderness. There is no guarding.  Musculoskeletal:        General: Normal range of motion.     Cervical back: Neck supple.     Comments: Patient can spontaneously move both upper extremities.  She denies any pain performing range of motion at the shoulders elbows and wrists.  No evident deformities.  Patient does have some minor abrasion to  the left knee and minor tenderness and swelling to the patellar area of the right knee.  There is no effusion present.  No deformity.  Patient can put both of these extremities to range of motion bending spontaneous at the hip knee and ankle.  Skin:    General: Skin is warm and dry.  Neurological:     Comments: Patient is alert.  She has no acute distress.  She is verbally interactive.  No focal motor deficits.  Movements in the stretcher are coordinated and purposeful.  Psychiatric:        Mood and Affect: Mood normal.     ED Results / Procedures / Treatments   Labs (all labs ordered are listed, but only abnormal results are displayed) Labs Reviewed  BASIC METABOLIC PANEL WITH GFR - Abnormal; Notable for the following components:      Result Value   Glucose, Bld 102 (*)    All other components within normal limits  VALPROIC ACID  LEVEL - Abnormal; Notable for the following components:   Valproic Acid  Lvl 34 (*)    All other components within normal limits  URINALYSIS, ROUTINE W REFLEX MICROSCOPIC - Abnormal; Notable for the following components:   Hgb urine dipstick MODERATE (*)    Ketones, ur 5 (*)    Protein, ur 30 (*)    Leukocytes,Ua TRACE (*)    All other components within normal limits  CBG MONITORING, ED - Abnormal; Notable for the following components:   Glucose-Capillary 131 (*)    All other components within normal limits  CBC    EKG EKG Interpretation Date/Time:  Monday October 08 2023 09:53:55 EDT Ventricular Rate:  82 PR Interval:  141 QRS Duration:  97 QT Interval:  367 QTC Calculation: 429 R Axis:   38  Text Interpretation: Sinus rhythm Abnormal R-wave progression, early transition no sig change from previous Confirmed by Wynetta Heckle [phone]) on 10/08/2023 11:39:52 AM  Radiology DG Knee Complete 4 Views Right Result Date: 10/08/2023 CLINICAL DATA:  Pain after fall. EXAM: RIGHT KNEE - COMPLETE 4+ VIEW COMPARISON:  10/04/2023 FINDINGS: No evidence of fracture,  dislocation, or joint effusion. Alignment and joint spaces are normal no evidence of arthropathy or other focal bone abnormality. Prepatellar soft tissue thickening. No radiopaque foreign body or soft tissue gas. IMPRESSION: Prepatellar soft tissue thickening. No fracture or subluxation of the right knee. Electronically Signed   By: Chadwick Colonel M.D.   On: 10/08/2023 13:30   DG Knee Complete 4 Views Left Result Date: 10/08/2023 CLINICAL DATA:  Pain, fall. EXAM: LEFT KNEE - COMPLETE 4+ VIEW COMPARISON:  06/05/2023 FINDINGS: No evidence of fracture, dislocation, or joint effusion. The alignment and joint spaces are normal. No evidence of arthropathy or other focal bone abnormality. Soft tissues are unremarkable. IMPRESSION: Negative radiographs of the left knee. Electronically Signed   By: Chadwick Colonel M.D.   On: 10/08/2023 13:29   CT Head Wo Contrast Result Date: 10/08/2023 CLINICAL DATA:  Head trauma, minor (Age >= 65y) Fall with laceration on right eyebrow. EXAM: CT HEAD WITHOUT CONTRAST TECHNIQUE: Contiguous axial images were obtained from the base of the skull through the vertex without intravenous contrast. RADIATION DOSE REDUCTION: This exam was performed according to the departmental dose-optimization program which includes automated exposure control, adjustment of the mA and/or kV according to patient size and/or use of iterative reconstruction technique. COMPARISON:  Head CT 5 days ago 10/04/2023, this is the patient's fifth head CT in the last 2 and half weeks FINDINGS: Brain: No intracranial hemorrhage, mass effect, or midline shift. Stable atrophy and chronic small vessel ischemia. No hydrocephalus. Again noted incidental cavum septum pellucidum, variant anatomy. The basilar cisterns are patent. No evidence of territorial infarct or acute ischemia. No extra-axial or intracranial fluid collection. Vascular: Atherosclerosis of skullbase vasculature without hyperdense vessel or abnormal  calcification. Skull: No fracture or focal lesion. Sinuses/Orbits: Paranasal sinuses and mastoid air cells are clear. The visualized orbits are unremarkable. Other: Slight soft tissue thickening adjacent to the right orbit. No radiopaque foreign body. IMPRESSION: 1. Slight soft tissue thickening adjacent to the right orbit. No facial bone fracture or radiopaque foreign body. 2. No acute intracranial abnormality. No skull fracture. 3. Stable atrophy and chronic small vessel ischemia. Electronically Signed   By: Chadwick Colonel M.D.   On: 10/08/2023 13:28    Procedures Procedures    Medications Ordered in ED Medications  bacitracin  ointment (1 Application Topical Given 10/08/23 1327)  acetaminophen  (TYLENOL ) tablet 1,000 mg (1,000 mg Oral Given 10/08/23 1221)  bacitracin  500 UNIT/GM ointment (1 Application  Given 10/08/23 1324)    ED Course/ Medical Decision Making/ A&P                                 Medical Decision Making Amount and/or Complexity of Data Reviewed Labs: ordered. Radiology: ordered.  Risk OTC drugs.   Patient has had multiple recurrent falls at memory care.  She does have known history of cognitive impairment with dementia and behavioral disorder.  This has been progressive and longstanding going back a minimum of 8 years.  Today patient is clinically well in appearance.  As she has had many recent falls will obtain basic lab work including Depakote  level to rule out any metabolic disorder.  Will obtain CT scan as patient has now had recurrent falls since yesterday as well.  CT head reviewed by radiology no acute findings.  X-rays of the knees and [place] radiology no acute fractures or effusions.  Valproic acid  level 34, CBC normal, basic metabolic panel normal, urinalysis 21-50 RBCs 0-5 WBC.  At this time no acute findings to suggest metabolic derangement or intercurrent illness contributing to frequent falls.  CT head does not show any acute associated intracranial  injury.  Physical exam and x-rays do not suggest any fracture.  At this time patient is stable for discharge.        Final Clinical Impression(s) / ED Diagnoses Final diagnoses:  Fall, initial encounter  Injury of head, initial encounter  Contusion of face, initial  encounter  Facial laceration, initial encounter  Contusion of knee, unspecified laterality, initial encounter    Rx / DC Orders ED Discharge Orders     None         Wynetta Heckle, MD 10/08/23 1506

## 2023-10-08 NOTE — ED Notes (Signed)
 [facility] at bedside. Patient appears awake, alert and confused. Breathing even and unlabored.

## 2023-10-08 NOTE — ED Notes (Signed)
 Assisted pt to restroom per wheelchair. No signs of distress.

## 2023-10-08 NOTE — ED Notes (Signed)
 Pt was place on a bed alarm by the nurse and tech

## 2023-10-08 NOTE — ED Notes (Signed)
 Bacitracin  ointment applied to right eyebrow and left knee.

## 2023-10-08 NOTE — ED Triage Notes (Signed)
 Bib by ems for fall, laceration on right eyebrow approx 1 cm, bleeding controlled at this time. Does not take blood thinners. Patient reports she got dizzy prior to the fall. Oriented to name , DOB and situation. Disoriented to place.  BP=150/90 HR=88 RR=20 Saturation=91% (room air) CBG=149mg /dl

## 2023-10-08 NOTE — Discharge Instructions (Addendum)
 1 there is a minor cut to the forehead.  This does not need sutures.  Keep this clean and dry and apply antibiotic ointment daily. 2.  There are some bruises and abrasions on the patient's knees.  Keep these clean and dry and apply antibiotic ointment.  You may apply ice as well for pain.  X-rays were done and there is nothing broken. 3.  Patient have been multiple falls in the past days to weeks to months.  There needs to be an increase in monitoring, or bedside assistance to avoid serious injury. 4.  Basic lab work has been done, urinalysis obtained, CT scan of the head and x-rays of the knees.  No significantly related abnormalities identified.

## 2023-10-08 NOTE — ED Notes (Signed)
 Called [facility] and spoke to [place]. ETA unknown.

## 2023-10-08 NOTE — ED Provider Notes (Signed)
 [place] [facility] AT [facility] Provider Note   CSN: [id number] Arrival date & time: 10/08/23  2202     History  Chief Complaint  Patient presents with   Fall    Jillian Harmon is a 73 y.o. female with medical history includes seizures, memory difficulty, alcohol withdrawal seizures, stroke in 2007, alcohol induced persisting dementia, anemia, anxiety, gastric ulcer, hypertension, hypothyroid, subdural hematoma, gait instability.  Patient presents to the ED for evaluation of fall.  Reports that she was at her facility in her wheelchair when she reports that her wheelchair tipped over onto its back.  She is unsure what made her wheelchair tipped over.  She reports that she struck the back of her head when this occurred.  She denies loss of consciousness.  She arrives in a c-collar and is complaining of neck pain.  She reports that she has chest pain and when asked where her chest pain is she points to where the c-collar is resting on her chest.  When I remove the c-collar, she states that her chest pain goes away.  She denies shortness of breath.  She denies any lightheadedness, dizziness or weakness.  She did report dizziness to triage nurse but denies this to me.  Denies any abdominal pain.  The patient was seen earlier today for a fall where she had CT head, and plain film imaging of her bilateral knees collected.  The patient imaging was unremarkable.  The patient does have a laceration to the back of her head.  On chart review, the patient tetanus was updated 1 month ago on 08/17/2023.  This is the patient's fifth visit in 16 days for falls.  On 5/15 she was diagnosed with a compression fracture of L1 vertebrae.  Tonight she is complaining of back pain and points to her lumbar spine.  Alert and oriented at baseline.   Fall       Home Medications Prior to Admission medications   Medication Sig Start Date End Date Taking? Authorizing Provider   acetaminophen  (TYLENOL ) 500 MG tablet Take 1,000 mg by mouth every 8 (eight) hours as needed for moderate pain (pain score 4-6) or headache.    [provider]  dextromethorphan-guaiFENesin  (ROBITUSSIN-DM) 10-100 MG/5ML liquid Take 10 mLs by mouth every 4 (four) hours as needed for cough.    [provider]  diazepam  (VALIUM ) 5 MG tablet Take 1 tablet (5 mg total) by mouth 3 (three) times daily. 04/20/23   Danford, Willis Harter, MD  divalproex  (DEPAKOTE ) 250 MG DR tablet Take 250 mg by mouth 2 (two) times daily. 05/28/23   [provider]  feeding supplement, ENSURE COMPLETE, (ENSURE COMPLETE) LIQD Take 237 mLs by mouth 2 (two) times daily.    [provider]  haloperidol (HALDOL) 0.5 MG tablet Take 0.5 mg by mouth 2 (two) times daily as needed for agitation.    [provider]  methimazole  (TAPAZOLE ) 5 MG tablet Take 5 mg by mouth in the morning.    [provider]  mirtazapine  (REMERON ) 15 MG tablet Take 15 mg by mouth at bedtime.    [provider]  PRESCRIPTION MEDICATION Apply topically 2 (two) times daily as needed (psychotic agitation). ABH gel 2/25/2  4 clicks (1 ml) topically twice daily as needed    [provider]  simvastatin  (ZOCOR ) 40 MG tablet Take 40 mg by mouth daily.    [provider]  traMADol  (ULTRAM ) 50 MG tablet Take 50 mg by  mouth 2 (two) times daily.    [provider]  venlafaxine  (EFFEXOR ) 75 MG tablet Take 75 mg by mouth in the morning.    [provider]  venlafaxine  XR (EFFEXOR -XR) 150 MG 24 hr capsule Take 150 mg by mouth in the morning.    [provider]      Allergies    Sertraline    Review of Systems   Review of Systems  Unable to perform ROS: Dementia (Level 5 caveat)  All other systems reviewed and are negative.   Physical Exam Updated Vital Signs BP (!) 160/85   Pulse 91   Temp 98.2 F (36.8 C) (Oral)   Resp (!) 21   Ht 5\' 1"  (1.549 m)    Wt 65 kg   SpO2 96%   BMI 27.08 kg/m  Physical Exam Vitals and nursing note reviewed.  Constitutional:      General: She is not in acute distress.    Appearance: She is well-developed.  HENT:     Head: Normocephalic.     Comments: Superficial laceration superior to right orbit.  This was apparently suffered earlier today.  Band-Aid in place.  3 cm laceration to posterior scalp. Eyes:     Conjunctiva/sclera: Conjunctivae normal.     Pupils: Pupils are equal, round, and reactive to light.     Comments: Pupils PERRL  Neck:     Comments: Patient is in c-collar Cardiovascular:     Rate and Rhythm: Normal rate and regular rhythm.     Heart sounds: No murmur heard. Pulmonary:     Effort: Pulmonary effort is normal. No respiratory distress.     Breath sounds: Normal breath sounds.  Abdominal:     Palpations: Abdomen is soft.     Tenderness: There is no abdominal tenderness.  Musculoskeletal:        General: No swelling.     Cervical back: Neck supple.       Back:  Skin:    General: Skin is warm and dry.     Capillary Refill: Capillary refill takes less than 2 seconds.  Neurological:     Mental Status: She is alert and oriented to person, place, and time. Mental status is at baseline.     Comments: 5 out of 5 strength bilateral lower extremities.  Follows commands appropriately.  CN III through XII intact.  Alert to baseline.  Tracks across midline.  No slurred speech or facial droop.  5 out of 5 strength bilateral upper extremities.  Psychiatric:        Mood and Affect: Mood normal.     ED Results / Procedures / Treatments   Labs (all labs ordered are listed, but only abnormal results are displayed) Labs Reviewed  CBG MONITORING, ED - Abnormal; Notable for the following components:      Result Value   Glucose-Capillary 105 (*)    All other components within normal limits  CBC  BASIC METABOLIC PANEL WITH GFR  TROPONIN I (HIGH SENSITIVITY)    EKG EKG  Interpretation Date/Time:  Monday October 08 2023 22:05:21 EDT Ventricular Rate:  73 PR Interval:  152 QRS Duration:  82 QT Interval:  384 QTC Calculation: 424 R Axis:   48  Text Interpretation: Sinus rhythm Abnormal R-wave progression, early transition Confirmed by Donita Furrow ([id number]) on 10/09/2023 1:02:15 AM  Radiology CT Lumbar Spine Wo Contrast Result Date: 10/08/2023 EXAM: CT OF THE LUMBAR SPINE WITHOUT CONTRAST 10/08/2023 11:26:00 PM TECHNIQUE: CT of the lumbar  spine was performed without the administration of intravenous contrast. Multiplanar reformatted images are provided for review. Automated exposure control, iterative reconstruction, and/or weight based adjustment of the mA/kV was utilized to reduce the radiation dose to as low as reasonably achievable. COMPARISON: CT abdomen/pelvis dated 10/04/2023 and CT lumbar spine dated 09/20/2023. CLINICAL HISTORY: Back trauma, no prior imaging (Age >= 16y). Multiple falls. FINDINGS: BONES AND ALIGNMENT: Mild superior endplate compression fracture deformities at L1 and L2, with associated schmorl's node deformities, unchanged from priors. No retropulsion. DEGENERATIVE CHANGES: Mild degenerative changes, most prominent at L5-S1. SOFT TISSUES: No paraspinal hematoma. LIMITED RETROPERITONEUM: Cholecystectomy clips. Vascular calcifications. IMPRESSION: 1. Mild superior endplate compression fracture deformities at L1 and L2, chronic. No retropulsion. Electronically signed by: Zadie Herter MD 10/08/2023 11:39 PM EDT RP Workstation: [id number]   DG Chest Portable 1 View Result Date: 10/08/2023 CLINICAL DATA:  Chest pain EXAM: PORTABLE CHEST 1 VIEW COMPARISON:  09/26/2023 FINDINGS: Shallow inspiration with linear atelectasis in the lung bases. Heart size and pulmonary vascularity are normal. No pleural effusion or pneumothorax. Mediastinal contours appear intact. Old bilateral rib fractures. Degenerative changes in the spine. Surgical clips in the right  upper quadrant. IMPRESSION: Shallow inspiration with linear atelectasis in the lung bases. No focal consolidation. Electronically Signed   By: Boyce Byes M.D.   On: 10/08/2023 23:24   CT Head Wo Contrast Result Date: 10/08/2023 EXAM: CT HEAD AND CERVICAL SPINE 10/04/2023 TECHNIQUE: CT of the head and cervical spine was performed without the administration of intravenous contrast. Multiplanar reformatted images are provided for review. Automated exposure control, iterative reconstruction, and/or weight based adjustment of the mA/kV was utilized to reduce the radiation dose to as low as reasonably achievable. COMPARISON: CT head earlier today and CT cervical spine 10/04/2023. CLINICAL HISTORY: FINDINGS: HEAD: BRAIN AND VENTRICLES: Global cortical atrophy. Subcortical and periventricular small vessel ischemic changes. Secondary ventriculomegaly, stable. No acute intracranial hemorrhage, mass effect or midline shift. No abnormal extra-axial fluid collection. The gray-white differentiation is maintained without an acute infarct. ORBITS: Mild soft tissue swelling along the lateral right orbit (image 9). The visualized portion of the orbits demonstrate no acute abnormality. SINUSES: The visualized paranasal sinuses and mastoid air cells demonstrate no acute abnormality. SOFT TISSUES AND SKULL: No acute abnormality of the visualized skull or soft tissues. CERVICAL SPINE: BONES AND ALIGNMENT: There is no acute fracture or traumatic malalignment. DEGENERATIVE CHANGES: Mild degenerative changes of the mid to lower cervical spine. SOFT TISSUES: There is no prevertebral soft tissue swelling. VASCULATURE: Mild intracranial atherosclerosis. IMPRESSION: 1. No acute intracranial abnormality. Atrophy with secondary ventriculomegaly. 2. No acute fracture to the cervical spine. Mild degenerative changes. Electronically signed by: Zadie Herter MD 10/08/2023 11:19 PM EDT RP Workstation: [id number]   CT Cervical Spine Wo  Contrast Result Date: 10/08/2023 EXAM: CT HEAD AND CERVICAL SPINE 10/04/2023 TECHNIQUE: CT of the head and cervical spine was performed without the administration of intravenous contrast. Multiplanar reformatted images are provided for review. Automated exposure control, iterative reconstruction, and/or weight based adjustment of the mA/kV was utilized to reduce the radiation dose to as low as reasonably achievable. COMPARISON: CT head earlier today and CT cervical spine 10/04/2023. CLINICAL HISTORY: FINDINGS: HEAD: BRAIN AND VENTRICLES: Global cortical atrophy. Subcortical and periventricular small vessel ischemic changes. Secondary ventriculomegaly, stable. No acute intracranial hemorrhage, mass effect or midline shift. No abnormal extra-axial fluid collection. The gray-white differentiation is maintained without an acute infarct. ORBITS: Mild soft tissue swelling along the lateral right orbit (image 9). The visualized  portion of the orbits demonstrate no acute abnormality. SINUSES: The visualized paranasal sinuses and mastoid air cells demonstrate no acute abnormality. SOFT TISSUES AND SKULL: No acute abnormality of the visualized skull or soft tissues. CERVICAL SPINE: BONES AND ALIGNMENT: There is no acute fracture or traumatic malalignment. DEGENERATIVE CHANGES: Mild degenerative changes of the mid to lower cervical spine. SOFT TISSUES: There is no prevertebral soft tissue swelling. VASCULATURE: Mild intracranial atherosclerosis. IMPRESSION: 1. No acute intracranial abnormality. Atrophy with secondary ventriculomegaly. 2. No acute fracture to the cervical spine. Mild degenerative changes. Electronically signed by: Zadie Herter MD 10/08/2023 11:19 PM EDT RP Workstation: [id number]   DG Knee Complete 4 Views Right Result Date: 10/08/2023 CLINICAL DATA:  Pain after fall. EXAM: RIGHT KNEE - COMPLETE 4+ VIEW COMPARISON:  10/04/2023 FINDINGS: No evidence of fracture, dislocation, or joint effusion. Alignment  and joint spaces are normal no evidence of arthropathy or other focal bone abnormality. Prepatellar soft tissue thickening. No radiopaque foreign body or soft tissue gas. IMPRESSION: Prepatellar soft tissue thickening. No fracture or subluxation of the right knee. Electronically Signed   By: Chadwick Colonel M.D.   On: 10/08/2023 13:30   DG Knee Complete 4 Views Left Result Date: 10/08/2023 CLINICAL DATA:  Pain, fall. EXAM: LEFT KNEE - COMPLETE 4+ VIEW COMPARISON:  06/05/2023 FINDINGS: No evidence of fracture, dislocation, or joint effusion. The alignment and joint spaces are normal. No evidence of arthropathy or other focal bone abnormality. Soft tissues are unremarkable. IMPRESSION: Negative radiographs of the left knee. Electronically Signed   By: Chadwick Colonel M.D.   On: 10/08/2023 13:29   CT Head Wo Contrast Result Date: 10/08/2023 CLINICAL DATA:  Head trauma, minor (Age >= 65y) Fall with laceration on right eyebrow. EXAM: CT HEAD WITHOUT CONTRAST TECHNIQUE: Contiguous axial images were obtained from the base of the skull through the vertex without intravenous contrast. RADIATION DOSE REDUCTION: This exam was performed according to the departmental dose-optimization program which includes automated exposure control, adjustment of the mA and/or kV according to patient size and/or use of iterative reconstruction technique. COMPARISON:  Head CT 5 days ago 10/04/2023, this is the patient's fifth head CT in the last 2 and half weeks FINDINGS: Brain: No intracranial hemorrhage, mass effect, or midline shift. Stable atrophy and chronic small vessel ischemia. No hydrocephalus. Again noted incidental cavum septum pellucidum, variant anatomy. The basilar cisterns are patent. No evidence of territorial infarct or acute ischemia. No extra-axial or intracranial fluid collection. Vascular: Atherosclerosis of skullbase vasculature without hyperdense vessel or abnormal calcification. Skull: No fracture or focal lesion.  Sinuses/Orbits: Paranasal sinuses and mastoid air cells are clear. The visualized orbits are unremarkable. Other: Slight soft tissue thickening adjacent to the right orbit. No radiopaque foreign body. IMPRESSION: 1. Slight soft tissue thickening adjacent to the right orbit. No facial bone fracture or radiopaque foreign body. 2. No acute intracranial abnormality. No skull fracture. 3. Stable atrophy and chronic small vessel ischemia. Electronically Signed   By: Chadwick Colonel M.D.   On: 10/08/2023 13:28    Procedures .Laceration Repair  Date/Time: 10/09/2023 1:44 AM  Performed by: Adel Aden, PA-C Authorized by: Adel Aden, PA-C   Consent:    Consent obtained:  Verbal   Consent given by:  Patient   Risks, benefits, and alternatives were discussed: yes     Risks discussed:  Infection, need for additional repair, nerve damage, poor cosmetic result, pain, retained foreign body, tendon damage, vascular damage and poor wound healing Universal protocol:  Patient identity confirmed:  Verbally with patient and arm band Anesthesia:    Anesthesia method:  None Laceration details:    Location:  Scalp   Scalp location:  Occipital   Length (cm):  3    Medications Ordered in ED Medications - No data to display  ED Course/ Medical Decision Making/ A&P   Medical Decision Making Amount and/or Complexity of Data Reviewed Labs: ordered. Radiology: ordered.   73 year old female presents for evaluation of fall.  Please see HPI for further details.  On examination the patient is afebrile and nontachycardic.  Her lung sounds are clear bilaterally, she is not hypoxic.  Abdomen soft and compressible.  Neuroexam at baseline.  Patient in c-collar, does endorse neck pain.  Pupils PERRL.  Superficial abrasion noted above right orbit which apparently was suffered earlier today on initial visit to ED.  Patient does have up 3 cm laceration over posterior scalp.  Her tetanus was updated in  April.  Patient also complaining of low back pain.  On chart view, patient was diagnosed with compression fracture of L1 vertebrae on 5/15.  The patient does endorse chest pain but when asked where the chest pain is located she points to where the cervical collar is resting on her chest.  When I remove the cervical collar, she states that chest pain has resolved.  Will collect labs to include CBC, BMP, troponin, CBG.  Will also collect CT lumbar spine, CT head, chest x-ray, CT cervical spine.  Patient lab work largely reassuring.  CBC without leukocytosis or anemia.  Metabolic panel without electrolyte derangement or anion gap elevation.  Troponin 4.  EKG shows sinus rhythm.  Chest x-ray unremarkable.  Patient CT head, CT cervical spine unremarkable for acute process.  CT lumbar spine does show chronic L1-L2 compression fracture deformities.  Patient was diagnosed with L1 compression fracture on 5/15.  At this time, patient had 1 staple placed into her posterior scalp.  This was successfully closed.  Patient will discharge home at this time back to facility.  Stable to discharge.  Final Clinical Impression(s) / ED Diagnoses Final diagnoses:  Fall, initial encounter  Laceration of scalp, initial encounter    Rx / DC Orders ED Discharge Orders     None         Adel Aden, PA-C 10/09/23 0149    Rory Collard, MD 10/09/23 [phone]

## 2023-10-08 NOTE — ED Triage Notes (Signed)
 Pt BIB EMS from [facility]. Per EMS pt has had 3 fall in the past 2 days, rencently d/c today for a fall. Pt denies LOC, but reports that she has been really dizzy throughout the day. Pt reports neck pain, abdominal pain, and a lac to the back of her head from this fall. The bruising and lac above her rt eye was from the previous fall. Pt confused at baseline per EMS. A&O to self.

## 2023-10-08 NOTE — ED Notes (Signed)
 Called Facility and spoke to Teresa-patient is returning to the facility.

## 2023-10-09 LAB — TROPONIN I (HIGH SENSITIVITY): Troponin I (High Sensitivity): 4 ng/L (ref <18)

## 2023-10-09 LAB — BASIC METABOLIC PANEL WITH GFR
Anion gap: 11 (ref 5–15)
BUN: 16 mg/dL (ref 8–23)
CO2: 25 mmol/L (ref 22–32)
Calcium: 9.3 mg/dL (ref 8.9–10.3)
Chloride: 106 mmol/L (ref 98–111)
Creatinine, Ser: 0.86 mg/dL (ref 0.44–1.00)
GFR, Estimated: >60 mL/min (ref >60)
Glucose, Bld: 94 mg/dL (ref 70–99)
Potassium: 4 mmol/L (ref 3.5–5.1)
Sodium: 142 mmol/L (ref 135–145)

## 2023-10-09 LAB — CBC
HCT: 38.5 % (ref 36.0–46.0)
Hemoglobin: 12.3 g/dL (ref 12.0–15.0)
MCH: 29.8 pg (ref 26.0–34.0)
MCHC: 31.9 g/dL (ref 30.0–36.0)
MCV: 93.2 fL (ref 80.0–100.0)
Platelets: 267 10*3/uL (ref 150–400)
RBC: 4.13 MIL/uL (ref 3.87–5.11)
RDW: 14.5 % (ref 11.5–15.5)
WBC: 5.7 10*3/uL (ref 4.0–10.5)
nRBC: 0 % (ref 0.0–0.2)

## 2023-10-09 NOTE — ED Notes (Signed)
 [facility] called

## 2023-10-09 NOTE — Discharge Instructions (Signed)
 It was a pleasure taking part in your care.  As discussed, please follow-up with your facility PCP.  Please have staple removed from the back of your head in 10 days.  Return to ED with any new or worsening symptoms.

## 2023-10-13 ENCOUNTER — Emergency Department (HOSPITAL_COMMUNITY)

## 2023-10-13 ENCOUNTER — Encounter (HOSPITAL_COMMUNITY): Payer: Self-pay

## 2023-10-13 ENCOUNTER — Other Ambulatory Visit: Payer: Self-pay

## 2023-10-13 ENCOUNTER — Emergency Department (HOSPITAL_COMMUNITY)
Admission: EM | Admit: 2023-10-13 | Discharge: 2023-10-14 | Disposition: A | Discharge status: Skilled Nursing Facility | Attending: Emergency Medicine | Admitting: Emergency Medicine

## 2023-10-13 DIAGNOSIS — S0083XA Contusion of other part of head, initial encounter: Secondary | ICD-10-CM | POA: Insufficient documentation

## 2023-10-13 DIAGNOSIS — W01198A Fall on same level from slipping, tripping and stumbling with subsequent striking against other object, initial encounter: Secondary | ICD-10-CM | POA: Diagnosis not present

## 2023-10-13 DIAGNOSIS — Y9301 Activity, walking, marching and hiking: Secondary | ICD-10-CM | POA: Insufficient documentation

## 2023-10-13 DIAGNOSIS — R519 Headache, unspecified: Secondary | ICD-10-CM | POA: Diagnosis present

## 2023-10-13 DIAGNOSIS — M25511 Pain in right shoulder: Secondary | ICD-10-CM | POA: Diagnosis not present

## 2023-10-13 DIAGNOSIS — S0990XA Unspecified injury of head, initial encounter: Secondary | ICD-10-CM

## 2023-10-13 LAB — CBC WITH DIFFERENTIAL/PLATELET
Abs Immature Granulocytes: 0.02 10*3/uL (ref 0.00–0.07)
Basophils Absolute: 0 10*3/uL (ref 0.0–0.1)
Basophils Relative: 1 %
Eosinophils Absolute: 0.1 10*3/uL (ref 0.0–0.5)
Eosinophils Relative: 3 %
HCT: 37.3 % (ref 36.0–46.0)
Hemoglobin: 11.5 g/dL — ABNORMAL LOW (ref 12.0–15.0)
Immature Granulocytes: 0 %
Lymphocytes Relative: 36 %
Lymphs Abs: 1.9 10*3/uL (ref 0.7–4.0)
MCH: 29.5 pg (ref 26.0–34.0)
MCHC: 30.8 g/dL (ref 30.0–36.0)
MCV: 95.6 fL (ref 80.0–100.0)
Monocytes Absolute: 0.4 10*3/uL (ref 0.1–1.0)
Monocytes Relative: 7 %
Neutro Abs: 2.9 10*3/uL (ref 1.7–7.7)
Neutrophils Relative %: 53 %
Platelets: 206 10*3/uL (ref 150–400)
RBC: 3.9 MIL/uL (ref 3.87–5.11)
RDW: 14.6 % (ref 11.5–15.5)
WBC: 5.4 10*3/uL (ref 4.0–10.5)
nRBC: 0 % (ref 0.0–0.2)

## 2023-10-13 LAB — URINALYSIS, ROUTINE W REFLEX MICROSCOPIC
Bilirubin Urine: NEGATIVE
Glucose, UA: NEGATIVE mg/dL
Ketones, ur: NEGATIVE mg/dL
Nitrite: NEGATIVE
Protein, ur: NEGATIVE mg/dL
Specific Gravity, Urine: 1.009 (ref 1.005–1.030)
pH: 7 (ref 5.0–8.0)

## 2023-10-13 LAB — BASIC METABOLIC PANEL WITH GFR
Anion gap: 7 (ref 5–15)
BUN: 16 mg/dL (ref 8–23)
CO2: 25 mmol/L (ref 22–32)
Calcium: 8.8 mg/dL — ABNORMAL LOW (ref 8.9–10.3)
Chloride: 105 mmol/L (ref 98–111)
Creatinine, Ser: 1.12 mg/dL — ABNORMAL HIGH (ref 0.44–1.00)
GFR, Estimated: 53 mL/min — ABNORMAL LOW (ref >60)
Glucose, Bld: 92 mg/dL (ref 70–99)
Potassium: 4.4 mmol/L (ref 3.5–5.1)
Sodium: 137 mmol/L (ref 135–145)

## 2023-10-13 NOTE — ED Notes (Signed)
 [facility] called and transport arranged.

## 2023-10-13 NOTE — ED Provider Notes (Signed)
 [place] EMERGENCY DEPARTMENT AT [facility] Provider Note   CSN: [id number] Arrival date & time: 10/13/23  1558     History  Chief Complaint  Patient presents with   Fall    Jillian Harmon is a 73 y.o. female.  HPI   73 year old female presents to the emergency department from urgent placed with reported fall.  Patient states that she was walking down the hallway, tripped and fell into the wall on the right side.  She slid herself down to the ground onto her bottom.  Did hit her head on the wall, did not lose consciousness.  Mainly complaining of right forehead and right shoulder pain.  She does not believe that she takes blood thinning medication.  Reportedly has had multiple falls recently and complaining of some generalized fatigue.  Home Medications Prior to Admission medications   Medication Sig Start Date End Date Taking? Authorizing Provider  acetaminophen  (TYLENOL ) 500 MG tablet Take 1,000 mg by mouth every 8 (eight) hours as needed for moderate pain (pain score 4-6) or headache.    [provider]  dextromethorphan-guaiFENesin  (ROBITUSSIN-DM) 10-100 MG/5ML liquid Take 10 mLs by mouth every 4 (four) hours as needed for cough.    [provider]  diazepam  (VALIUM ) 5 MG tablet Take 1 tablet (5 mg total) by mouth 3 (three) times daily. 04/20/23   Danford, Willis Harter, MD  divalproex  (DEPAKOTE ) 250 MG DR tablet Take 250 mg by mouth 2 (two) times daily. 05/28/23   [provider]  feeding supplement, ENSURE COMPLETE, (ENSURE COMPLETE) LIQD Take 237 mLs by mouth 2 (two) times daily.    [provider]  haloperidol (HALDOL) 0.5 MG tablet Take 0.5 mg by mouth 2 (two) times daily as needed for agitation.    [provider]  methimazole  (TAPAZOLE ) 5 MG tablet Take 5 mg by mouth in the morning.    [provider]  mirtazapine  (REMERON ) 15 MG tablet Take 15 mg by mouth at bedtime.    [provider]   PRESCRIPTION MEDICATION Apply topically 2 (two) times daily as needed (psychotic agitation). ABH gel 2/25/2  4 clicks (1 ml) topically twice daily as needed    [provider]  simvastatin  (ZOCOR ) 40 MG tablet Take 40 mg by mouth daily.    [provider]  traMADol  (ULTRAM ) 50 MG tablet Take 50 mg by mouth 2 (two) times daily.    [provider]  venlafaxine  (EFFEXOR ) 75 MG tablet Take 75 mg by mouth in the morning.    [provider]  venlafaxine  XR (EFFEXOR -XR) 150 MG 24 hr capsule Take 150 mg by mouth in the morning.    [provider]      Allergies    Sertraline    Review of Systems   Review of Systems  Constitutional:  Negative for fever.  Respiratory:  Negative for shortness of breath.   Cardiovascular:  Negative for chest pain.  Gastrointestinal:  Negative for abdominal pain, diarrhea and vomiting.  Musculoskeletal:  Positive for neck pain.       + right shoulder pain  Skin:  Negative for rash.  Neurological:  Positive for headaches.    Physical Exam Updated Vital Signs BP 132/77 (BP Location: Left Arm)   Pulse 72   Temp 98.4 F (36.9 C) (Oral)   Resp 13   Ht 5\' 1"  (1.549 m)   Wt 65 kg   SpO2 92%   BMI 27.08 kg/m  Physical Exam  Vitals and nursing note reviewed.  Constitutional:      Appearance: Normal appearance.  HENT:     Head: Normocephalic.     Comments: Right frontal hematoma with small skin break, no active bleeding or gaping/laceration    Mouth/Throat:     Mouth: Mucous membranes are moist.  Eyes:     Extraocular Movements: Extraocular movements intact.     Pupils: Pupils are equal, round, and reactive to light.  Neck:     Comments: C-collar in place, no midline spine tenderness Cardiovascular:     Rate and Rhythm: Normal rate.  Pulmonary:     Effort: Pulmonary effort is normal. No respiratory distress.  Chest:     Chest wall: No tenderness.  Abdominal:     Palpations: Abdomen is soft.      Tenderness: There is no abdominal tenderness.  Musculoskeletal:     Comments: Mild tenderness to palpation of the right shoulder with no palpated deformity, clavicle unremarkable  Skin:    General: Skin is warm.  Neurological:     Mental Status: She is alert. Mental status is at baseline.  Psychiatric:        Mood and Affect: Mood normal.     ED Results / Procedures / Treatments   Labs (all labs ordered are listed, but only abnormal results are displayed) Labs Reviewed  CBC WITH DIFFERENTIAL/PLATELET  BASIC METABOLIC PANEL WITH GFR  URINALYSIS, ROUTINE W REFLEX MICROSCOPIC    EKG None  Radiology No results found.  Procedures Procedures    Medications Ordered in ED Medications - No data to display  ED Course/ Medical Decision Making/ A&P                                 Medical Decision Making Amount and/or Complexity of Data Reviewed Labs: ordered. Radiology: ordered.   72 year old female presents emergency department mechanical fall down to the right against a wall.  Sustained a right forehead hematoma, arrives in a c-collar complaining of right-sided head pain, right shoulder/arm pain.  Also endorses worsening generalized fatigue over the past couple days.  Has been seen here frequently for multiple falls.  Blood work is baseline, urinalysis unremarkable.  CT of the head and neck show no traumatic finding.  X-ray imaging shows no fractures.  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  Consulted on-call neurologist, Dr. Murvin Arthurs.  He recommends outpatient follow-up with neurology for proper testing of possible NPH.  I have discussed this with the patient and noted it on her paperwork and given follow-up information for [facility].  She understands to make an appointment at her earliest convenience.  Patient at this time appears safe and stable for discharge and  close outpatient follow up. Discharge plan and strict return to ED precautions discussed, patient verbalizes understanding and agreement.        Final Clinical Impression(s) / ED Diagnoses Final diagnoses:  None    Rx / DC Orders ED Discharge Orders     None         Flonnie Humphrey, DO 10/13/23 2030

## 2023-10-13 NOTE — ED Notes (Signed)
 Patient transported to CT

## 2023-10-13 NOTE — ED Notes (Signed)
 Pt repeatedly getting out of bed and walking into the hallway. Pt repeatedly redirected and fall risk precautions taken.

## 2023-10-13 NOTE — ED Notes (Signed)
 X-ray at bedside

## 2023-10-13 NOTE — ED Triage Notes (Addendum)
 Pt BIBA from [facility], c/o fall. This is her 4th fall this week. Unwitnessed fall and was found face first onto the wall.  Mainly hit the right side.  Has a hematoma on the right side of forehead.  Also c/o lower back pain.  EMS placed a C-collar.  Denies LOC, Thinners.  VSS. A/Ox3  CBG 94

## 2023-10-13 NOTE — Discharge Instructions (Addendum)
 You have been seen and discharged from the emergency department.  Your CT and XR imaging showed no traumatic findings.  There is question of increased fluid in the brain that could be associated with change in equilibrium/balance.  This may be associated with your increased frequency of falls.  I consulted neurology today and they recommend that you follow-up with [facility] as an outpatient to have outpatient testing done for further evaluation of this.  This should be done at your earliest convenience.  Follow-up with your primary provider for further evaluation and further care. Take home medications as prescribed. If you have any worsening symptoms or further concerns for your health please return to an emergency department for further evaluation.

## 2023-10-14 NOTE — ED Notes (Signed)
 Attempted to call report x3 to facility. Unsuccessful

## 2023-10-20 ENCOUNTER — Other Ambulatory Visit: Payer: Self-pay

## 2023-10-20 ENCOUNTER — Emergency Department (HOSPITAL_COMMUNITY)
Admission: EM | Admit: 2023-10-20 | Discharge: 2023-10-20 | Disposition: A | Discharge status: Home / Self Care | Attending: Emergency Medicine | Admitting: Emergency Medicine

## 2023-10-20 ENCOUNTER — Emergency Department (HOSPITAL_COMMUNITY)

## 2023-10-20 DIAGNOSIS — W19XXXA Unspecified fall, initial encounter: Secondary | ICD-10-CM | POA: Diagnosis not present

## 2023-10-20 DIAGNOSIS — S01112A Laceration without foreign body of left eyelid and periocular area, initial encounter: Secondary | ICD-10-CM | POA: Diagnosis not present

## 2023-10-20 DIAGNOSIS — E875 Hyperkalemia: Secondary | ICD-10-CM | POA: Insufficient documentation

## 2023-10-20 DIAGNOSIS — F03911 Unspecified dementia, unspecified severity, with agitation: Secondary | ICD-10-CM

## 2023-10-20 DIAGNOSIS — Z79899 Other long term (current) drug therapy: Secondary | ICD-10-CM | POA: Insufficient documentation

## 2023-10-20 DIAGNOSIS — Z8673 Personal history of transient ischemic attack (TIA), and cerebral infarction without residual deficits: Secondary | ICD-10-CM | POA: Insufficient documentation

## 2023-10-20 DIAGNOSIS — I10 Essential (primary) hypertension: Secondary | ICD-10-CM | POA: Insufficient documentation

## 2023-10-20 DIAGNOSIS — F03A11 Unspecified dementia, mild, with agitation: Secondary | ICD-10-CM | POA: Insufficient documentation

## 2023-10-20 DIAGNOSIS — S0990XA Unspecified injury of head, initial encounter: Secondary | ICD-10-CM | POA: Diagnosis present

## 2023-10-20 DIAGNOSIS — E039 Hypothyroidism, unspecified: Secondary | ICD-10-CM | POA: Diagnosis not present

## 2023-10-20 DIAGNOSIS — S0181XA Laceration without foreign body of other part of head, initial encounter: Secondary | ICD-10-CM

## 2023-10-20 LAB — CBC WITH DIFFERENTIAL/PLATELET
Abs Immature Granulocytes: 0.02 10*3/uL (ref 0.00–0.07)
Basophils Absolute: 0 10*3/uL (ref 0.0–0.1)
Basophils Relative: 0 %
Eosinophils Absolute: 0.1 10*3/uL (ref 0.0–0.5)
Eosinophils Relative: 1 %
HCT: 43.3 % (ref 36.0–46.0)
Hemoglobin: 13.5 g/dL (ref 12.0–15.0)
Immature Granulocytes: 0 %
Lymphocytes Relative: 19 %
Lymphs Abs: 1.6 10*3/uL (ref 0.7–4.0)
MCH: 29.4 pg (ref 26.0–34.0)
MCHC: 31.2 g/dL (ref 30.0–36.0)
MCV: 94.3 fL (ref 80.0–100.0)
Monocytes Absolute: 0.7 10*3/uL (ref 0.1–1.0)
Monocytes Relative: 9 %
Neutro Abs: 5.8 10*3/uL (ref 1.7–7.7)
Neutrophils Relative %: 71 %
Platelets: 213 10*3/uL (ref 150–400)
RBC: 4.59 MIL/uL (ref 3.87–5.11)
RDW: 14.7 % (ref 11.5–15.5)
WBC: 8.3 10*3/uL (ref 4.0–10.5)
nRBC: 0 % (ref 0.0–0.2)

## 2023-10-20 LAB — BASIC METABOLIC PANEL WITH GFR
Anion gap: 12 (ref 5–15)
BUN: 23 mg/dL (ref 8–23)
CO2: 20 mmol/L — ABNORMAL LOW (ref 22–32)
Calcium: 9.1 mg/dL (ref 8.9–10.3)
Chloride: 108 mmol/L (ref 98–111)
Creatinine, Ser: 1.19 mg/dL — ABNORMAL HIGH (ref 0.44–1.00)
GFR, Estimated: 49 mL/min — ABNORMAL LOW (ref >60)
Glucose, Bld: 86 mg/dL (ref 70–99)
Potassium: 5.4 mmol/L — ABNORMAL HIGH (ref 3.5–5.1)
Sodium: 140 mmol/L (ref 135–145)

## 2023-10-20 LAB — URINALYSIS, ROUTINE W REFLEX MICROSCOPIC
Bacteria, UA: NONE SEEN
Bilirubin Urine: NEGATIVE
Glucose, UA: NEGATIVE mg/dL
Ketones, ur: NEGATIVE mg/dL
Leukocytes,Ua: NEGATIVE
Nitrite: NEGATIVE
Protein, ur: NEGATIVE mg/dL
Specific Gravity, Urine: 1.019 (ref 1.005–1.030)
pH: 5 (ref 5.0–8.0)

## 2023-10-20 LAB — TROPONIN I (HIGH SENSITIVITY)
Troponin I (High Sensitivity): 12 ng/L (ref <18)
Troponin I (High Sensitivity): 14 ng/L (ref <18)

## 2023-10-20 MED ORDER — HALOPERIDOL LACTATE 5 MG/ML IJ SOLN
1.0000 mg | Freq: Once | INTRAMUSCULAR | Status: AC
  Filled 2023-10-20: qty 1

## 2023-10-20 MED ORDER — SODIUM CHLORIDE 0.9 % IV BOLUS
1000.0000 mL | Freq: Once | INTRAVENOUS | Status: AC
Start: 2023-10-20 — End: 2023-10-20
  Administered 2023-10-20: 1000 mL via INTRAVENOUS

## 2023-10-20 MED ORDER — LIDOCAINE HCL (PF) 1 % IJ SOLN
5.0000 mL | Freq: Once | INTRAMUSCULAR | Status: AC
Start: 2023-10-20 — End: 2023-10-20
  Filled 2023-10-20: qty 5

## 2023-10-20 NOTE — ED Triage Notes (Signed)
 Bib [facility] from [facility] for unwitnessed fall. Pt reports that she did hit head, no LOC reported. No blood thinners. Pt has had 3 falls over the last few days. Hematoma to left cheek and above left eye. Pt has multiple contusions with multiple stages of healing.   EMS applied c-collar.   EMS VS 136/86 96% ra  84 HR 118 cbg

## 2023-10-20 NOTE — ED Notes (Signed)
 CCMD notified. Pt is on monitor.

## 2023-10-20 NOTE — ED Notes (Signed)
 Report given to the yellow RN.  Pt continues to yell out and threaten staff.

## 2023-10-20 NOTE — ED Notes (Signed)
 Pt getting increasingly agitated and yelling at staff while pulling off her lines and trying to get out of bed.

## 2023-10-20 NOTE — ED Notes (Signed)
 PT bilateral wrist restraints removed. Provider aware and PT resting no redness or problems noted

## 2023-10-20 NOTE — ED Provider Notes (Signed)
 [place] EMERGENCY DEPARTMENT AT [facility] Provider Note   CSN: [id number] Arrival date & time: 10/20/23  1215     Patient presents with: Fall   Jillian Harmon is a 73 y.o. female with past medical history of dementia, seizures, HTN, hypothyroidism, CVA, GIB presents to emergency department for evaluation of head injury following a fall.  Nursing staff reports that patient often attempts to get up from wheelchair causing her to fall frequently.  Today, she had an unwitnessed fall.  Nursing staff reports that they saw her scooting outside of her room into the hallway to get help.  Has been at baseline A&Ox2 before and after fall.  No complaints prior to today. Last tetanus 08/17/23    Fall       Prior to Admission medications   Medication Sig Start Date End Date Taking? Authorizing Provider  acetaminophen  (TYLENOL ) 500 MG tablet Take 1,000 mg by mouth every 8 (eight) hours as needed for moderate pain (pain score 4-6) or headache.    [provider]  dextromethorphan-guaiFENesin  (ROBITUSSIN-DM) 10-100 MG/5ML liquid Take 10 mLs by mouth every 4 (four) hours as needed for cough.    [provider]  diazepam  (VALIUM ) 5 MG tablet Take 1 tablet (5 mg total) by mouth 3 (three) times daily. 04/20/23   Danford, Willis Harter, MD  divalproex  (DEPAKOTE ) 250 MG DR tablet Take 250 mg by mouth 2 (two) times daily. 05/28/23   [provider]  feeding supplement, ENSURE COMPLETE, (ENSURE COMPLETE) LIQD Take 237 mLs by mouth 2 (two) times daily.    [provider]  haloperidol (HALDOL) 0.5 MG tablet Take 0.5 mg by mouth 2 (two) times daily as needed for agitation.    [provider]  methimazole  (TAPAZOLE ) 5 MG tablet Take 5 mg by mouth in the morning.    [provider]  mirtazapine  (REMERON ) 15 MG tablet Take 15 mg by mouth at bedtime.    [provider]  PRESCRIPTION MEDICATION Apply topically 2 (two) times daily as  needed (psychotic agitation). ABH gel 2/25/2  4 clicks (1 ml) topically twice daily as needed    [provider]  simvastatin  (ZOCOR ) 40 MG tablet Take 40 mg by mouth daily.    [provider]  traMADol  (ULTRAM ) 50 MG tablet Take 50 mg by mouth 2 (two) times daily.    [provider]  venlafaxine  (EFFEXOR ) 75 MG tablet Take 75 mg by mouth in the morning.    [provider]  venlafaxine  XR (EFFEXOR -XR) 150 MG 24 hr capsule Take 150 mg by mouth in the morning.    [provider]    Allergies: Sertraline    Review of Systems  Updated Vital Signs BP (!) 142/119   Pulse 88   Temp 98 F (36.7 C)   Resp 18   Ht 5' 1 (1.549 m)   Wt 65 kg   SpO2 98%   BMI 27.08 kg/m   Physical Exam Vitals and nursing note reviewed.  Constitutional:      General: She is not in acute distress.    Appearance: Normal appearance. She is not diaphoretic.  HENT:     Head: Normocephalic and atraumatic.      Comments: Old ecchymosis to right forehead No crepitus to facial bones    Right Ear: External ear normal. No hemotympanum.     Left Ear: External ear normal. No hemotympanum.     Nose: Nose normal.  Right Nostril: No epistaxis or septal hematoma.     Left Nostril: No epistaxis or septal hematoma.     Mouth/Throat:     Mouth: Mucous membranes are moist. No injury or lacerations.   Eyes:     General: No visual field deficit.       Right eye: No discharge.        Left eye: No discharge.     Extraocular Movements: Extraocular movements intact.     Conjunctiva/sclera: Conjunctivae normal.     Pupils: Pupils are equal, round, and reactive to light.     Comments: No subconjunctival hemorrhage, hyphema, tear drop pupil, or fluid leakage bilaterally  Neck:     Vascular: No carotid bruit.     Comments: In c collar. No cervical tenderness Cardiovascular:     Rate and Rhythm: Normal rate.     Pulses: Normal pulses.          Radial pulses are 2+ on the  right side and 2+ on the left side.       Dorsalis pedis pulses are 2+ on the right side and 2+ on the left side.  Pulmonary:     Effort: Pulmonary effort is normal. No respiratory distress.     Breath sounds: Normal breath sounds. No wheezing.  Chest:     Chest wall: No tenderness.  Abdominal:     General: Bowel sounds are normal. There is no distension.     Palpations: Abdomen is soft.     Tenderness: There is no abdominal tenderness. There is no guarding or rebound.     Comments: No ecchymosis nor rigidity to abdomen nor retroperitoneum   Musculoskeletal:     Cervical back: Full passive range of motion without pain, normal range of motion and neck supple. No deformity, rigidity or bony tenderness. Normal range of motion.     Thoracic back: No deformity or bony tenderness. Normal range of motion.     Lumbar back: No deformity or bony tenderness. Normal range of motion.     Right hip: No bony tenderness or crepitus.     Left hip: No bony tenderness or crepitus.     Right lower leg: No edema.     Left lower leg: No edema.     Comments: No obvious deformity to joints or long bones Pelvis stable with no shortening or rotation of LE bilaterally Moving all extremities easily and wo difficulty   Skin:    General: Skin is warm and dry.     Capillary Refill: Capillary refill takes less than 2 seconds.   Neurological:     General: No focal deficit present.     Mental Status: She is alert. Mental status is at baseline.     GCS: GCS eye subscore is 4. GCS verbal subscore is 4. GCS motor subscore is 6.     Cranial Nerves: Cranial nerves 2-12 are intact. No cranial nerve deficit, dysarthria or facial asymmetry.     Sensory: Sensation is intact. No sensory deficit.     Motor: Motor function is intact. No weakness, tremor, abnormal muscle tone, seizure activity or pronator drift.     Coordination: Coordination is intact. Coordination normal. Finger-Nose-Finger Test and Heel to [facility] Test normal.      Deep Tendon Reflexes: Reflexes are normal and symmetric. Reflexes normal.     Comments: A&Ox2 per baseline from dementia. Not alert to current time. following commands appropriately   (all labs ordered are listed, but only abnormal results  are displayed) Labs Reviewed  BASIC METABOLIC PANEL WITH GFR - Abnormal; Notable for the following components:      Result Value   Potassium 5.4 (*)    CO2 20 (*)    Creatinine, Ser 1.19 (*)    GFR, Estimated 49 (*)    All other components within normal limits  URINALYSIS, ROUTINE W REFLEX MICROSCOPIC - Abnormal; Notable for the following components:   Hgb urine dipstick MODERATE (*)    All other components within normal limits  CBC WITH DIFFERENTIAL/PLATELET  TROPONIN I (HIGH SENSITIVITY)  TROPONIN I (HIGH SENSITIVITY)    EKG: None  Radiology: CT Head Wo Contrast Result Date: 10/20/2023 CLINICAL DATA:  Head trauma, moderate-severe; Neck trauma (Age >= 65y); Facial trauma, blunt EXAM: CT HEAD WITHOUT CONTRAST CT MAXILLOFACIAL WITHOUT CONTRAST CT CERVICAL SPINE WITHOUT CONTRAST TECHNIQUE: Multidetector CT imaging of the head, cervical spine, and maxillofacial structures were performed using the standard protocol without intravenous contrast. Multiplanar CT image reconstructions of the cervical spine and maxillofacial structures were also generated. RADIATION DOSE REDUCTION: This exam was performed according to the departmental dose-optimization program which includes automated exposure control, adjustment of the mA and/or kV according to patient size and/or use of iterative reconstruction technique. COMPARISON:  CT head, max face, C-spine 10/13/2023 FINDINGS: CT HEAD FINDINGS Brain: Stable prominence of the lateral ventricles may be related to central predominant atrophy, although a component of normal pressure/communicating hydrocephalus cannot be excluded. Patchy and confluent areas of decreased attenuation are noted throughout the deep and  periventricular white matter of the cerebral hemispheres bilaterally, compatible with chronic microvascular ischemic disease. No evidence of large-territorial acute infarction. No parenchymal hemorrhage. No mass lesion. No extra-axial collection. No mass effect or midline shift. No hydrocephalus. Basilar cisterns are patent. Vascular: No hyperdense vessel. Skull: No acute fracture or focal lesion. Other: None. CT MAXILLOFACIAL FINDINGS Osseous: No fracture or mandibular dislocation. Old healed nasal bone fractures no destructive process. Sinuses/Orbits: Paranasal sinuses and mastoid air cells are clear. The orbits are unremarkable. Soft tissues: Left maxillary hematoma formation. CT CERVICAL SPINE FINDINGS Alignment: Normal. Skull base and vertebrae: Multilevel moderate degenerative changes of the spine. Severe osseous neural foraminal stenosis of the left C3-C4 and right C4-C5 neural foramina. No severe osseous central canal stenosis. No acute fracture. No aggressive appearing focal osseous lesion or focal pathologic process. Soft tissues and spinal canal: No prevertebral fluid or swelling. No visible canal hematoma. Upper chest: Unremarkable. Other: None. IMPRESSION: 1. No acute intracranial abnormality. 2.  No acute displaced facial fracture. 3. No acute displaced fracture or traumatic listhesis of the cervical spine. 4. Severe osseous neural foraminal stenosis of the left C3-C4 and right C4-C5 neural foramina. 5. Stable prominence of the lateral ventricles may be related to central predominant atrophy, although a component of normal pressure/communicating hydrocephalus cannot be excluded. Electronically Signed   By: Morgane  Naveau M.D.   On: 10/20/2023 14:25   CT Cervical Spine Wo Contrast Result Date: 10/20/2023 CLINICAL DATA:  Head trauma, moderate-severe; Neck trauma (Age >= 65y); Facial trauma, blunt EXAM: CT HEAD WITHOUT CONTRAST CT MAXILLOFACIAL WITHOUT CONTRAST CT CERVICAL SPINE WITHOUT CONTRAST  TECHNIQUE: Multidetector CT imaging of the head, cervical spine, and maxillofacial structures were performed using the standard protocol without intravenous contrast. Multiplanar CT image reconstructions of the cervical spine and maxillofacial structures were also generated. RADIATION DOSE REDUCTION: This exam was performed according to the departmental dose-optimization program which includes automated exposure control, adjustment of the mA and/or kV according to patient  size and/or use of iterative reconstruction technique. COMPARISON:  CT head, max face, C-spine 10/13/2023 FINDINGS: CT HEAD FINDINGS Brain: Stable prominence of the lateral ventricles may be related to central predominant atrophy, although a component of normal pressure/communicating hydrocephalus cannot be excluded. Patchy and confluent areas of decreased attenuation are noted throughout the deep and periventricular white matter of the cerebral hemispheres bilaterally, compatible with chronic microvascular ischemic disease. No evidence of large-territorial acute infarction. No parenchymal hemorrhage. No mass lesion. No extra-axial collection. No mass effect or midline shift. No hydrocephalus. Basilar cisterns are patent. Vascular: No hyperdense vessel. Skull: No acute fracture or focal lesion. Other: None. CT MAXILLOFACIAL FINDINGS Osseous: No fracture or mandibular dislocation. Old healed nasal bone fractures no destructive process. Sinuses/Orbits: Paranasal sinuses and mastoid air cells are clear. The orbits are unremarkable. Soft tissues: Left maxillary hematoma formation. CT CERVICAL SPINE FINDINGS Alignment: Normal. Skull base and vertebrae: Multilevel moderate degenerative changes of the spine. Severe osseous neural foraminal stenosis of the left C3-C4 and right C4-C5 neural foramina. No severe osseous central canal stenosis. No acute fracture. No aggressive appearing focal osseous lesion or focal pathologic process. Soft tissues and spinal  canal: No prevertebral fluid or swelling. No visible canal hematoma. Upper chest: Unremarkable. Other: None. IMPRESSION: 1. No acute intracranial abnormality. 2.  No acute displaced facial fracture. 3. No acute displaced fracture or traumatic listhesis of the cervical spine. 4. Severe osseous neural foraminal stenosis of the left C3-C4 and right C4-C5 neural foramina. 5. Stable prominence of the lateral ventricles may be related to central predominant atrophy, although a component of normal pressure/communicating hydrocephalus cannot be excluded. Electronically Signed   By: Morgane  Naveau M.D.   On: 10/20/2023 14:25   CT Maxillofacial Wo Contrast Result Date: 10/20/2023 CLINICAL DATA:  Head trauma, moderate-severe; Neck trauma (Age >= 65y); Facial trauma, blunt EXAM: CT HEAD WITHOUT CONTRAST CT MAXILLOFACIAL WITHOUT CONTRAST CT CERVICAL SPINE WITHOUT CONTRAST TECHNIQUE: Multidetector CT imaging of the head, cervical spine, and maxillofacial structures were performed using the standard protocol without intravenous contrast. Multiplanar CT image reconstructions of the cervical spine and maxillofacial structures were also generated. RADIATION DOSE REDUCTION: This exam was performed according to the departmental dose-optimization program which includes automated exposure control, adjustment of the mA and/or kV according to patient size and/or use of iterative reconstruction technique. COMPARISON:  CT head, max face, C-spine 10/13/2023 FINDINGS: CT HEAD FINDINGS Brain: Stable prominence of the lateral ventricles may be related to central predominant atrophy, although a component of normal pressure/communicating hydrocephalus cannot be excluded. Patchy and confluent areas of decreased attenuation are noted throughout the deep and periventricular white matter of the cerebral hemispheres bilaterally, compatible with chronic microvascular ischemic disease. No evidence of large-territorial acute infarction. No parenchymal  hemorrhage. No mass lesion. No extra-axial collection. No mass effect or midline shift. No hydrocephalus. Basilar cisterns are patent. Vascular: No hyperdense vessel. Skull: No acute fracture or focal lesion. Other: None. CT MAXILLOFACIAL FINDINGS Osseous: No fracture or mandibular dislocation. Old healed nasal bone fractures no destructive process. Sinuses/Orbits: Paranasal sinuses and mastoid air cells are clear. The orbits are unremarkable. Soft tissues: Left maxillary hematoma formation. CT CERVICAL SPINE FINDINGS Alignment: Normal. Skull base and vertebrae: Multilevel moderate degenerative changes of the spine. Severe osseous neural foraminal stenosis of the left C3-C4 and right C4-C5 neural foramina. No severe osseous central canal stenosis. No acute fracture. No aggressive appearing focal osseous lesion or focal pathologic process. Soft tissues and spinal canal: No prevertebral fluid or swelling.  No visible canal hematoma. Upper chest: Unremarkable. Other: None. IMPRESSION: 1. No acute intracranial abnormality. 2.  No acute displaced facial fracture. 3. No acute displaced fracture or traumatic listhesis of the cervical spine. 4. Severe osseous neural foraminal stenosis of the left C3-C4 and right C4-C5 neural foramina. 5. Stable prominence of the lateral ventricles may be related to central predominant atrophy, although a component of normal pressure/communicating hydrocephalus cannot be excluded. Electronically Signed   By: Morgane  Naveau M.D.   On: 10/20/2023 14:25   DG Chest 2 View Result Date: 10/20/2023 CLINICAL DATA:  Pain. EXAM: CHEST - 2 VIEW; PELVIS - 1-2 VIEW COMPARISON:  10/13/2023. FINDINGS: Chest: The heart size and mediastinal contours are within normal limits. Minimal atelectasis is noted at the left lung base. No effusion or pneumothorax is seen. No acute osseous abnormality. Pelvis: 1 there is no acute fracture or dislocation. Mild degenerative changes are present at the hips bilaterally  and in the lower lumbar spine. IMPRESSION: 1. No active cardiopulmonary disease. 2. No acute fracture or dislocation at the pelvis. Electronically Signed   By: Wyvonnia Heimlich M.D.   On: 10/20/2023 13:55   DG Pelvis 1-2 Views Result Date: 10/20/2023 CLINICAL DATA:  Pain. EXAM: CHEST - 2 VIEW; PELVIS - 1-2 VIEW COMPARISON:  10/13/2023. FINDINGS: Chest: The heart size and mediastinal contours are within normal limits. Minimal atelectasis is noted at the left lung base. No effusion or pneumothorax is seen. No acute osseous abnormality. Pelvis: 1 there is no acute fracture or dislocation. Mild degenerative changes are present at the hips bilaterally and in the lower lumbar spine. IMPRESSION: 1. No active cardiopulmonary disease. 2. No acute fracture or dislocation at the pelvis. Electronically Signed   By: Wyvonnia Heimlich M.D.   On: 10/20/2023 13:55     .Laceration Repair  Date/Time: 10/20/2023 5:03 PM  Performed by: Royann Cords, PA Authorized by: Royann Cords, PA   Consent:    Consent obtained:  Verbal   Consent given by:  Patient   Risks, benefits, and alternatives were discussed: yes     Risks discussed:  Infection, pain, poor cosmetic result and poor wound healing   Alternatives discussed: steri strips vs no tx. Universal protocol:    Procedure explained and questions answered to patient or proxy's satisfaction: yes     Imaging studies available: yes     Patient identity confirmed:  Verbally with patient and arm band Anesthesia:    Anesthesia method:  Local infiltration   Local anesthetic:  Lidocaine  1% w/o epi Laceration details:    Location:  Face   Face location:  L eyebrow   Length (cm):  2 Treatment:    Area cleansed with:  Saline   Amount of cleaning:  Standard   Irrigation solution:  Sterile saline   Irrigation volume:  100 Skin repair:    Repair method:  Sutures   Suture size:  3-0   Suture material:  Prolene   Suture technique:  Simple interrupted   Number of  sutures:  4 Approximation:    Approximation:  Close Repair type:    Repair type:  Simple Post-procedure details:    Dressing:  Open (no dressing)    Medications Ordered in the ED  haloperidol lactate (HALDOL) injection 1 mg (has no administration in time range)  sodium chloride  0.9 % bolus 1,000 mL (0 mLs Intravenous Stopped 10/20/23 1643)  lidocaine  (PF) (XYLOCAINE ) 1 % injection 5 mL (5 mLs Other Given by Other 10/20/23  1528)                                    Medical Decision Making Amount and/or Complexity of Data Reviewed Labs: ordered. Radiology: ordered.  Risk Prescription drug management.   Patient presents to the ED for concern of injury following fall, this involves an extensive number of treatment options, and is a complaint that carries with it a high risk of complications and morbidity.  The differential diagnosis includes fracture, contusion, dislocation, laceration, abrasion   Co morbidities that complicate the patient evaluation  See HPI   Additional history obtained:  Additional history obtained from Nursing and Outside Medical Records   External records from outside source obtained and reviewed including triage note, last tetanus on 08/17/2023   Lab Tests:  I Ordered, and personally interpreted labs.  The pertinent results include:   Mild hyperkalemia 5.4 Creatinine 1.19 (was 1.12 seven days ago and 0.86 12 days ago) Troponin WNL UA shows no infection.  Moderate Hgb as per her baseline over past 6 months   Imaging Studies ordered:  I ordered imaging studies including CT head, CT cervical spine, CT maxillofacial, chest x-ray, pelvis x-ray I independently visualized and interpreted imaging which showed no acute traumatic injury to chest or pelvis.  No acute fracture to cervical spine or maxillofacial. CT cervical spine shows Severe osseous neural foraminal stenosis of the left C3-C4 and right C4-C5 neural foramina. CT head shows stable prominence of  lateral ventricles may be related to central predominant atrophy although component of normal pressure/communicated hydrocephalus cannot be excluded I agree with the radiologist interpretation   Cardiac Monitoring:  The patient was maintained on a cardiac monitor.  I personally viewed and interpreted the cardiac monitored which showed an underlying rhythm of: NSR with T wave nor ST abnormalities   Medicines ordered and prescription drug management:  I ordered medication including lidocaine , NS  for lac repair and mildly elevated creatinine and hyperkalemia Reevaluation of the patient after these medicines showed that the patient improved I have reviewed the patients home medicines and have made adjustments as needed    Problem List / ED Course:  Fall Patient at baseline A&Ox2 prior and following fall. Lab work notable for mild hyperkalemia and very small AKI. Remains hemodynamically stable Imaging negative for fracture, ICH Hyperkalemia  No hyperacute T waves on EKG.   Provided IVF Laceration to left forehead Laceration repaired as noted above.  Tolerated well.  No active hemorrhage. Discussed symptomatic treatment at home and provided this on DC paperwork Agitation d/t dementia While waiting for UA, patient becomes very agitated wanting to leave. Multiple attempts were made to console patient, calm patient however she kept attempting to walk out Provided soft restraints for her safety to complete ED workup and call [facility] for ride home. Following this she continues to yell out to leave Is prescribed Haldol PO 0.5mg  to use PRN for agitation. Will provide 1mg  IM for continued agitation   Reevaluation:  After the interventions noted above, I reevaluated the patient and found that they have :improved     Dispostion:  After consideration of the diagnostic results and the patients response to treatment, I feel that the patent would benefit from outpatient management Discussed  ED workup, disposition, return to ED precautions with patient who expresses understanding agrees with plan.  All questions answered to their satisfaction.  They are agreeable to plan.  Discharge instructions provided on paperwork  Discussed patient with Dr. Dolan Freiberg who reviewed workup and agrees with plan  Final diagnoses:  Injury of head, initial encounter  Fall, initial encounter  Facial laceration, initial encounter  Hyperkalemia  Agitation due to dementia [facility])    ED Discharge Orders     None          Royann Cords, PA 10/20/23 1711    Roberts Ching, MD 10/20/23 1909

## 2023-10-20 NOTE — ED Notes (Signed)
 Pt continuing to try and get up. Pulling her lines out. Bed alarm in place but patient not redirectable.  Attempted to get a Recruitment consultant but none available.

## 2023-10-20 NOTE — ED Notes (Signed)
 This NT spoke with pt. NT told pt her behavior and constant yelling would not be tolerated under any circumstances. Pt stated to this NT that A little black girl is trying to kill me. NT told pt that she was safe and no one is trying to harm her. Primary RN aware.

## 2023-10-20 NOTE — Discharge Instructions (Addendum)
 Thank for let us  evaluate you today.  Your imaging of your head, face, neck, chest, pelvis were all negative for acute fracture or bleeding.  Your lab work was notable for mild hyperkalemia and small kidney injury which we have treated with IV fluids  We have also provided 4 stitches for a laceration above your left eye.  These need to come out in 7-10 days.  Please avoid submerging laceration in dirty water .  You may shower like normal.  Return to emergency department if you experience another traumatic injury, altered mentation, seizures, weakness or numbness in one-sided body

## 2023-10-20 NOTE — ED Notes (Signed)
 Pt continues to yell out and pull at lines. Remains in restraints for patient safety.

## 2023-10-20 NOTE — ED Notes (Signed)
[facility] called no ETA given ?

## 2024-02-14 ENCOUNTER — Emergency Department (HOSPITAL_COMMUNITY)
Admission: EM | Admit: 2024-02-14 | Discharge: 2024-02-15 | Disposition: A | Discharge status: Other Acute Inpatient Hospital | Source: Skilled Nursing Facility | Attending: Emergency Medicine | Admitting: Emergency Medicine

## 2024-02-14 ENCOUNTER — Emergency Department (HOSPITAL_COMMUNITY)

## 2024-02-14 DIAGNOSIS — S0181XA Laceration without foreign body of other part of head, initial encounter: Secondary | ICD-10-CM | POA: Diagnosis not present

## 2024-02-14 DIAGNOSIS — Z23 Encounter for immunization: Secondary | ICD-10-CM | POA: Diagnosis not present

## 2024-02-14 DIAGNOSIS — W050XXA Fall from non-moving wheelchair, initial encounter: Secondary | ICD-10-CM | POA: Diagnosis not present

## 2024-02-14 DIAGNOSIS — E871 Hypo-osmolality and hyponatremia: Secondary | ICD-10-CM | POA: Diagnosis not present

## 2024-02-14 DIAGNOSIS — S0990XA Unspecified injury of head, initial encounter: Secondary | ICD-10-CM | POA: Diagnosis present

## 2024-02-14 DIAGNOSIS — E870 Hyperosmolality and hypernatremia: Secondary | ICD-10-CM

## 2024-02-14 DIAGNOSIS — W19XXXA Unspecified fall, initial encounter: Secondary | ICD-10-CM

## 2024-02-14 DIAGNOSIS — F0390 Unspecified dementia without behavioral disturbance: Secondary | ICD-10-CM | POA: Diagnosis not present

## 2024-02-14 LAB — CBC WITH DIFFERENTIAL/PLATELET
Abs Immature Granulocytes: 0.03 K/uL (ref 0.00–0.07)
Basophils Absolute: 0 K/uL (ref 0.0–0.1)
Basophils Relative: 1 %
Eosinophils Absolute: 0.1 K/uL (ref 0.0–0.5)
Eosinophils Relative: 2 %
HCT: 43.5 % (ref 36.0–46.0)
Hemoglobin: 13.4 g/dL (ref 12.0–15.0)
Immature Granulocytes: 1 %
Lymphocytes Relative: 39 %
Lymphs Abs: 2.6 K/uL (ref 0.7–4.0)
MCH: 29.5 pg (ref 26.0–34.0)
MCHC: 30.8 g/dL (ref 30.0–36.0)
MCV: 95.6 fL (ref 80.0–100.0)
Monocytes Absolute: 0.5 K/uL (ref 0.1–1.0)
Monocytes Relative: 8 %
Neutro Abs: 3.4 K/uL (ref 1.7–7.7)
Neutrophils Relative %: 49 %
Platelets: UNDETERMINED K/uL (ref 150–400)
RBC: 4.55 MIL/uL (ref 3.87–5.11)
RDW: 13.8 % (ref 11.5–15.5)
WBC: 6.6 K/uL (ref 4.0–10.5)
nRBC: 0 % (ref 0.0–0.2)

## 2024-02-14 LAB — BASIC METABOLIC PANEL WITH GFR
Anion gap: 11 (ref 5–15)
BUN: 16 mg/dL (ref 8–23)
CO2: 29 mmol/L (ref 22–32)
Calcium: 9.5 mg/dL (ref 8.9–10.3)
Chloride: 110 mmol/L (ref 98–111)
Creatinine, Ser: 0.72 mg/dL (ref 0.44–1.00)
GFR, Estimated: >60 mL/min (ref >60)
Glucose, Bld: 91 mg/dL (ref 70–99)
Potassium: 4 mmol/L (ref 3.5–5.1)
Sodium: 150 mmol/L — ABNORMAL HIGH (ref 135–145)

## 2024-02-14 MED ORDER — LACTATED RINGERS IV BOLUS
1000.0000 mL | Freq: Once | INTRAVENOUS | Status: AC
  Administered 2024-02-14: 1000 mL via INTRAVENOUS

## 2024-02-14 MED ORDER — TETANUS-DIPHTH-ACELL PERTUSSIS 5-2-15.5 LF-MCG/0.5 IM SUSP
0.5000 mL | Freq: Once | INTRAMUSCULAR | Status: AC
  Administered 2024-02-14: 0.5 mL via INTRAMUSCULAR
  Filled 2024-02-14: qty 0.5

## 2024-02-14 MED ORDER — LIDOCAINE-EPINEPHRINE-TETRACAINE (LET) TOPICAL GEL
3.0000 mL | Freq: Once | TOPICAL | Status: AC
  Administered 2024-02-14: 3 mL via TOPICAL
  Filled 2024-02-14: qty 3

## 2024-02-14 NOTE — ED Triage Notes (Signed)
 Pt BIB EMS from [facility] d/t fall from wheelchair; has deep laceration on rt side of forehead. Staff dressed, however, still bleeding. EMS dressed and continues to bleed.   AxO to self but can answer some questions. Not on thinners. No loss of consciousness; refused C collar, able to move all extremities.  BP: 152/70 CBG:142 P:73 O2: 97% RA

## 2024-02-14 NOTE — ED Provider Notes (Signed)
 [place] EMERGENCY DEPARTMENT AT [facility] Provider Note   CSN: [id number] Arrival date & time: 02/14/24  1542     Patient presents with: Fall   Jillian Harmon is a 73 y.o. female.   This is a 73 year old female here today who is here today for a fall.  Patient with a history of dementia, coming from memory care unit.  She had a witnessed fall out of her wheelchair and struck the right side of her forehead.  She denies any other pain or tenderness.  Limited historian.   Fall       Prior to Admission medications   Medication Sig Start Date End Date Taking? Authorizing Provider  acetaminophen  (TYLENOL ) 500 MG tablet Take 1,000 mg by mouth every 8 (eight) hours as needed for moderate pain (pain score 4-6) or headache.    [provider]  dextromethorphan-guaiFENesin  (ROBITUSSIN-DM) 10-100 MG/5ML liquid Take 10 mLs by mouth every 4 (four) hours as needed for cough.    [provider]  diazepam  (VALIUM ) 5 MG tablet Take 1 tablet (5 mg total) by mouth 3 (three) times daily. 04/20/23   Danford, Lonni SQUIBB, MD  divalproex  (DEPAKOTE ) 250 MG DR tablet Take 250 mg by mouth 2 (two) times daily. 05/28/23   [provider]  feeding supplement, ENSURE COMPLETE, (ENSURE COMPLETE) LIQD Take 237 mLs by mouth 2 (two) times daily.    [provider]  haloperidol  (HALDOL ) 0.5 MG tablet Take 0.5 mg by mouth 2 (two) times daily as needed for agitation.    [provider]  methimazole  (TAPAZOLE ) 5 MG tablet Take 5 mg by mouth in the morning.    [provider]  mirtazapine  (REMERON ) 15 MG tablet Take 15 mg by mouth at bedtime.    [provider]  PRESCRIPTION MEDICATION Apply topically 2 (two) times daily as needed (psychotic agitation). ABH gel 2/25/2  4 clicks (1 ml) topically twice daily as needed    [provider]  simvastatin  (ZOCOR ) 40 MG tablet Take 40 mg by mouth daily.    [provider]   traMADol  (ULTRAM ) 50 MG tablet Take 50 mg by mouth 2 (two) times daily.    [provider]  venlafaxine  (EFFEXOR ) 75 MG tablet Take 75 mg by mouth in the morning.    [provider]  venlafaxine  XR (EFFEXOR -XR) 150 MG 24 hr capsule Take 150 mg by mouth in the morning.    [provider]    Allergies: Sertraline    Review of Systems  Updated Vital Signs BP (!) 146/79 (BP Location: Left Arm)   Pulse 74   Temp 98.1 F (36.7 C) (Oral)   Resp 13   SpO2 100%   Physical Exam Vitals and nursing note reviewed.  HENT:     Head: Normocephalic.     Comments: 5.5 cm laceration over the right forehead.  No active bleeding. Eyes:     Pupils: Pupils are equal, round, and reactive to light.  Pulmonary:     Effort: Pulmonary effort is normal.  Abdominal:     General: Abdomen is flat.     Palpations: Abdomen is soft.  Musculoskeletal:        General: Normal range of motion.     Cervical back: Normal range of motion.     Comments: No tenderness to palpation in the bilateral shoulders, upper arms, elbows, forearms or wrists.  No tenderness to palpation in the chest.  Pelvis stable, nontender.  No  tenderness, deformities noted on bilateral upper legs, knees, lower legs or ankles.  Patient able to lift both legs from the bed.  Skin:    General: Skin is warm.  Neurological:     Mental Status: She is alert. Mental status is at baseline.     Comments: Patient able to squeeze my hands with equal bilateral strength, able to lift both legs from the bed.     (all labs ordered are listed, but only abnormal results are displayed) Labs Reviewed  BASIC METABOLIC PANEL WITH GFR - Abnormal; Notable for the following components:      Result Value   Sodium 150 (*)    All other components within normal limits  CBC WITH DIFFERENTIAL/PLATELET  CBC WITH DIFFERENTIAL/PLATELET    EKG: None  Radiology: CT Head Wo Contrast Result Date: 02/14/2024 CLINICAL DATA:  Two falls,  forehead laceration EXAM: CT HEAD WITHOUT CONTRAST CT CERVICAL SPINE WITHOUT CONTRAST TECHNIQUE: Multidetector CT imaging of the head and cervical spine was performed following the standard protocol without intravenous contrast. Multiplanar CT image reconstructions of the cervical spine were also generated. RADIATION DOSE REDUCTION: This exam was performed according to the departmental dose-optimization program which includes automated exposure control, adjustment of the mA and/or kV according to patient size and/or use of iterative reconstruction technique. COMPARISON:  10/20/2023 FINDINGS: CT HEAD FINDINGS Brain: Unchanged dilatation of the lateral and third ventricles with incidental note of septum cavum pellucidum variant of the ventricles. Periventricular white matter hypodensity. No evidence of acute infarction, hemorrhage, extra-axial collection or mass lesion/mass effect. Vascular: No hyperdense vessel or unexpected calcification. Skull: Normal. Negative for fracture or focal lesion. Sinuses/Orbits: No acute finding. Other: Soft tissue laceration of the right forehead with bandage material. CT CERVICAL SPINE FINDINGS Alignment: Degenerative straightening of the normal cervical lordosis. Skull base and vertebrae: No acute fracture. No primary bone lesion or focal pathologic process. Soft tissues and spinal canal: No prevertebral fluid or swelling. No visible canal hematoma. Disc levels:  Moderate multilevel cervical disc degenerative disease Upper chest: Negative. Other: None. IMPRESSION: 1. No acute intracranial pathology. 2. Unchanged dilatation of the lateral and third ventricles with periventricular white matter hypodensity. This may reflect global cerebral volume loss or normal pressure hydrocephalus in the setting of referable signs and symptoms. 3. Soft tissue laceration of the right forehead with bandage material. 4. No fracture or static subluxation of the cervical spine. 5. Moderate multilevel  cervical disc degenerative disease. Electronically Signed   By: Marolyn JONETTA Jaksch M.D.   On: 02/14/2024 17:59   CT Cervical Spine Wo Contrast Result Date: 02/14/2024 CLINICAL DATA:  Two falls, forehead laceration EXAM: CT HEAD WITHOUT CONTRAST CT CERVICAL SPINE WITHOUT CONTRAST TECHNIQUE: Multidetector CT imaging of the head and cervical spine was performed following the standard protocol without intravenous contrast. Multiplanar CT image reconstructions of the cervical spine were also generated. RADIATION DOSE REDUCTION: This exam was performed according to the departmental dose-optimization program which includes automated exposure control, adjustment of the mA and/or kV according to patient size and/or use of iterative reconstruction technique. COMPARISON:  10/20/2023 FINDINGS: CT HEAD FINDINGS Brain: Unchanged dilatation of the lateral and third ventricles with incidental note of septum cavum pellucidum variant of the ventricles. Periventricular white matter hypodensity. No evidence of acute infarction, hemorrhage, extra-axial collection or mass lesion/mass effect. Vascular: No hyperdense vessel or unexpected calcification. Skull: Normal. Negative for fracture or focal lesion. Sinuses/Orbits: No acute finding. Other: Soft tissue laceration of the right forehead with bandage material. CT  CERVICAL SPINE FINDINGS Alignment: Degenerative straightening of the normal cervical lordosis. Skull base and vertebrae: No acute fracture. No primary bone lesion or focal pathologic process. Soft tissues and spinal canal: No prevertebral fluid or swelling. No visible canal hematoma. Disc levels:  Moderate multilevel cervical disc degenerative disease Upper chest: Negative. Other: None. IMPRESSION: 1. No acute intracranial pathology. 2. Unchanged dilatation of the lateral and third ventricles with periventricular white matter hypodensity. This may reflect global cerebral volume loss or normal pressure hydrocephalus in the setting  of referable signs and symptoms. 3. Soft tissue laceration of the right forehead with bandage material. 4. No fracture or static subluxation of the cervical spine. 5. Moderate multilevel cervical disc degenerative disease. Electronically Signed   By: Marolyn JONETTA Jaksch M.D.   On: 02/14/2024 17:59     .Laceration Repair  Date/Time: 02/14/2024 7:12 PM  Performed by: Mannie Fairy DASEN, DO Authorized by: Mannie Fairy DASEN, DO   Consent:    Consent obtained:  Verbal Laceration details:    Location:  Face   Face location:  Forehead   Length (cm):  5.6 Comments:     I anesthetized the area using let gel.  Irrigated with 100 cc of sterile saline.  Using 5-0 Vicryl repeated, used a running suture technique and placed 9 sutures.  Hemostasis achieved, good wound approximation.  Patient Toller procedure well.    Medications Ordered in the ED  lactated ringers  bolus 1,000 mL (has no administration in time range)  Tdap (ADACEL) injection 0.5 mL (has no administration in time range)  lidocaine -EPINEPHrine -tetracaine  (LET) topical gel (3 mLs Topical Given by Other 02/14/24 1813)                                    Medical Decision Making 73 year old female history of dementia here today after a fall from a wheelchair.  Differential diagnoses include  subdural hematoma, intracranial hemorrhage, skull fracture, C-spine fracture.  Plan-obtaining CT imaging of the patient's head and neck.  Will plan to repair the patient's forehead.  Was able to palpate patient's extremities, trunk without any tenderness elicited.  Will check basic blood work on the patient.  No suprapubic tenderness.  Do not believe urine is indicated at this time.  Patient is at baseline.  Reviewed the patient's most recent hospital admission note.  Reassessment 7:10 PM-patient mildly hyponatremic.  Likely due to dehydration.  Provided her with some IV fluids.  Will inform skilled nursing facility to increase p.o. intake over the next few  days and recheck.  My dependent review the patient's head CT shows no intracranial hemorrhage.  CT cervical spine negative.  This patient's health care is complicated by the following social determinants of health-lack of access to primary care, multiple medical comorbidities including dementia.  Amount and/or Complexity of Data Reviewed Labs: ordered. Radiology: ordered.  Risk Prescription drug management.        Final diagnoses:  None    ED Discharge Orders     None          Mannie Fairy DASEN, DO 02/14/24 2015

## 2024-02-14 NOTE — Discharge Instructions (Signed)
 While Jillian Harmon was in the emergency room, she had stitches placed in her forehead.  The stitches will dissolve on their own.  You can apply some antibiotic ointment and a bandage over the area.  It should heal in the next 1 to 2 weeks.  Return to the emergency room if you notice any pus or infection in the area.  Her sodium was a little bit high at 150.  She received some IV fluids.  Make sure that she is getting enough water  to drink over the next few days.  You can recheck her sodium in 1 week.

## 2024-03-08 DEATH — deceased
